# Patient Record
Sex: Female | Born: 1965
Health system: Southern US, Community
[De-identification: ages and names within clinical notes are randomized; demographics above are authoritative.]

## PROBLEM LIST (undated history)

## (undated) DIAGNOSIS — J449 Chronic obstructive pulmonary disease, unspecified: Secondary | ICD-10-CM

## (undated) DIAGNOSIS — Z720 Tobacco use: Secondary | ICD-10-CM

## (undated) DIAGNOSIS — E7800 Pure hypercholesterolemia, unspecified: Secondary | ICD-10-CM

## (undated) DIAGNOSIS — E039 Hypothyroidism, unspecified: Secondary | ICD-10-CM

## (undated) DIAGNOSIS — I10 Essential (primary) hypertension: Secondary | ICD-10-CM

## (undated) DIAGNOSIS — M1990 Unspecified osteoarthritis, unspecified site: Secondary | ICD-10-CM

## (undated) DIAGNOSIS — R079 Chest pain, unspecified: Secondary | ICD-10-CM

## (undated) DIAGNOSIS — R0789 Other chest pain: Secondary | ICD-10-CM

## (undated) HISTORY — DX: Pure hypercholesterolemia, unspecified: E78.00

## (undated) HISTORY — DX: Other chest pain: R07.89

## (undated) HISTORY — DX: Chest pain, unspecified: R07.9

## (undated) HISTORY — PX: OTHER SURGICAL HISTORY: SHX169

---

## 2002-10-23 ENCOUNTER — Encounter: Payer: Self-pay | Admitting: Family Medicine

## 2002-10-23 ENCOUNTER — Ambulatory Visit (HOSPITAL_COMMUNITY): Admission: RE | Admit: 2002-10-23 | Discharge: 2002-10-23 | Payer: Self-pay | Admitting: Family Medicine

## 2004-12-07 ENCOUNTER — Ambulatory Visit (HOSPITAL_COMMUNITY): Admission: RE | Admit: 2004-12-07 | Discharge: 2004-12-07 | Payer: Self-pay | Admitting: Family Medicine

## 2004-12-08 ENCOUNTER — Ambulatory Visit (HOSPITAL_COMMUNITY): Admission: RE | Admit: 2004-12-08 | Discharge: 2004-12-08 | Payer: Self-pay | Admitting: Family Medicine

## 2007-08-01 ENCOUNTER — Ambulatory Visit (HOSPITAL_COMMUNITY): Admission: RE | Admit: 2007-08-01 | Discharge: 2007-08-01 | Payer: Self-pay | Admitting: Family Medicine

## 2007-08-02 ENCOUNTER — Ambulatory Visit (HOSPITAL_COMMUNITY): Admission: RE | Admit: 2007-08-02 | Discharge: 2007-08-02 | Payer: Self-pay | Admitting: Family Medicine

## 2008-08-07 ENCOUNTER — Ambulatory Visit (HOSPITAL_COMMUNITY): Admission: RE | Admit: 2008-08-07 | Discharge: 2008-08-07 | Payer: Self-pay | Admitting: Family Medicine

## 2009-01-27 ENCOUNTER — Other Ambulatory Visit: Admission: RE | Admit: 2009-01-27 | Discharge: 2009-01-27 | Payer: Self-pay | Admitting: Obstetrics and Gynecology

## 2009-02-03 ENCOUNTER — Ambulatory Visit (HOSPITAL_COMMUNITY): Admission: RE | Admit: 2009-02-03 | Discharge: 2009-02-03 | Payer: Self-pay | Admitting: Family Medicine

## 2009-02-17 ENCOUNTER — Ambulatory Visit (HOSPITAL_COMMUNITY): Admission: RE | Admit: 2009-02-17 | Discharge: 2009-02-17 | Payer: Self-pay | Admitting: Family Medicine

## 2009-05-28 ENCOUNTER — Emergency Department (HOSPITAL_COMMUNITY): Admission: EM | Admit: 2009-05-28 | Discharge: 2009-05-28 | Payer: Self-pay | Admitting: Emergency Medicine

## 2009-10-18 ENCOUNTER — Ambulatory Visit (HOSPITAL_COMMUNITY): Admission: RE | Admit: 2009-10-18 | Discharge: 2009-10-18 | Payer: Self-pay | Admitting: Family Medicine

## 2010-06-22 LAB — COMPREHENSIVE METABOLIC PANEL
ALT: 15 U/L (ref 0–35)
Alkaline Phosphatase: 52 U/L (ref 39–117)
BUN: 7 mg/dL (ref 6–23)
CO2: 27 mEq/L (ref 19–32)
Chloride: 105 mEq/L (ref 96–112)
Glucose, Bld: 96 mg/dL (ref 70–99)
Potassium: 4 mEq/L (ref 3.5–5.1)
Total Bilirubin: 0.4 mg/dL (ref 0.3–1.2)

## 2010-06-22 LAB — URINALYSIS, ROUTINE W REFLEX MICROSCOPIC
Glucose, UA: NEGATIVE mg/dL
Ketones, ur: NEGATIVE mg/dL
Specific Gravity, Urine: <1.005 — ABNORMAL LOW (ref 1.005–1.030)
pH: 7 (ref 5.0–8.0)

## 2010-06-22 LAB — CBC
HCT: 44.9 % (ref 36.0–46.0)
Hemoglobin: 15.3 g/dL — ABNORMAL HIGH (ref 12.0–15.0)
RBC: 5.04 MIL/uL (ref 3.87–5.11)
RDW: 13.8 % (ref 11.5–15.5)
WBC: 8.6 10*3/uL (ref 4.0–10.5)

## 2010-06-22 LAB — WET PREP, GENITAL: WBC, Wet Prep HPF POC: NONE SEEN

## 2010-06-22 LAB — LIPASE, BLOOD: Lipase: 23 U/L (ref 11–59)

## 2010-06-22 LAB — PREGNANCY, URINE: Preg Test, Ur: NEGATIVE

## 2011-03-11 ENCOUNTER — Emergency Department (HOSPITAL_COMMUNITY): Payer: BC Managed Care – PPO

## 2011-03-11 ENCOUNTER — Emergency Department (HOSPITAL_COMMUNITY)
Admission: EM | Admit: 2011-03-11 | Discharge: 2011-03-11 | Disposition: A | Discharge status: Home / Self Care | Payer: BC Managed Care – PPO | Attending: Emergency Medicine | Admitting: Emergency Medicine

## 2011-03-11 ENCOUNTER — Encounter: Payer: Self-pay | Admitting: *Deleted

## 2011-03-11 DIAGNOSIS — J449 Chronic obstructive pulmonary disease, unspecified: Secondary | ICD-10-CM | POA: Insufficient documentation

## 2011-03-11 DIAGNOSIS — R112 Nausea with vomiting, unspecified: Secondary | ICD-10-CM | POA: Insufficient documentation

## 2011-03-11 DIAGNOSIS — I10 Essential (primary) hypertension: Secondary | ICD-10-CM | POA: Insufficient documentation

## 2011-03-11 DIAGNOSIS — R51 Headache: Secondary | ICD-10-CM | POA: Insufficient documentation

## 2011-03-11 DIAGNOSIS — E079 Disorder of thyroid, unspecified: Secondary | ICD-10-CM | POA: Insufficient documentation

## 2011-03-11 DIAGNOSIS — F17200 Nicotine dependence, unspecified, uncomplicated: Secondary | ICD-10-CM | POA: Insufficient documentation

## 2011-03-11 DIAGNOSIS — J4489 Other specified chronic obstructive pulmonary disease: Secondary | ICD-10-CM | POA: Insufficient documentation

## 2011-03-11 HISTORY — DX: Essential (primary) hypertension: I10

## 2011-03-11 HISTORY — DX: Chronic obstructive pulmonary disease, unspecified: J44.9

## 2011-03-11 LAB — DIFFERENTIAL
Basophils Absolute: 0 10*3/uL (ref 0.0–0.1)
Eosinophils Relative: 0 % (ref 0–5)
Lymphocytes Relative: 20 % (ref 12–46)
Lymphs Abs: 1.7 10*3/uL (ref 0.7–4.0)
Monocytes Absolute: 0.5 10*3/uL (ref 0.1–1.0)
Neutro Abs: 6.2 10*3/uL (ref 1.7–7.7)

## 2011-03-11 LAB — CBC
HCT: 44.9 % (ref 36.0–46.0)
MCV: 89.4 fL (ref 78.0–100.0)
Platelets: 190 10*3/uL (ref 150–400)
RBC: 5.02 MIL/uL (ref 3.87–5.11)
WBC: 8.5 10*3/uL (ref 4.0–10.5)

## 2011-03-11 LAB — URINALYSIS, ROUTINE W REFLEX MICROSCOPIC
Glucose, UA: NEGATIVE mg/dL
Hgb urine dipstick: NEGATIVE
Leukocytes, UA: NEGATIVE
Protein, ur: NEGATIVE mg/dL
pH: 8 (ref 5.0–8.0)

## 2011-03-11 LAB — BASIC METABOLIC PANEL
CO2: 31 mEq/L (ref 19–32)
Calcium: 10 mg/dL (ref 8.4–10.5)
Chloride: 98 mEq/L (ref 96–112)
Glucose, Bld: 98 mg/dL (ref 70–99)
Sodium: 137 mEq/L (ref 135–145)

## 2011-03-11 MED ORDER — SODIUM CHLORIDE 0.9 % IV BOLUS (SEPSIS)
1000.0000 mL | Freq: Once | INTRAVENOUS | Status: AC
  Administered 2011-03-11: 1000 mL via INTRAVENOUS

## 2011-03-11 MED ORDER — ONDANSETRON HCL 4 MG/2ML IJ SOLN
4.0000 mg | Freq: Once | INTRAMUSCULAR | Status: AC
Start: 2011-03-11 — End: 2011-03-11
  Administered 2011-03-11: 4 mg via INTRAVENOUS
  Filled 2011-03-11: qty 2

## 2011-03-11 MED ORDER — PANTOPRAZOLE SODIUM 40 MG IV SOLR
40.0000 mg | Freq: Once | INTRAVENOUS | Status: AC
  Administered 2011-03-11: 40 mg via INTRAVENOUS
  Filled 2011-03-11: qty 40

## 2011-03-11 MED ORDER — PROMETHAZINE HCL 25 MG PO TABS: 12.5000 mg | ORAL_TABLET | Freq: Four times a day (QID) | ORAL | Status: AC | PRN

## 2011-03-11 NOTE — ED Notes (Signed)
Pt tolerating p.o intake with no nausea.

## 2011-03-11 NOTE — ED Notes (Signed)
Pt tolerating ice chips.  Reports relief of nausea.  Sprite given.

## 2011-03-11 NOTE — ED Provider Notes (Signed)
History     CSN: [id number] Arrival date & time: 03/11/2011  7:51 PM   First MD Initiated Contact with Patient 03/11/11 2007      Chief Complaint  Patient presents with  . Headache  . Nausea  . Emesis    (Consider location/radiation/quality/duration/timing/severity/associated sxs/prior treatment) HPI Comments: Amanda York is a 45 y.o. female who presents to the Emergency Department complaining of nausea and vomiting that began this morning and has continued all day. She has been unable to keep any fluids or food down. She has a similar illness a week ago which lasted 24 hours and was associated with diarrhea. The illness today was not associated with diarrhea, or abdominal pain. She denies fever, chills, chest pain, cough.She has taken no medicines.  Patient is a 45 y.o. female presenting with headaches and vomiting.  Headache  Associated symptoms include vomiting.  Emesis  Associated symptoms include headaches.    Past Medical History  Diagnosis Date  . COPD (chronic obstructive pulmonary disease)   . Hypertension   . Thyroid disease     History reviewed. No pertinent past surgical history.  History reviewed. No pertinent family history.  History  Substance Use Topics  . Smoking status: Current Everyday Smoker -- 1.0 packs/day    Types: Cigarettes  . Smokeless tobacco: Not on file  . Alcohol Use: No    OB History    Grav Para Term Preterm Abortions TAB SAB Ect Mult Living                  Review of Systems  Gastrointestinal: Positive for vomiting.  Neurological: Positive for headaches.  10 Systems reviewed and are negative for acute change except as noted in the HPI.  Allergies  Review of patient's allergies indicates not on file.  Home Medications  No current outpatient prescriptions on file.  BP 119/69  Pulse 92  Temp(Src) 98 F (36.7 C) (Oral)  Resp 18  Ht 5\' 2"  (1.575 m)  Wt 134 lb (60.782 kg)  BMI 24.51 kg/m2  SpO2 98%  Physical Exam    Nursing note and vitals reviewed. Constitutional: She is oriented to person, place, and time. She appears well-developed and well-nourished.  HENT:  Head: Normocephalic and atraumatic.  Mouth/Throat: Oropharynx is clear and moist.  Eyes: Conjunctivae and EOM are normal.  Neck: Normal range of motion.  Cardiovascular: Normal rate, normal heart sounds and intact distal pulses.   Pulmonary/Chest: Effort normal and breath sounds normal.  Abdominal: Soft. Bowel sounds are normal. She exhibits no distension. There is no tenderness. There is no rebound and no guarding.  Musculoskeletal: Normal range of motion.  Neurological: She is alert and oriented to person, place, and time. She has normal reflexes.  Skin: Skin is warm and dry.    ED Course  Procedures (including critical care time)  Results for orders placed during the hospital encounter of 03/11/11  CBC      Component Value Range   WBC 8.5  4.0 - 10.5 (K/uL)   RBC 5.02  3.87 - 5.11 (MIL/uL)   Hemoglobin 15.2 (*) 12.0 - 15.0 (g/dL)   HCT 40.9  81.1 - 91.4 (%)   MCV 89.4  78.0 - 100.0 (fL)   MCH 30.3  26.0 - 34.0 (pg)   MCHC 33.9  30.0 - 36.0 (g/dL)   RDW 78.2  95.6 - 21.3 (%)   Platelets 190  150 - 400 (K/uL)  DIFFERENTIAL      Component Value Range  Neutrophils Relative 73  43 - 77 (%)   Neutro Abs 6.2  1.7 - 7.7 (K/uL)   Lymphocytes Relative 20  12 - 46 (%)   Lymphs Abs 1.7  0.7 - 4.0 (K/uL)   Monocytes Relative 6  3 - 12 (%)   Monocytes Absolute 0.5  0.1 - 1.0 (K/uL)   Eosinophils Relative 0  0 - 5 (%)   Eosinophils Absolute 0.0  0.0 - 0.7 (K/uL)   Basophils Relative 1  0 - 1 (%)   Basophils Absolute 0.0  0.0 - 0.1 (K/uL)  BASIC METABOLIC PANEL      Component Value Range   Sodium 137  135 - 145 (mEq/L)   Potassium 3.8  3.5 - 5.1 (mEq/L)   Chloride 98  96 - 112 (mEq/L)   CO2 31  19 - 32 (mEq/L)   Glucose, Bld 98  70 - 99 (mg/dL)   BUN 6  6 - 23 (mg/dL)   Creatinine, Ser 1.61  0.50 - 1.10 (mg/dL)   Calcium 09.6   8.4 - 10.5 (mg/dL)   GFR calc non Af Amer >90  >90 (mL/min)   GFR calc Af Amer >90  >90 (mL/min)  URINALYSIS, ROUTINE W REFLEX MICROSCOPIC      Component Value Range   Color, Urine YELLOW  YELLOW    APPearance CLEAR  CLEAR    Specific Gravity, Urine 1.015  1.005 - 1.030    pH 8.0  5.0 - 8.0    Glucose, UA NEGATIVE  NEGATIVE (mg/dL)   Hgb urine dipstick NEGATIVE  NEGATIVE    Bilirubin Urine NEGATIVE  NEGATIVE    Ketones, ur NEGATIVE  NEGATIVE (mg/dL)   Protein, ur NEGATIVE  NEGATIVE (mg/dL)   Urobilinogen, UA 0.2  0.0 - 1.0 (mg/dL)   Nitrite NEGATIVE  NEGATIVE    Leukocytes, UA NEGATIVE  NEGATIVE    Dg Abd Acute W/chest  03/11/2011  *RADIOLOGY REPORT*  Clinical Data: Vomiting.  Cough.  ACUTE ABDOMEN SERIES (ABDOMEN 2 VIEW & CHEST 1 VIEW)  Comparison: CT abdomen pelvis 05/28/2009 at [facility]. Two-view chest x-ray 08/07/2008 at [facility].  Findings: The heart size is normal.  Emphysema is again noted.  No focal airspace disease is evident.  Supine and upright views the abdomen demonstrate a nonspecific bowel gas pattern.  There is no evidence for obstruction or free air.  The axial skeleton is unremarkable.  IMPRESSION:  1.  No acute abnormality of the abdomen or chest. 2.  Emphysema.  Original Report Authenticated By: Jamesetta Orleans. MATTERN, M.D.   2145 Nausea is relieved. No further vomiting. Patient  has taken PO ice chips and is trying a PO fluid challenge.     MDM  Patient with nausea and vomiting x one day without fever, chills, abdominal pain. Labs are unremarkable, acute abdomen series without acute findings. Patient received IVF, antiemetic with improvement.Pt feels improved after observation and/or treatment in ED.Pt stable in ED with no significant deterioration in condition.The patient appears reasonably screened and/or stabilized for discharge and I doubt any other medical condition or other [facility] requiring further screening, evaluation, or treatment in the  ED at this time prior to discharge.  MDM Reviewed: nursing note and vitals Interpretation: labs and x-ray           Nicoletta Dress. Colon Branch, MD 03/11/11 2152

## 2011-03-11 NOTE — ED Notes (Addendum)
Pt reports nausea and vomiting beginning today.  States she has been unable to tolerate any p.o intake. Reports the n/v have resulted in a headache as well.

## 2011-03-11 NOTE — ED Notes (Signed)
C/o HA with N/V today, recently sick with virus

## 2011-05-04 ENCOUNTER — Other Ambulatory Visit (HOSPITAL_COMMUNITY): Payer: Self-pay | Admitting: Family Medicine

## 2011-05-04 DIAGNOSIS — Z0000 Encounter for general adult medical examination without abnormal findings: Secondary | ICD-10-CM

## 2011-05-04 DIAGNOSIS — Z139 Encounter for screening, unspecified: Secondary | ICD-10-CM

## 2011-05-08 ENCOUNTER — Ambulatory Visit (HOSPITAL_COMMUNITY)
Admission: RE | Admit: 2011-05-08 | Discharge: 2011-05-08 | Disposition: A | Discharge status: Home / Self Care | Payer: 59 | Source: Ambulatory Visit | Attending: Family Medicine | Admitting: Family Medicine

## 2011-05-08 DIAGNOSIS — Z0000 Encounter for general adult medical examination without abnormal findings: Secondary | ICD-10-CM

## 2011-05-08 DIAGNOSIS — Z1231 Encounter for screening mammogram for malignant neoplasm of breast: Secondary | ICD-10-CM | POA: Insufficient documentation

## 2011-05-08 DIAGNOSIS — Z139 Encounter for screening, unspecified: Secondary | ICD-10-CM

## 2011-05-11 ENCOUNTER — Other Ambulatory Visit (HOSPITAL_COMMUNITY)
Admission: RE | Admit: 2011-05-11 | Discharge: 2011-05-11 | Disposition: A | Discharge status: Home / Self Care | Payer: 59 | Source: Ambulatory Visit | Attending: Obstetrics and Gynecology | Admitting: Obstetrics and Gynecology

## 2011-05-11 ENCOUNTER — Other Ambulatory Visit: Payer: Self-pay | Admitting: Adult Health

## 2011-05-11 DIAGNOSIS — Z01419 Encounter for gynecological examination (general) (routine) without abnormal findings: Secondary | ICD-10-CM | POA: Insufficient documentation

## 2011-05-11 DIAGNOSIS — E041 Nontoxic single thyroid nodule: Secondary | ICD-10-CM

## 2011-05-15 ENCOUNTER — Ambulatory Visit (HOSPITAL_COMMUNITY)
Admission: RE | Admit: 2011-05-15 | Discharge: 2011-05-15 | Disposition: A | Discharge status: Home / Self Care | Payer: 59 | Source: Ambulatory Visit | Attending: Adult Health | Admitting: Adult Health

## 2011-05-15 DIAGNOSIS — E049 Nontoxic goiter, unspecified: Secondary | ICD-10-CM | POA: Insufficient documentation

## 2011-05-15 DIAGNOSIS — E041 Nontoxic single thyroid nodule: Secondary | ICD-10-CM

## 2011-05-15 DIAGNOSIS — Z09 Encounter for follow-up examination after completed treatment for conditions other than malignant neoplasm: Secondary | ICD-10-CM | POA: Insufficient documentation

## 2013-07-09 ENCOUNTER — Other Ambulatory Visit (HOSPITAL_COMMUNITY): Payer: Self-pay | Admitting: Family Medicine

## 2013-07-09 DIAGNOSIS — Z139 Encounter for screening, unspecified: Secondary | ICD-10-CM

## 2013-07-14 ENCOUNTER — Ambulatory Visit (HOSPITAL_COMMUNITY): Payer: 59

## 2013-08-13 ENCOUNTER — Ambulatory Visit (HOSPITAL_COMMUNITY): Payer: 59

## 2013-08-14 ENCOUNTER — Ambulatory Visit (HOSPITAL_COMMUNITY)
Admission: RE | Admit: 2013-08-14 | Discharge: 2013-08-14 | Disposition: A | Discharge status: Home / Self Care | Payer: BC Managed Care – PPO | Source: Ambulatory Visit | Attending: Family Medicine | Admitting: Family Medicine

## 2013-08-14 DIAGNOSIS — Z139 Encounter for screening, unspecified: Secondary | ICD-10-CM

## 2013-08-14 DIAGNOSIS — Z1231 Encounter for screening mammogram for malignant neoplasm of breast: Secondary | ICD-10-CM | POA: Insufficient documentation

## 2014-10-19 ENCOUNTER — Emergency Department (HOSPITAL_COMMUNITY): Payer: BLUE CROSS/BLUE SHIELD

## 2014-10-19 ENCOUNTER — Encounter (HOSPITAL_COMMUNITY): Payer: Self-pay | Admitting: *Deleted

## 2014-10-19 ENCOUNTER — Observation Stay (HOSPITAL_COMMUNITY)
Admission: EM | Admit: 2014-10-19 | Discharge: 2014-10-20 | Disposition: A | Discharge status: Home / Self Care | Payer: BLUE CROSS/BLUE SHIELD | Attending: Internal Medicine | Admitting: Internal Medicine

## 2014-10-19 DIAGNOSIS — Z720 Tobacco use: Secondary | ICD-10-CM | POA: Diagnosis not present

## 2014-10-19 DIAGNOSIS — J441 Chronic obstructive pulmonary disease with (acute) exacerbation: Secondary | ICD-10-CM | POA: Diagnosis not present

## 2014-10-19 DIAGNOSIS — R0789 Other chest pain: Secondary | ICD-10-CM | POA: Diagnosis present

## 2014-10-19 DIAGNOSIS — J449 Chronic obstructive pulmonary disease, unspecified: Secondary | ICD-10-CM | POA: Diagnosis present

## 2014-10-19 DIAGNOSIS — R079 Chest pain, unspecified: Secondary | ICD-10-CM

## 2014-10-19 DIAGNOSIS — Z79899 Other long term (current) drug therapy: Secondary | ICD-10-CM | POA: Diagnosis not present

## 2014-10-19 DIAGNOSIS — J432 Centrilobular emphysema: Secondary | ICD-10-CM | POA: Diagnosis present

## 2014-10-19 DIAGNOSIS — E079 Disorder of thyroid, unspecified: Secondary | ICD-10-CM | POA: Diagnosis not present

## 2014-10-19 DIAGNOSIS — I10 Essential (primary) hypertension: Secondary | ICD-10-CM | POA: Diagnosis present

## 2014-10-19 HISTORY — DX: Tobacco use: Z72.0

## 2014-10-19 HISTORY — DX: Hypothyroidism, unspecified: E03.9

## 2014-10-19 HISTORY — DX: Chest pain, unspecified: R07.9

## 2014-10-19 LAB — BASIC METABOLIC PANEL
ANION GAP: 9 (ref 5–15)
BUN: 5 mg/dL — AB (ref 6–20)
CHLORIDE: 99 mmol/L — AB (ref 101–111)
CO2: 27 mmol/L (ref 22–32)
Calcium: 9.1 mg/dL (ref 8.9–10.3)
Creatinine, Ser: 0.61 mg/dL (ref 0.44–1.00)
GFR calc non Af Amer: >60 mL/min (ref >60)
Glucose, Bld: 90 mg/dL (ref 65–99)
POTASSIUM: 4 mmol/L (ref 3.5–5.1)
Sodium: 135 mmol/L (ref 135–145)

## 2014-10-19 LAB — TROPONIN I
Troponin I: <0.03 ng/mL (ref <0.031)
Troponin I: <0.03 ng/mL (ref <0.031)
Troponin I: <0.03 ng/mL (ref <0.031)

## 2014-10-19 LAB — CBC
HCT: 44.8 % (ref 36.0–46.0)
Hemoglobin: 15.3 g/dL — ABNORMAL HIGH (ref 12.0–15.0)
MCH: 29.9 pg (ref 26.0–34.0)
MCHC: 34.2 g/dL (ref 30.0–36.0)
MCV: 87.7 fL (ref 78.0–100.0)
Platelets: 226 10*3/uL (ref 150–400)
RBC: 5.11 MIL/uL (ref 3.87–5.11)
RDW: 14 % (ref 11.5–15.5)
WBC: 6.4 10*3/uL (ref 4.0–10.5)

## 2014-10-19 MED ORDER — TIOTROPIUM BROMIDE MONOHYDRATE 18 MCG IN CAPS
18.0000 ug | ORAL_CAPSULE | Freq: Every day | RESPIRATORY_TRACT | Status: DC
  Filled 2014-10-19: qty 5

## 2014-10-19 MED ORDER — ASPIRIN 81 MG PO CHEW
324.0000 mg | CHEWABLE_TABLET | Freq: Once | ORAL | Status: AC
  Administered 2014-10-19: 324 mg via ORAL
  Filled 2014-10-19: qty 4

## 2014-10-19 MED ORDER — LEVOTHYROXINE SODIUM 25 MCG PO TABS
25.0000 ug | ORAL_TABLET | Freq: Every day | ORAL | Status: DC
  Administered 2014-10-20: 25 ug via ORAL
  Filled 2014-10-19: qty 1

## 2014-10-19 MED ORDER — MORPHINE SULFATE 2 MG/ML IJ SOLN: 2.0000 mg | INTRAMUSCULAR | Status: DC | PRN

## 2014-10-19 MED ORDER — ACETAMINOPHEN 325 MG PO TABS: 650.0000 mg | ORAL_TABLET | ORAL | Status: DC | PRN

## 2014-10-19 MED ORDER — LISINOPRIL 10 MG PO TABS
10.0000 mg | ORAL_TABLET | Freq: Every day | ORAL | Status: DC
  Administered 2014-10-20: 10 mg via ORAL
  Filled 2014-10-19: qty 1

## 2014-10-19 MED ORDER — ONDANSETRON HCL 4 MG/2ML IJ SOLN: 4.0000 mg | Freq: Four times a day (QID) | INTRAMUSCULAR | Status: DC | PRN

## 2014-10-19 MED ORDER — ENOXAPARIN SODIUM 40 MG/0.4ML ~~LOC~~ SOLN
40.0000 mg | SUBCUTANEOUS | Status: DC
  Administered 2014-10-19: 40 mg via SUBCUTANEOUS
  Filled 2014-10-19: qty 0.4

## 2014-10-19 MED ORDER — LISINOPRIL 10 MG PO TABS: 10.0000 mg | ORAL_TABLET | Freq: Every day | ORAL | Status: DC

## 2014-10-19 MED ORDER — NITROGLYCERIN 0.4 MG SL SUBL
0.4000 mg | SUBLINGUAL_TABLET | Freq: Once | SUBLINGUAL | Status: AC
  Administered 2014-10-19: 0.4 mg via SUBLINGUAL
  Filled 2014-10-19: qty 1

## 2014-10-19 MED ORDER — ENOXAPARIN SODIUM 40 MG/0.4ML ~~LOC~~ SOLN: 40.0000 mg | SUBCUTANEOUS | Status: DC

## 2014-10-19 NOTE — H&P (Signed)
[facility] History and Physical  [name] LDJ:[id number] DOB: [birth year] DOA: 10/19/2014  Referring physician: ER PCP: No PCP Per Patient   Chief Complaint: Chest pain  HPI: Amanda York is a 49 y.o. female  This is a 49 year old lady who describes chest pain for the last 4 days. She says that it is constantly present and does not seem to be intermittent in nature. She has been short of breath with the chest pain but she also has COPD and she cannot differentiate which one is causing the problem. There is no nausea or diaphoresis associated with symptoms. The pain does not radiate. She does have a significant family history of early coronary artery disease. Her father died at the age of 2 from heart attack and she has a 64 year old sister who ordered a has heart disease. She is a smoker and she is hypertensive. She is not diabetic. She is now being admitted for further investigation.   Review of Systems:  Apart from symptoms above, all systems negative.  Past Medical History  Diagnosis Date  . COPD (chronic obstructive pulmonary disease)   . Hypertension   . Thyroid disease    History reviewed. No pertinent past surgical history. Social History:  reports that she has been smoking Cigarettes.  She has been smoking about 1.00 pack per day. She does not have any smokeless tobacco history on file. She reports that she does not drink alcohol or use illicit drugs.  No Known Allergies   Family history: Father died from myocardial infarction and sister has heart disease.   Prior to Admission medications   Medication Sig Start Date End Date Taking? Authorizing Provider  levothyroxine (SYNTHROID, LEVOTHROID) 25 MCG tablet  10/14/14  Yes Historical Provider, MD  lisinopril (PRINIVIL,ZESTRIL) 10 MG tablet  09/08/14  Yes Historical Provider, MD  tiotropium (SPIRIVA) 18 MCG inhalation capsule Place 18 mcg into inhaler and inhale daily.   Yes Historical Provider, MD   Physical  Exam: Filed Vitals:   10/19/14 1459 10/19/14 1500 10/19/14 1530 10/19/14 1600  BP:  131/79 134/95 128/72  Pulse:  68 84 67  Temp:      TempSrc:      Resp:  14 14 14   Height:      Weight:      SpO2: 100% 99% 100% 98%    Wt Readings from Last 3 Encounters:  10/19/14 55.084 kg (121 lb 7 oz)  03/11/11 60.782 kg (134 lb)    General:  Appears calm and comfortable. She does not appear to be in pain. Eyes: PERRL, normal lids, irises & conjunctiva ENT: grossly normal hearing, lips & tongue Neck: no LAD, masses or thyromegaly Cardiovascular: RRR, no m/r/g. No LE edema. Telemetry: SR, no arrhythmias  Respiratory: CTA bilaterally, no w/r/r. Normal respiratory effort. Abdomen: soft, ntnd Skin: no rash or induration seen on limited exam Musculoskeletal: grossly normal tone BUE/BLE Psychiatric: grossly normal mood and affect, speech fluent and appropriate Neurologic: grossly non-focal.          Labs on Admission:  Basic Metabolic Panel:  Recent Labs Lab 10/19/14 1311  NA 135  K 4.0  CL 99*  CO2 27  GLUCOSE 90  BUN 5*  CREATININE 0.61  CALCIUM 9.1   Liver Function Tests: No results for input(s): AST, ALT, ALKPHOS, BILITOT, PROT, ALBUMIN in the last 168 hours. No results for input(s): LIPASE, AMYLASE in the last 168 hours. No results for input(s): AMMONIA in the last 168 hours. CBC:  Recent  Labs Lab 10/19/14 1311  WBC 6.4  HGB 15.3*  HCT 44.8  MCV 87.7  PLT 226   Cardiac Enzymes:  Recent Labs Lab 10/19/14 1311  TROPONINI <0.03    BNP (last 3 results) No results for input(s): BNP in the last 8760 hours.  ProBNP (last 3 results) No results for input(s): PROBNP in the last 8760 hours.  CBG: No results for input(s): GLUCAP in the last 168 hours.  Radiological Exams on Admission: Dg Chest 2 View  10/19/2014   CLINICAL DATA:  Left side chest pain for 4 days. Shortness of breath. Chronic smoker, cough. History of COPD, hypertension.  EXAM: CHEST  2 VIEW   COMPARISON:  08/07/2008  FINDINGS: There is hyperinflation of the lungs compatible with COPD. Heart and mediastinal contours are within normal limits. No focal opacities or effusions. No acute bony abnormality.  IMPRESSION: COPD.  No active disease.   Electronically Signed   By: Rolm Baptise M.D.   On: 10/19/2014 13:11    EKG: Independently reviewed. Sinus rhythm without any acute ST-T wave changes.  Assessment/Plan   1. Chest pain. The etiology is not clear. She clearly has multiple risk factors for coronary artery disease including tobacco abuse, hypertension and strong family history of early coronary artery disease. We will cycle cardiac enzymes. I will request cardiology consultation. She will likely require stress test.  Further recommendations will depend on patient's hospital progress.  Code Status: Full code.   DVT Prophylaxis: Lovenox.  Family Communication: I discussed the plan with the patient at the bedside.   Disposition Plan: Home when medically stable.  Time spent: 45 minutes.  Doree Albee [facility] Pager [phone].

## 2014-10-19 NOTE — ED Provider Notes (Signed)
CSN: [id number]     Arrival date & time 10/19/14  1246 History   First MD Initiated Contact with Patient 10/19/14 1457     Chief Complaint  Patient presents with  . Chest Pain     (Consider location/radiation/quality/duration/timing/severity/associated sxs/prior Treatment) HPI Comments: Patient presents to the emergency department for evaluation of chest pain. Patient reports that she has been experiencing pain in the left side of her chest for 4 days. She reports that it has never really let up, but does get better or worse at times. The only thing she has identified that makes it worse is leaning forward. She has been short of breath, but does have a history of COPD, has been attributing her breathing problems to her COPD. She has not had nausea or diaphoresis associated with the symptoms. Patient reports that pain was worse today, went to urgent care and was referred to the emergency department.  Patient reports a significant family history of heart disease. Nearly all the members of her family have had heart disease. Her father died at age 60 from a heart attack. Her sister, who is 45, already has heart disease. She is a smoker and has hypertension. She reports that her cholesterol has been normal. She is not obese. She is not a diabetic.  Patient is a 49 y.o. female presenting with chest pain.  Chest Pain Associated symptoms: shortness of breath     Past Medical History  Diagnosis Date  . COPD (chronic obstructive pulmonary disease)   . Hypertension   . Thyroid disease    History reviewed. No pertinent past surgical history. History reviewed. No pertinent family history. History  Substance Use Topics  . Smoking status: Current Every Day Smoker -- 1.00 packs/day    Types: Cigarettes  . Smokeless tobacco: Not on file  . Alcohol Use: No   OB History    No data available     Review of Systems  Respiratory: Positive for shortness of breath.   Cardiovascular: Positive for chest  pain.  All other systems reviewed and are negative.     Allergies  Review of patient's allergies indicates no known allergies.  Home Medications   Prior to Admission medications   Medication Sig Start Date End Date Taking? Authorizing Provider  levothyroxine (SYNTHROID, LEVOTHROID) 25 MCG tablet  10/14/14  Yes Historical Provider, MD  lisinopril (PRINIVIL,ZESTRIL) 10 MG tablet  09/08/14  Yes Historical Provider, MD  tiotropium (SPIRIVA) 18 MCG inhalation capsule Place 18 mcg into inhaler and inhale daily.   Yes Historical Provider, MD   BP 115/73 mmHg  Pulse 84  Temp(Src) 98.6 F (37 C) (Oral)  Resp 17  Ht 5\' 1"  (1.549 m)  Wt 121 lb 7 oz (55.084 kg)  BMI 22.96 kg/m2  SpO2 100% Physical Exam  Constitutional: She is oriented to person, place, and time. She appears well-developed and well-nourished. No distress.  HENT:  Head: Normocephalic and atraumatic.  Right Ear: Hearing normal.  Left Ear: Hearing normal.  Nose: Nose normal.  Mouth/Throat: Oropharynx is clear and moist and mucous membranes are normal.  Eyes: Conjunctivae and EOM are normal. Pupils are equal, round, and reactive to light.  Neck: Normal range of motion. Neck supple.  Cardiovascular: Regular rhythm, S1 normal and S2 normal.  Exam reveals no gallop and no friction rub.   No murmur heard. Pulmonary/Chest: Effort normal and breath sounds normal. No respiratory distress. She exhibits no tenderness.  Abdominal: Soft. Normal appearance and bowel sounds are normal. There  is no hepatosplenomegaly. There is no tenderness. There is no rebound, no guarding, no tenderness at McBurney's point and negative Murphy's sign. No hernia.  Musculoskeletal: Normal range of motion.  Neurological: She is alert and oriented to person, place, and time. She has normal strength. No cranial nerve deficit or sensory deficit. Coordination normal. GCS eye subscore is 4. GCS verbal subscore is 5. GCS motor subscore is 6.  Skin: Skin is warm, dry  and intact. No rash noted. No cyanosis.  Psychiatric: She has a normal mood and affect. Her speech is normal and behavior is normal. Thought content normal.  Nursing note and vitals reviewed.   ED Course  Procedures (including critical care time) Labs Review Labs Reviewed  BASIC METABOLIC PANEL - Abnormal; Notable for the following:    Chloride 99 (*)    BUN 5 (*)    All other components within normal limits  CBC - Abnormal; Notable for the following:    Hemoglobin 15.3 (*)    All other components within normal limits  TROPONIN I    Imaging Review Dg Chest 2 View  10/19/2014   CLINICAL DATA:  Left side chest pain for 4 days. Shortness of breath. Chronic smoker, cough. History of COPD, hypertension.  EXAM: CHEST  2 VIEW  COMPARISON:  08/07/2008  FINDINGS: There is hyperinflation of the lungs compatible with COPD. Heart and mediastinal contours are within normal limits. No focal opacities or effusions. No acute bony abnormality.  IMPRESSION: COPD.  No active disease.   Electronically Signed   By: Rolm Baptise M.D.   On: 10/19/2014 13:11     EKG Interpretation   Date/Time:  Monday October 19 2014 12:48:40 EDT Ventricular Rate:  108 PR Interval:  130 QRS Duration: 72 QT Interval:  330 QTC Calculation: 442 R Axis:   75 Text Interpretation:  Sinus tachycardia Possible Left atrial enlargement  Borderline ECG No previous tracing Confirmed by POLLINA  MD, CHRISTOPHER  [id number]) on 10/19/2014 3:21:43 PM      MDM   Final diagnoses:  None  Chest Pain  Patient presents to the emergency department for evaluation of chest pain. Patient reports pain has been ongoing for 4 days. It is reassuring that her EKG does not show ischemic changes and she has a normal troponin after 4 days of pain. She does, however, have multiple cardiac risk factors. Her HEART Pathway score is 4, moderate risk which recommends admission.  Patient will be observed on telemetry by hospitalist  service.    Orpah Greek, MD 10/19/14 [phone]

## 2014-10-19 NOTE — ED Notes (Signed)
Patient reports chest pain in left chest x 4 days. Sent here from [facility] urgent care.

## 2014-10-20 ENCOUNTER — Other Ambulatory Visit: Payer: Self-pay | Admitting: Physician Assistant

## 2014-10-20 ENCOUNTER — Encounter (HOSPITAL_COMMUNITY): Payer: Self-pay | Admitting: Physician Assistant

## 2014-10-20 ENCOUNTER — Observation Stay (HOSPITAL_COMMUNITY): Payer: BLUE CROSS/BLUE SHIELD

## 2014-10-20 DIAGNOSIS — J432 Centrilobular emphysema: Secondary | ICD-10-CM | POA: Diagnosis present

## 2014-10-20 DIAGNOSIS — R0789 Other chest pain: Secondary | ICD-10-CM

## 2014-10-20 DIAGNOSIS — R079 Chest pain, unspecified: Secondary | ICD-10-CM

## 2014-10-20 DIAGNOSIS — J439 Emphysema, unspecified: Secondary | ICD-10-CM | POA: Diagnosis not present

## 2014-10-20 DIAGNOSIS — I10 Essential (primary) hypertension: Secondary | ICD-10-CM

## 2014-10-20 DIAGNOSIS — Z720 Tobacco use: Secondary | ICD-10-CM | POA: Diagnosis present

## 2014-10-20 DIAGNOSIS — J449 Chronic obstructive pulmonary disease, unspecified: Secondary | ICD-10-CM | POA: Diagnosis present

## 2014-10-20 DIAGNOSIS — Z8249 Family history of ischemic heart disease and other diseases of the circulatory system: Secondary | ICD-10-CM

## 2014-10-20 HISTORY — DX: Other chest pain: R07.89

## 2014-10-20 LAB — LIPID PANEL
CHOL/HDL RATIO: 3.9 ratio
Cholesterol: 197 mg/dL (ref 0–200)
HDL: 51 mg/dL (ref >40)
LDL Cholesterol: 126 mg/dL — ABNORMAL HIGH (ref 0–99)
Triglycerides: 99 mg/dL (ref <150)
VLDL: 20 mg/dL (ref 0–40)

## 2014-10-20 MED ORDER — ASPIRIN 81 MG PO TBEC: 81.0000 mg | DELAYED_RELEASE_TABLET | Freq: Every day | ORAL | Status: DC

## 2014-10-20 MED ORDER — NICOTINE 21 MG/24HR TD PT24: 21.0000 mg | MEDICATED_PATCH | Freq: Every day | TRANSDERMAL | Status: DC

## 2014-10-20 MED ORDER — ASPIRIN EC 81 MG PO TBEC
81.0000 mg | DELAYED_RELEASE_TABLET | Freq: Every day | ORAL | Status: DC
  Administered 2014-10-20: 81 mg via ORAL
  Filled 2014-10-20 (×2): qty 1

## 2014-10-20 MED ORDER — ALBUTEROL SULFATE HFA 108 (90 BASE) MCG/ACT IN AERS: 2.0000 | INHALATION_SPRAY | Freq: Four times a day (QID) | RESPIRATORY_TRACT | Status: DC | PRN

## 2014-10-20 NOTE — Progress Notes (Signed)
F/u arranged: Stress test - Tuesday 10/27/14 - register at [facility] with Jory Sims - Friday 7/29 at 1:30pm.  Melina Copa PA-C

## 2014-10-20 NOTE — Discharge Summary (Signed)
Physician Discharge Summary  Amanda York NWG:[id number] DOB: 10-Jul-1965 DOA: 10/19/2014  PCP: Plans to follow-up at [facility] date: 10/19/2014 Discharge date: 10/20/2014  Time spent: 25 minutes  Recommendations for Outpatient Follow-up:  1. Discharge home with outpatient cardiology follow-up for stress test   Discharge Diagnoses:  Principal problem   Atypical chest pain  Active problems   Hypertension   COPD (chronic obstructive pulmonary disease)   Tobacco abuse   Discharge Condition: Fair  Diet recommendation: Low sodium  Filed Weights   10/19/14 1251 10/19/14 1901  Weight: 55.084 kg (121 lb 7 oz) 54.114 kg (119 lb 4.8 oz)    History of present illness:  Please refer to admission H&P for details, in brief, 49 year old female with history of heavy smoking, hypertension and COPD presented with acute onset of sharp left sided chest pain over her left breast, nonradiating, 8/10 in severity lasting for several minutes until she arrived to the ED. Denies similar symptoms in the past. Has significant family history of MI (father died in the 66s with MI and her sister has coronary artery disease). Reports some shortness of breath with chest pain. Denies palpitations. Denied nausea, vomiting or diaphoresis. Symptoms subsided after receiving nitrates in the ED. Patient monitor under observation overnight. Serial troponins and EKG have been unremarkable.  Hospital Course:  Atypical chest pain Appears to be musculoskeletal. Serial enzymes have been negative. Patient stable on telemetry. No further chest pain symptoms. Seen by cardiology and recommends outpatient stress test and has been scheduled. No further inpatient workup necessary at this time. I will discharge her on baby aspirin and her risk factors (hypertension, ongoing tobacco use)  COPD Reports having periodic shortness of breath and wheezing symptoms. Uses spiriva only as needed. i have instructed her to  use it on a daily basis and also will prescribe albuterol inhaler as needed. I have counseled her extensively on smoking cessation. She agrees to be on nicotine patch.  Essential hypertension Stable. Continue lisinopril.  Hypothyroidism Continue Synthroid  Patient previously followed up at [facility] but her PCP now retired. I have instructed her to make appointment with a new PCP.  Procedures:  None  Consultations:  Cardiology  Discharge Exam: Filed Vitals:   10/20/14 0603  BP: 100/62  Pulse: 65  Temp: 98.7 F (37.1 C)  Resp: 16    General: Middle aged female in no acute distress HEENT: Moist oral mucosa Chest: Clear to auscultation bilaterally CVS: Normal S1 and S2, no murmurs rub or gallop GI: Soft, nondistended, nontender, bowel sounds present Musculoskeletal musculoskeletal: Warm, no edema CNS: Alert and oriented   Discharge Instructions    Current Discharge Medication List    START taking these medications   Details  albuterol (PROVENTIL HFA;VENTOLIN HFA) 108 (90 BASE) MCG/ACT inhaler Inhale 2 puffs into the lungs every 6 (six) hours as needed for wheezing or shortness of breath. Qty: 1 Inhaler, Refills: 3    aspirin EC 81 MG EC tablet Take 1 tablet (81 mg total) by mouth daily. Qty: 30 tablet, Refills: 0    nicotine (NICODERM CQ) 21 mg/24hr patch Place 1 patch (21 mg total) onto the skin daily. Qty: 28 patch, Refills: 0      CONTINUE these medications which have NOT CHANGED   Details  levothyroxine (SYNTHROID, LEVOTHROID) 25 MCG tablet Refills: 2    lisinopril (PRINIVIL,ZESTRIL) 10 MG tablet Refills: 0    tiotropium (SPIRIVA) 18 MCG inhalation capsule Place 18 mcg into inhaler and  inhale daily.       No Known Allergies Follow-up Information    Follow up with [facility].   Why:  Stress test - 10/27/14 - register at radiology at 8am. See attached instructions at end of AVS.   Contact information:   [address] [phone]      Follow up with Jory Sims, NP.   Specialties:  Nurse Practitioner, Radiology, Cardiology   Why:  [facility] - 10/30/14 at 1:30pm Curt Bears is one of Dr. Myles Gip nurse practitioners.)   Contact information:   [place] [id number] [phone]        The results of significant diagnostics from this hospitalization (including imaging, microbiology, ancillary and laboratory) are listed below for reference.    Significant Diagnostic Studies: Dg Chest 2 View  10/19/2014   CLINICAL DATA:  Left side chest pain for 4 days. Shortness of breath. Chronic smoker, cough. History of COPD, hypertension.  EXAM: CHEST  2 VIEW  COMPARISON:  08/07/2008  FINDINGS: There is hyperinflation of the lungs compatible with COPD. Heart and mediastinal contours are within normal limits. No focal opacities or effusions. No acute bony abnormality.  IMPRESSION: COPD.  No active disease.   Electronically Signed   By: Rolm Baptise M.D.   On: 10/19/2014 13:11    Microbiology: No results found for this or any previous visit (from the past 240 hour(s)).   Labs: Basic Metabolic Panel:  Recent Labs Lab 10/19/14 1311  NA 135  K 4.0  CL 99*  CO2 27  GLUCOSE 90  BUN 5*  CREATININE 0.61  CALCIUM 9.1   Liver Function Tests: No results for input(s): AST, ALT, ALKPHOS, BILITOT, PROT, ALBUMIN in the last 168 hours. No results for input(s): LIPASE, AMYLASE in the last 168 hours. No results for input(s): AMMONIA in the last 168 hours. CBC:  Recent Labs Lab 10/19/14 1311  WBC 6.4  HGB 15.3*  HCT 44.8  MCV 87.7  PLT 226   Cardiac Enzymes:  Recent Labs Lab 10/19/14 1311 10/19/14 1639 10/19/14 1920 10/19/14 2209  TROPONINI <0.03 <0.03 <0.03 <0.03   BNP: BNP (last 3 results) No results for input(s): BNP in the last 8760 hours.  ProBNP (last 3 results) No results for input(s): PROBNP in the last  8760 hours.  CBG: No results for input(s): GLUCAP in the last 168 hours.     SignedLouellen Molder  [facility] 10/20/2014, 11:12 AM

## 2014-10-20 NOTE — Discharge Instructions (Signed)
° °  You have a Stress Test scheduled at [facility]. Your doctor has ordered this test to get a better idea of how your heart works.  Instructions:  No food/drink after midnight the night before.   No caffeine/decaf products 24 hours before, including medicines such as Excedrin or Goody Powders. Call if there are any questions.   Wear comfortable clothes and shoes.   It is OK to take your morning meds with a sip of water EXCEPT for those types of medicines listed below or otherwise instructed.  Special Medication Instructions:  Beta blockers such as metoprolol (Lopressor/Toprol XL), atenolol (Tenormin), carvedilol (Coreg), nebivolol (Bystolic), propranolol (Inderal) should not be taken for 24 hours before the test.  Calcium channel blockers such as diltiazem (Cardizem) or verapmil (Calan) should not be taken for 24 hours before the test.  Remove nitroglycerin patches and do not take nitrate preparations such as Imdur/isosorbide the day of your test.  No Persantine/Theophylline or Aggrenox medicines should be used within 24 hours of the test.   What To Expect: The whole test will take several hours. When you arrive in the lab, the technician will inject a small amount of radioactive tracer into your arm through an IV while you are resting quietly. This helps [place] to form pictures of your heart. You will likely only feel a sting from the IV. After a waiting period, resting pictures will be obtained under a big camera. These are the "before" pictures.  Next, you will be prepped for the stress portion of the test. This may include either walking on a treadmill or receiving a medicine that helps to dilate blood vessels in your heart to simulate the effect of exercise on your heart. If you are walking on a treadmill, you will walk at different paces to try to get your heart rate to a goal number that is based on your age. If your doctor has chosen the pharmacologic test, then you will  receive a medicine through your IV that may cause temporary nausea, flushing, shortness of breath and sometimes chest discomfort or vomiting. This is typically short-lived and usually resolves quickly. Your blood pressure and heart rate will be monitored, and we will be watching your EKG on a computer screen for any changes. During this portion of the test, the radiologist will inject another small amount of radioactive tracer into your IV. After a waiting period, you will undergo a second set of pictures. These are the "after" pictures.  The doctor reading the test will compare the before-and-after images to look for evidence of heart blockages or heart weakness. In certain instances, this test is done over 2 days but usually only takes 1 day to complete.

## 2014-10-20 NOTE — Progress Notes (Signed)
Amanda York discharged home with husband per MD order.  Discharge instructions reviewed and discussed with the patient, all questions and concerns answered. Copy of instructions and scripts given to patient.    Medication List    TAKE these medications        albuterol 108 (90 BASE) MCG/ACT inhaler  Commonly known as:  PROVENTIL HFA;VENTOLIN HFA  Inhale 2 puffs into the lungs every 6 (six) hours as needed for wheezing or shortness of breath.     aspirin 81 MG EC tablet  Take 1 tablet (81 mg total) by mouth daily.     levothyroxine 25 MCG tablet  Commonly known as:  SYNTHROID, LEVOTHROID     lisinopril 10 MG tablet  Commonly known as:  PRINIVIL,ZESTRIL     nicotine 21 mg/24hr patch  Commonly known as:  NICODERM CQ  Place 1 patch (21 mg total) onto the skin daily.     tiotropium 18 MCG inhalation capsule  Commonly known as:  SPIRIVA  Place 18 mcg into inhaler and inhale daily.        Patients skin is clean, dry and intact, no evidence of skin break down. IV site discontinued and catheter remains intact. Site without signs and symptoms of complications. Dressing and pressure applied.  Patient escorted to car by Lovena Le, RN in a wheelchair,  no distress noted upon discharge.  Regino Bellow 10/20/2014 1:05 PM

## 2014-10-20 NOTE — Consult Note (Signed)
Cardiology Consultation Note  Patient ID: Amanda York, MRN: [id number], DOB/AGE: 1965/05/29 49 y.o. Admit date: 10/19/2014   Date of Consult: 10/20/2014 Primary Physician: [facility] Cardiologist: Dr. Domenic Polite  Chief Complaint: chest pain Reason for Consultation: chest pain  HPI: Amanda York is a 49 y/o female with history of HTN, hypothyroidism, tobacco abuse since age 58, and COPD who presented to [facility] with chest pain. She has no prior cardiac history. About 4 days ago she noted onset of left sided breast pain that remained constantly present. She described it like someone was constantly pressing into her chest. It was worse when she would lean over into the playpen and lift her grandson out. It did not radiate and was not associated with any nausea, vomiting, diaphoresis, LEE, palpitations or syncope. She has intermittent dyspnea that she attributes to her COPD but denies any recent change in this. She says she stays very active caring for her 2 young granddchildren and caring for goats on her property. She has not had recent exertional CP or dyspnea or functional limitations. Pain was not worse with exertion or inspiration. She presented to Urgent Care to be checked out where she was referred to [facility] for rule out. At worst level CP was 7/10. She received NTG some time yesterday and CP gradually eased off. VSS. Telemetry unrevealing. Troponins neg x 4 and labs unrevealing except Hgb 15.3. CXR with COPD but no active disease. She had coffee this AM.  Past Medical History  Diagnosis Date  . COPD (chronic obstructive pulmonary disease)   . Hypertension   . Hypothyroidism   . Tobacco abuse       Most Recent Cardiac Studies: None   Surgical History:  Past Surgical History  Procedure Laterality Date  . None       Home Meds: Prior to Admission medications   Medication Sig Start Date End Date Taking? Authorizing Provider  levothyroxine (SYNTHROID, LEVOTHROID) 25 MCG tablet   10/14/14  Yes Historical Provider, MD  lisinopril (PRINIVIL,ZESTRIL) 10 MG tablet  09/08/14  Yes Historical Provider, MD  tiotropium (SPIRIVA) 18 MCG inhalation capsule Place 18 mcg into inhaler and inhale daily.   Yes Historical Provider, MD    Inpatient Medications:  . enoxaparin (LOVENOX) injection  40 mg Subcutaneous Q24H  . levothyroxine  25 mcg Oral QAC breakfast  . lisinopril  10 mg Oral Daily  . tiotropium  18 mcg Inhalation Daily      Allergies: No Known Allergies  History   Social History  . Marital Status: Married    Spouse Name: N/A  . Number of Children: N/A  . Years of Education: N/A   Occupational History  . Not on file.   Social History Main Topics  . Smoking status: Current Every Day Smoker -- 1.00 packs/day    Types: Cigarettes  . Smokeless tobacco: Not on file     Comment: Since age 35  . Alcohol Use: No  . Drug Use: No  . Sexual Activity: Not on file   Other Topics Concern  . Not on file   Social History Narrative     Family History  Problem Relation Age of Onset  . CAD Father     Died at 62 of MI  . CAD Paternal Uncle     Died at 34 of MI  . CAD Paternal Grandfather     Details unclaer     Review of Systems: All other systems reviewed and are otherwise negative except as  noted above.  Labs:  Recent Labs  10/19/14 1311 10/19/14 1639 10/19/14 1920 10/19/14 2209  TROPONINI <0.03 <0.03 <0.03 <0.03   Lab Results  Component Value Date   WBC 6.4 10/19/2014   HGB 15.3* 10/19/2014   HCT 44.8 10/19/2014   MCV 87.7 10/19/2014   PLT 226 10/19/2014    Recent Labs Lab 10/19/14 1311  NA 135  K 4.0  CL 99*  CO2 27  BUN 5*  CREATININE 0.61  CALCIUM 9.1  GLUCOSE 90   No results found for: CHOL, HDL, LDLCALC, TRIG No results found for: DDIMER  Radiology/Studies:  Dg Chest 2 View  10/19/2014   CLINICAL DATA:  Left side chest pain for 4 days. Shortness of breath. Chronic smoker, cough. History of COPD, hypertension.  EXAM: CHEST  2  VIEW  COMPARISON:  08/07/2008  FINDINGS: There is hyperinflation of the lungs compatible with COPD. Heart and mediastinal contours are within normal limits. No focal opacities or effusions. No acute bony abnormality.  IMPRESSION: COPD.  No active disease.   Electronically Signed   By: Rolm Baptise M.D.   On: 10/19/2014 13:11    Wt Readings from Last 3 Encounters:  10/19/14 119 lb 4.8 oz (54.114 kg)  03/11/11 134 lb (60.782 kg)    EKG: Sinus tach 108bpm, nonspecific ST-T change in V2 (possibly due to lead placement) but otherwise nonacute  All subsequent HRs have been 60s-70s  Physical Exam: Blood pressure 100/62, pulse 65, temperature 98.7 F (37.1 C), temperature source Oral, resp. rate 16, height 5\' 1"  (1.549 m), weight 119 lb 4.8 oz (54.114 kg), SpO2 93 %. General: Well developed, well nourished WF, in no acute distress. Head: Normocephalic, atraumatic, sclera non-icteric, no xanthomas, nares are without discharge.  Neck: Negative for carotid bruits. JVD not elevated. Lungs: Diminished throughout without wheezes, rales, or rhonchi. Breathing is unlabored. Heart: RRR with S1 S2. No murmurs, rubs, or gallops appreciated. Abdomen: Soft, non-tender, non-distended with normoactive bowel sounds. No hepatomegaly. No rebound/guarding. No obvious abdominal masses. Msk:  Strength and tone appear normal for age. Extremities: No clubbing or cyanosis. No edema.  Distal pedal pulses are 2+ and equal bilaterally. Neuro: Alert and oriented X 3. No facial asymmetry. No focal deficit. Moves all extremities spontaneously. Psych:  Responds to questions appropriately with a normal affect.    Assessment and Plan:  1. Persistent left chest/breast discomfort x 4 days - symptoms somewhat atypical. Despite 4 days worth of constant CP, EKG is nonacute and troponins negative x4. There was some relief with NTG after pain had begun to subside already, but she had not had any recent exertional symptoms. Risk factors  include HTN (controlled), family history, and ongoing tobacco abuse. Lipids pending. Start daily aspirin. Will discuss plan for workup with MD.  2. COPD with ongoing tobacco abuse - counseled regarding cessation. Not actively wheezing.  3. HTN - controlled.   4. Unknown lipid status - pending.  SignedMelina Copa PA-C 10/20/2014, 8:13 AM Pager: [phone]   Attending note:  Patient seen and examined. Reviewed records and discussed the case with Amanda York. I agree with her above assessment. Amanda York presents with fairly atypical, prolonged left chest/breast discomfort without obvious precipitant. Somewhat worse when she leans over to lift up her grandson out of a playpen, otherwise no exacerbation with exertion. Described as a pressure-like sensation, not entirely clear that nitroglycerin was effective. Today she feels better after hospital observation, troponin I levels have been completely normal. ECG is  nonspecific. Chest x-ray shows changes consistent with COPD, otherwise no infiltrates or edema. On examination she is comfortable this morning, lungs exhibit diminished breath sounds without wheezing, cardiac exam with RRR and no gallop or rub. Cardiac risk factors include long-standing tobacco abuse, hypertension, and premature CAD in first-degree relatives. Patient is eager to go home today. We will arrange an outpatient exercise Cardiolite for further risk stratification and then schedule an office visit with Ms. Lawrence NP to review the results. If low risk, would recommend cardiac risk factor modification strategies including smoking cessation, and keep follow-up with primary care provider.  Satira Sark, M.D., F.A.C.C.

## 2014-10-21 LAB — HEMOGLOBIN A1C
Hgb A1c MFr Bld: 5.7 % — ABNORMAL HIGH (ref 4.8–5.6)
Mean Plasma Glucose: 117 mg/dL

## 2014-10-26 ENCOUNTER — Other Ambulatory Visit: Payer: Self-pay | Admitting: Physician Assistant

## 2014-10-26 DIAGNOSIS — R079 Chest pain, unspecified: Secondary | ICD-10-CM

## 2014-10-27 ENCOUNTER — Encounter (HOSPITAL_COMMUNITY)
Admission: RE | Admit: 2014-10-27 | Discharge: 2014-10-27 | Disposition: A | Discharge status: Home / Self Care | Payer: BLUE CROSS/BLUE SHIELD | Source: Ambulatory Visit | Attending: Physician Assistant | Admitting: Physician Assistant

## 2014-10-27 ENCOUNTER — Encounter (HOSPITAL_COMMUNITY): Payer: Self-pay

## 2014-10-27 ENCOUNTER — Inpatient Hospital Stay (HOSPITAL_COMMUNITY): Admit: 2014-10-27 | Payer: 59

## 2014-10-27 DIAGNOSIS — R079 Chest pain, unspecified: Secondary | ICD-10-CM

## 2014-10-27 LAB — NM MYOCAR MULTI W/SPECT W/WALL MOTION / EF
CHL CUP MPHR: 170 {beats}/min
CHL CUP NUCLEAR SDS: 23
CHL CUP NUCLEAR SSS: 25
CHL CUP RESTING HR STRESS: 65 {beats}/min
CHL RATE OF PERCEIVED EXERTION: 13
CSEPED: 8 min
CSEPEDS: 1 s
CSEPEW: 10.1 METS
LV dias vol: 71 mL
LVSYSVOL: 24 mL
NUC STRESS TID: 0.84
Peak HR: 151 {beats}/min
Percent HR: 88 %
RATE: 0
SRS: 2

## 2014-10-27 MED ORDER — TECHNETIUM TC 99M SESTAMIBI - CARDIOLITE
30.0000 | Freq: Once | INTRAVENOUS | Status: AC | PRN
  Administered 2014-10-27: 10:00:00 30 via INTRAVENOUS

## 2014-10-27 MED ORDER — REGADENOSON 0.4 MG/5ML IV SOLN
INTRAVENOUS | Status: AC
  Filled 2014-10-27: qty 5

## 2014-10-27 MED ORDER — TECHNETIUM TC 99M SESTAMIBI GENERIC - CARDIOLITE
10.0000 | Freq: Once | INTRAVENOUS | Status: AC | PRN
  Administered 2014-10-27: 10 via INTRAVENOUS

## 2014-10-27 MED ORDER — SODIUM CHLORIDE 0.9 % IJ SOLN
INTRAMUSCULAR | Status: AC
  Administered 2014-10-27: 10 mL via INTRAVENOUS
  Filled 2014-10-27: qty 3

## 2014-10-30 ENCOUNTER — Encounter: Payer: Self-pay | Admitting: Adult Health

## 2014-10-30 ENCOUNTER — Ambulatory Visit (INDEPENDENT_AMBULATORY_CARE_PROVIDER_SITE_OTHER): Payer: BLUE CROSS/BLUE SHIELD | Admitting: Adult Health

## 2014-10-30 VITALS — BP 120/78 | HR 98 | Ht 61.0 in | Wt 122.8 lb

## 2014-10-30 DIAGNOSIS — R079 Chest pain, unspecified: Secondary | ICD-10-CM

## 2014-10-30 NOTE — Patient Instructions (Signed)
Your physician recommends that you schedule a follow-up appointment As needed  Your physician recommends that you continue on your current medications as directed. Please refer to the Current Medication list given to you today.  Thank you for choosing [facility]!    

## 2014-10-30 NOTE — Progress Notes (Signed)
Cardiology Office Note   Date:  10/30/2014   ID:  Amanda OCONNOR, DOB [birth year], MRN [id number]  PCP:  No PCP Per Patient  Cardiologist:  Delman Cheadle, NP   Chief Complaint  Patient presents with  . Hypertension      History of Present Illness: Amanda York is a 49 y.o. female who presents for ongoing assessment and management of hypertension, ongoing tobacco abuse, with history of COPD, and hypothyroidism.  The patient was last seen during consultation for chest pain while hospitalized on 10/19/2014.  She was seen by Dr. Domenic Polite.  At that time.  The symptoms, for her chest pain, were found to be atypical , constant pressure. She was recommended for outpatient stress Myoview, started on aspirin, and counseled on smoking cessation.   Stress test was completed demonstrating:   There was no ST segment deviation noted during stress.  The left ventricular ejection fraction is hyperdynamic (>65%).  This is a low risk study.  Small area of inferior and basal inferoseptal ischemia vs. artifact, overall low risk perfusion findings  Duke treadmill score of 8, consistent with low risk for major cardiac events  Very good exercise functional capacity (120% of predicted based on gender and age).  She offers no further complaints.  She continues to be active and has had no recurrence of chest discomfort.  Past Medical History  Diagnosis Date  . COPD (chronic obstructive pulmonary disease)   . Hypertension   . Hypothyroidism   . Tobacco abuse     Past Surgical History  Procedure Laterality Date  . None       Current Outpatient Prescriptions  Medication Sig Dispense Refill  . albuterol (PROVENTIL HFA;VENTOLIN HFA) 108 (90 BASE) MCG/ACT inhaler Inhale 2 puffs into the lungs every 6 (six) hours as needed for wheezing or shortness of breath. 1 Inhaler 3  . aspirin EC 81 MG EC tablet Take 1 tablet (81 mg total) by mouth daily. 30 tablet 0  . levothyroxine (SYNTHROID,  LEVOTHROID) 25 MCG tablet   2  . lisinopril (PRINIVIL,ZESTRIL) 10 MG tablet   0  . tiotropium (SPIRIVA) 18 MCG inhalation capsule Place 18 mcg into inhaler and inhale daily.     No current facility-administered medications for this visit.    Allergies:   Review of patient's allergies indicates no known allergies.    Social History:  The patient  reports that she has been smoking Cigarettes.  She has been smoking about 1.00 pack per day. She does not have any smokeless tobacco history on file. She reports that she does not drink alcohol or use illicit drugs.   Family History:  The patient's family history includes CAD in her father, paternal grandfather, and paternal uncle.    ROS: .   All other systems are reviewed and negative.Unless otherwise mentioned in H&P above.   PHYSICAL EXAM: VS:  BP 120/78 mmHg  Pulse 98  Ht 5\' 1"  (1.549 m)  Wt 122 lb 12.8 oz (55.702 kg)  BMI 23.21 kg/m2  SpO2 95% , BMI Body mass index is 23.21 kg/(m^2). GEN: Well nourished, well developed, in no acute distress Cardiac: RRR; no murmurs, rubs, or gallops,no edema  Respiratory:  clear to auscultation bilaterally, normal work of breathing Psych: euthymic mood, full affect  Recent Labs: 10/19/2014: BUN 5*; Creatinine, Ser 0.61; Hemoglobin 15.3*; Platelets 226; Potassium 4.0; Sodium 135    Lipid Panel    Component Value Date/Time   CHOL 197 10/20/2014 0847   TRIG  99 10/20/2014 0847   HDL 51 10/20/2014 0847   CHOLHDL 3.9 10/20/2014 0847   VLDL 20 10/20/2014 0847   LDLCALC 126* 10/20/2014 0847      Wt Readings from Last 3 Encounters:  10/30/14 122 lb 12.8 oz (55.702 kg)  10/19/14 119 lb 4.8 oz (54.114 kg)  03/11/11 134 lb (60.782 kg)      Other studies Reviewed: Additional studies/ records that were reviewed today include: None Review of the above records demonstrates: N/A   ASSESSMENT AND PLAN:  1. Chest Pain: I reviewed her stress test.  Discussed her results.  She is reassured from our  standpoint, she appears to be stable.  I think some of her dyspnea and chest pressure related to COPD.  Will not make any changes in medication.  We will see her when necessary.   Current medicines are reviewed at length with the patient today.    Labs/ tests ordered today include:None No orders of the defined types were placed in this encounter.     Disposition:   FU with PRN Signed, Jory Sims, NP  10/30/2014 3:15 PM    [facility] [address], [place], [place] [address] Phone: [phone]; Fax: [phone]

## 2014-10-30 NOTE — Progress Notes (Deleted)
Name: Amanda York    DOB: 05-28-1965  Age: 49 y.o.  MR#: [id number]       PCP:  No PCP Per Patient      Insurance: Payor: [place] / Plan: BCBS OTHER / Product Type: *No Product type* /   CC:    Chief Complaint  Patient presents with  . Hypertension    VS Filed Vitals:   10/30/14 1325  BP: 120/78  Pulse: 98  Height: 5' 1"  (1.549 m)  Weight: 122 lb 12.8 oz (55.702 kg)  SpO2: 95%    Weights Current Weight  10/30/14 122 lb 12.8 oz (55.702 kg)  10/19/14 119 lb 4.8 oz (54.114 kg)  03/11/11 134 lb (60.782 kg)    Blood Pressure  BP Readings from Last 3 Encounters:  10/30/14 120/78  10/20/14 111/76  03/11/11 119/69     Admit date:  (Not on file) Last encounter with RMR:  Visit date not found   Allergy Review of patient's allergies indicates no known allergies.  Current Outpatient Prescriptions  Medication Sig Dispense Refill  . albuterol (PROVENTIL HFA;VENTOLIN HFA) 108 (90 BASE) MCG/ACT inhaler Inhale 2 puffs into the lungs every 6 (six) hours as needed for wheezing or shortness of breath. 1 Inhaler 3  . aspirin EC 81 MG EC tablet Take 1 tablet (81 mg total) by mouth daily. 30 tablet 0  . levothyroxine (SYNTHROID, LEVOTHROID) 25 MCG tablet   2  . lisinopril (PRINIVIL,ZESTRIL) 10 MG tablet   0  . tiotropium (SPIRIVA) 18 MCG inhalation capsule Place 18 mcg into inhaler and inhale daily.     No current facility-administered medications for this visit.    Discontinued Meds:    Medications Discontinued During This Encounter  Medication Reason  . nicotine (NICODERM CQ) 21 mg/24hr patch Error    Patient Active Problem List   Diagnosis Date Noted  . COPD (chronic obstructive pulmonary disease) 10/20/2014  . Atypical chest pain 10/20/2014  . Tobacco abuse 10/20/2014  . Chest pain 10/19/2014  . Hypertension 10/19/2014    LABS    Component Value Date/Time   NA 135 10/19/2014 1311   NA 137 03/11/2011 2026   NA 139 05/28/2009 0413   K 4.0 10/19/2014  1311   K 3.8 03/11/2011 2026   K 4.0 05/28/2009 0413   CL 99* 10/19/2014 1311   CL 98 03/11/2011 2026   CL 105 05/28/2009 0413   CO2 27 10/19/2014 1311   CO2 31 03/11/2011 2026   CO2 27 05/28/2009 0413   GLUCOSE 90 10/19/2014 1311   GLUCOSE 98 03/11/2011 2026   GLUCOSE 96 05/28/2009 0413   BUN 5* 10/19/2014 1311   BUN 6 03/11/2011 2026   BUN 7 05/28/2009 0413   CREATININE 0.61 10/19/2014 1311   CREATININE 0.65 03/11/2011 2026   CREATININE 0.74 05/28/2009 0413   CALCIUM 9.1 10/19/2014 1311   CALCIUM 10.0 03/11/2011 2026   CALCIUM 9.2 05/28/2009 0413   GFRNONAA >60 10/19/2014 1311   GFRNONAA >90 03/11/2011 2026   GFRNONAA >60 05/28/2009 0413   GFRAA >60 10/19/2014 1311   GFRAA >90 03/11/2011 2026   GFRAA  05/28/2009 0413    >60        The eGFR has been calculated using the MDRD equation. This calculation has not been validated in all clinical situations. eGFR's persistently <60 mL/min signify possible Chronic Kidney Disease.   CMP     Component Value Date/Time   NA 135 10/19/2014 1311   K  4.0 10/19/2014 1311   CL 99* 10/19/2014 1311   CO2 27 10/19/2014 1311   GLUCOSE 90 10/19/2014 1311   BUN 5* 10/19/2014 1311   CREATININE 0.61 10/19/2014 1311   CALCIUM 9.1 10/19/2014 1311   PROT 6.8 05/28/2009 0413   ALBUMIN 4.0 05/28/2009 0413   AST 16 05/28/2009 0413   ALT 15 05/28/2009 0413   ALKPHOS 52 05/28/2009 0413   BILITOT 0.4 05/28/2009 0413   GFRNONAA >60 10/19/2014 1311   GFRAA >60 10/19/2014 1311       Component Value Date/Time   WBC 6.4 10/19/2014 1311   WBC 8.5 03/11/2011 2026   WBC 8.6 05/28/2009 0413   HGB 15.3* 10/19/2014 1311   HGB 15.2* 03/11/2011 2026   HGB 15.3* 05/28/2009 0413   HCT 44.8 10/19/2014 1311   HCT 44.9 03/11/2011 2026   HCT 44.9 05/28/2009 0413   MCV 87.7 10/19/2014 1311   MCV 89.4 03/11/2011 2026   MCV 89.1 05/28/2009 0413    Lipid Panel     Component Value Date/Time   CHOL 197 10/20/2014 0847   TRIG 99 10/20/2014 0847    HDL 51 10/20/2014 0847   CHOLHDL 3.9 10/20/2014 0847   VLDL 20 10/20/2014 0847   LDLCALC 126* 10/20/2014 0847    ABG No results found for: PHART, PCO2ART, PO2ART, HCO3, TCO2, ACIDBASEDEF, O2SAT   No results found for: TSH BNP (last 3 results) No results for input(s): BNP in the last 8760 hours.  ProBNP (last 3 results) No results for input(s): PROBNP in the last 8760 hours.  Cardiac Panel (last 3 results) No results for input(s): CKTOTAL, CKMB, TROPONINI, RELINDX in the last 72 hours.  Iron/TIBC/Ferritin/ %Sat No results found for: IRON, TIBC, FERRITIN, IRONPCTSAT   EKG Orders placed or performed during the hospital encounter of 10/19/14  . ED EKG  . ED EKG  . ED EKG within 10 minutes  . ED EKG within 10 minutes  . EKG     Prior Assessment and Plan Problem List as of 10/30/2014      Cardiovascular and Mediastinum   Hypertension     Respiratory   COPD (chronic obstructive pulmonary disease)     Other   Chest pain   Atypical chest pain   Tobacco abuse       Imaging: Dg Chest 2 View  10/19/2014   CLINICAL DATA:  Left side chest pain for 4 days. Shortness of breath. Chronic smoker, cough. History of COPD, hypertension.  EXAM: CHEST  2 VIEW  COMPARISON:  08/07/2008  FINDINGS: There is hyperinflation of the lungs compatible with COPD. Heart and mediastinal contours are within normal limits. No focal opacities or effusions. No acute bony abnormality.  IMPRESSION: COPD.  No active disease.   Electronically Signed   By: Rolm Baptise M.D.   On: 10/19/2014 13:11   Nm Myocar Multi W/spect W/wall Motion / Ef  10/27/2014    There was no ST segment deviation noted during stress.  The left ventricular ejection fraction is hyperdynamic (>65%).  This is a low risk study.  Small area of inferior and basal inferoseptal ischemia vs. artifact,  overall low risk perfusion findings  Duke treadmill score of 8, consistent with low risk for major cardiac  events  Very good exercise  functional capacity (120% of predicted based on  gender and age)

## 2015-01-26 ENCOUNTER — Encounter (INDEPENDENT_AMBULATORY_CARE_PROVIDER_SITE_OTHER): Payer: Self-pay

## 2015-01-26 ENCOUNTER — Ambulatory Visit (INDEPENDENT_AMBULATORY_CARE_PROVIDER_SITE_OTHER): Payer: BLUE CROSS/BLUE SHIELD | Admitting: Pediatrics

## 2015-01-26 ENCOUNTER — Encounter: Payer: Self-pay | Admitting: Pediatrics

## 2015-01-26 VITALS — BP 121/82 | HR 72 | Temp 97.6°F | Ht 61.0 in | Wt 119.2 lb

## 2015-01-26 DIAGNOSIS — R634 Abnormal weight loss: Secondary | ICD-10-CM

## 2015-01-26 DIAGNOSIS — Z0000 Encounter for general adult medical examination without abnormal findings: Secondary | ICD-10-CM

## 2015-01-26 DIAGNOSIS — J449 Chronic obstructive pulmonary disease, unspecified: Secondary | ICD-10-CM

## 2015-01-26 DIAGNOSIS — Z720 Tobacco use: Secondary | ICD-10-CM

## 2015-01-26 DIAGNOSIS — E039 Hypothyroidism, unspecified: Secondary | ICD-10-CM

## 2015-01-26 DIAGNOSIS — I10 Essential (primary) hypertension: Secondary | ICD-10-CM

## 2015-01-26 DIAGNOSIS — B070 Plantar wart: Secondary | ICD-10-CM

## 2015-01-26 MED ORDER — VARENICLINE TARTRATE 0.5 MG X 11 & 1 MG X 42 PO MISC
ORAL | Status: DC
Start: 2015-01-26 — End: 2015-03-03

## 2015-01-26 NOTE — Progress Notes (Signed)
Subjective:    Patient ID: Amanda York, female    DOB: [birth year], 49 y.o.   MRN: [id number]  CC: CPE  HPI: Amanda York is a 49 y.o. female presenting on 01/26/2015 for Annual Exam and multiple med problem f/u.   Overall has been feeling well.  Breast ca in [facility] and two aunts on mothers side HTN: takes lisinopril, is well controlled usually on that. No headaches or vision changes. Appetite has been fine, weight down a few lbs, does fluctuate up and down some. No chest pain with exertion, no SOB. Wants to quit smoking, now doing 1/2 ppd. Interested in assistance with cessation.  H/o COPD, not needing albuterol recently  ROS: All systems negative other than what is in the HPI  Past Medical History Patient Active Problem List   Diagnosis Date Noted  . COPD (chronic obstructive pulmonary disease) ([place]) 10/20/2014  . Atypical chest pain 10/20/2014  . Tobacco abuse 10/20/2014  . Chest pain 10/19/2014  . Hypertension 10/19/2014   Family History  Problem Relation Age of Onset  . CAD Father     Died at 23 of MI  . CAD Paternal Uncle     Died at 53 of MI  . CAD Paternal Grandfather     Details unclaer   Social History   Social History  . Marital Status: Married    Spouse Name: N/A  . Number of Children: N/A  . Years of Education: N/A   Occupational History  . Not on file.   Social History Main Topics  . Smoking status: Current Every Day Smoker -- 1.00 packs/day    Types: Cigarettes  . Smokeless tobacco: Not on file     Comment: Since age 45  . Alcohol Use: No  . Drug Use: No  . Sexual Activity: Not on file   Other Topics Concern  . Not on file   Social History Narrative     Current Outpatient Prescriptions  Medication Sig Dispense Refill  . albuterol (PROVENTIL HFA;VENTOLIN HFA) 108 (90 BASE) MCG/ACT inhaler Inhale 2 puffs into the lungs every 6 (six) hours as needed for wheezing or shortness of breath. 1 Inhaler 3  . aspirin EC 81 MG EC tablet  Take 1 tablet (81 mg total) by mouth daily. 30 tablet 0  . levothyroxine (SYNTHROID, LEVOTHROID) 25 MCG tablet   2  . lisinopril (PRINIVIL,ZESTRIL) 10 MG tablet   0  . tiotropium (SPIRIVA) 18 MCG inhalation capsule Place 18 mcg into inhaler and inhale daily.    . varenicline (CHANTIX STARTING MONTH PAK) 0.5 MG X 11 & 1 MG X 42 tablet Take one 0.5 mg tablet by mouth once daily for 3 days, then increase to one 0.5 mg tablet twice daily for 4 days, then increase to one 1 mg tablet twice daily. 53 tablet 0   No current facility-administered medications for this visit.       Objective:    BP 121/82 mmHg  Pulse 72  Temp(Src) 97.6 F (36.4 C) (Oral)  Ht 5' 1"  (1.549 m)  Wt 119 lb 3.2 oz (54.069 kg)  BMI 22.53 kg/m2  Wt Readings from Last 3 Encounters:  01/26/15 119 lb 3.2 oz (54.069 kg)  10/30/14 122 lb 12.8 oz (55.702 kg)  10/19/14 119 lb 4.8 oz (54.114 kg)    Gen: NAD, alert, cooperative with exam, NCAT EYES: EOMI, no scleral injection or icterus ENT:  TMs pearly gray b/l, OP without erythema LYMPH: no cervical LAD  CV: NRRR, normal S1/S2, no murmur, WWP Resp: CTABL, no wheezes, normal WOB Abd: +BS, soft, NTND. no guarding or organomegaly Ext: No edema Neuro: Alert and oriented, strength equal b/l UE and LE, coordination grossly normal MSK: normal muscle bulk     Assessment & Plan:   Avya was seen today for annual exam medical problem follow up.  Diagnoses and all orders for this visit:  Encounter for preventive health examination -     Lipid panel  Loss of weight Small amount, weighs the same now as she did 3 months ago. -     CMP14+EGFR -     CBC  Hypothyroidism, unspecified hypothyroidism type Continue levothyroxine, will recheck TSH. -     Thyroid Panel With TSH  Essential hypertension -     CMP14+EGFR  Tobacco abuse 10 minutes were spent directly counseling about tobacco abuse and cessation strategies. Will start pt on chantix, discussed side effects to watch  out for. Pt will pick a stop date then start the starter pack 2 weeks before. Will call for refills.  -     varenicline (CHANTIX STARTING MONTH PAK) 0.5 MG X 11 & 1 MG X 42 tablet; Take one 0.5 mg tablet by mouth once daily for 3 days, then increase to one 0.5 mg tablet twice daily for 4 days, then increase to one 1 mg tablet twice daily.  Plantar wart of both feet Use salicyclic acid daily, file down once a week.  COPD continue spiriva, albuterol as needed. Has not had any recent breathing trouble or required albuterol. Smoking cessatoin as above will help.   Follow up plan: Return in about 12 weeks (around 04/20/2015).  [name], MD [facility] 01/26/2015, 11:20 AM

## 2015-01-27 ENCOUNTER — Telehealth: Payer: Self-pay | Admitting: Pediatrics

## 2015-01-27 LAB — CBC
Hematocrit: 44.9 % (ref 34.0–46.6)
Hemoglobin: 15.4 g/dL (ref 11.1–15.9)
MCH: 30.5 pg (ref 26.6–33.0)
MCHC: 34.3 g/dL (ref 31.5–35.7)
MCV: 89 fL (ref 79–97)
PLATELETS: 240 10*3/uL (ref 150–379)
RBC: 5.05 x10E6/uL (ref 3.77–5.28)
RDW: 14.4 % (ref 12.3–15.4)
WBC: 7 10*3/uL (ref 3.4–10.8)

## 2015-01-27 LAB — CMP14+EGFR
ALT: 10 IU/L (ref 0–32)
AST: 16 IU/L (ref 0–40)
Albumin/Globulin Ratio: 2.2 (ref 1.1–2.5)
Albumin: 4.6 g/dL (ref 3.5–5.5)
Alkaline Phosphatase: 73 IU/L (ref 39–117)
BUN/Creatinine Ratio: 10 (ref 9–23)
BUN: 5 mg/dL — ABNORMAL LOW (ref 6–24)
Bilirubin Total: 0.4 mg/dL (ref 0.0–1.2)
CALCIUM: 9.7 mg/dL (ref 8.7–10.2)
CO2: 23 mmol/L (ref 18–29)
Chloride: 94 mmol/L — ABNORMAL LOW (ref 97–106)
Creatinine, Ser: 0.52 mg/dL — ABNORMAL LOW (ref 0.57–1.00)
GFR, EST AFRICAN AMERICAN: 129 mL/min/{1.73_m2} (ref >59)
GFR, EST NON AFRICAN AMERICAN: 112 mL/min/{1.73_m2} (ref >59)
GLOBULIN, TOTAL: 2.1 g/dL (ref 1.5–4.5)
Glucose: 73 mg/dL (ref 65–99)
POTASSIUM: 4.1 mmol/L (ref 3.5–5.2)
SODIUM: 137 mmol/L (ref 136–144)
TOTAL PROTEIN: 6.7 g/dL (ref 6.0–8.5)

## 2015-01-27 LAB — LIPID PANEL
CHOLESTEROL TOTAL: 218 mg/dL — AB (ref 100–199)
Chol/HDL Ratio: 3.6 ratio units (ref 0.0–4.4)
HDL: 61 mg/dL (ref >39)
LDL Calculated: 138 mg/dL — ABNORMAL HIGH (ref 0–99)
Triglycerides: 93 mg/dL (ref 0–149)
VLDL Cholesterol Cal: 19 mg/dL (ref 5–40)

## 2015-01-27 LAB — THYROID PANEL WITH TSH
Free Thyroxine Index: 2.9 (ref 1.2–4.9)
T3 UPTAKE RATIO: 24 % (ref 24–39)
T4 TOTAL: 12 ug/dL (ref 4.5–12.0)
TSH: 1.79 u[IU]/mL (ref 0.450–4.500)

## 2015-01-27 NOTE — Telephone Encounter (Signed)
Pt aware results have not been reviewed and we will call her once provider reviews.

## 2015-01-29 ENCOUNTER — Other Ambulatory Visit: Payer: BLUE CROSS/BLUE SHIELD

## 2015-01-29 DIAGNOSIS — Z1212 Encounter for screening for malignant neoplasm of rectum: Secondary | ICD-10-CM

## 2015-01-29 NOTE — Progress Notes (Signed)
Lab only 

## 2015-02-01 LAB — FECAL OCCULT BLOOD, IMMUNOCHEMICAL: Fecal Occult Bld: NEGATIVE

## 2015-02-02 NOTE — Progress Notes (Signed)
Patient informed. 

## 2015-03-03 ENCOUNTER — Ambulatory Visit (INDEPENDENT_AMBULATORY_CARE_PROVIDER_SITE_OTHER): Payer: BLUE CROSS/BLUE SHIELD | Admitting: Pediatrics

## 2015-03-03 ENCOUNTER — Encounter: Payer: Self-pay | Admitting: Pediatrics

## 2015-03-03 VITALS — BP 130/95 | HR 87 | Temp 97.8°F | Ht 61.0 in | Wt 118.6 lb

## 2015-03-03 DIAGNOSIS — F329 Major depressive disorder, single episode, unspecified: Secondary | ICD-10-CM | POA: Diagnosis not present

## 2015-03-03 DIAGNOSIS — Z720 Tobacco use: Secondary | ICD-10-CM

## 2015-03-03 DIAGNOSIS — J309 Allergic rhinitis, unspecified: Secondary | ICD-10-CM | POA: Diagnosis not present

## 2015-03-03 DIAGNOSIS — I10 Essential (primary) hypertension: Secondary | ICD-10-CM

## 2015-03-03 DIAGNOSIS — F32A Depression, unspecified: Secondary | ICD-10-CM

## 2015-03-03 MED ORDER — CETIRIZINE HCL 10 MG PO TABS: 10.0000 mg | ORAL_TABLET | Freq: Every day | ORAL | Status: DC

## 2015-03-03 MED ORDER — ESCITALOPRAM OXALATE 10 MG PO TABS: 10.0000 mg | ORAL_TABLET | Freq: Every day | ORAL | Status: DC

## 2015-03-03 NOTE — Patient Instructions (Signed)
Half a tab for the next 8 days (5mg ) After 4 weeks Jan 1 can increase to 2 tablets if still havign symptoms Call me so I can send in new prescription for 20mg 

## 2015-03-03 NOTE — Progress Notes (Signed)
Subjective:    Patient ID: Amanda York, female    DOB: 25-Feb-1966, 49 y.o.   MRN: [id number]  CC: depression  HPI: Amanda York is a 49 y.o. female presenting for depression  5-6 months has been having hard time with her mood. Daughers really wanted her to come in to discuss mood Low energy  She has custody of 1yo and 2yo grandchildren Doesn't want to be put on wellbutrin, altered her daughter's taste Poor appetite, makes herself eat daily Doesn't watch tv Has bedtime routine, still has hard time sleeping, not watching tv COPD flared a couple weeks ago, better now Still smoking daily, has not started the chantix because she was worried about it worsening her mood    Depression screen [facility] 2/9 03/03/2015 01/26/2015  Decreased Interest 3 0  Down, Depressed, Hopeless 3 0  PHQ - 2 Score 6 0  Altered sleeping 3 -  Tired, decreased energy 3 -  Change in appetite 3 -  Feeling bad or failure about yourself  3 -  Trouble concentrating 3 -  Moving slowly or fidgety/restless 0 -  Suicidal thoughts 0 -  PHQ-9 Score 21 -  Difficult doing work/chores Very difficult -     Relevant past medical, surgical, family and social history reviewed and updated as indicated. Interim medical history since our last visit reviewed. Allergies and medications reviewed and updated.    ROS: Per HPI unless specifically indicated above  Past Medical History Patient Active Problem List   Diagnosis Date Noted  . COPD (chronic obstructive pulmonary disease) ([place]) 10/20/2014  . Atypical chest pain 10/20/2014  . Tobacco abuse 10/20/2014  . Chest pain 10/19/2014  . Hypertension 10/19/2014    Current Outpatient Prescriptions  Medication Sig Dispense Refill  . aspirin EC 81 MG EC tablet Take 1 tablet (81 mg total) by mouth daily. 30 tablet 0  . levothyroxine (SYNTHROID, LEVOTHROID) 25 MCG tablet   2  . lisinopril (PRINIVIL,ZESTRIL) 10 MG tablet   0  . tiotropium (SPIRIVA) 18 MCG inhalation  capsule Place 18 mcg into inhaler and inhale daily.    Marland Kitchen albuterol (PROVENTIL HFA;VENTOLIN HFA) 108 (90 BASE) MCG/ACT inhaler Inhale 2 puffs into the lungs every 6 (six) hours as needed for wheezing or shortness of breath. (Patient not taking: Reported on 03/03/2015) 1 Inhaler 3  . cetirizine (ZYRTEC) 10 MG tablet Take 1 tablet (10 mg total) by mouth daily. 30 tablet 11  . escitalopram (LEXAPRO) 10 MG tablet Take 1 tablet (10 mg total) by mouth daily. 30 tablet 3  . varenicline (CHANTIX STARTING MONTH PAK) 0.5 MG X 11 & 1 MG X 42 tablet Take one 0.5 mg tablet by mouth once daily for 3 days, then increase to one 0.5 mg tablet twice daily for 4 days, then increase to one 1 mg tablet twice daily. (Patient not taking: Reported on 03/03/2015) 53 tablet 0   No current facility-administered medications for this visit.       Objective:    BP 130/95 mmHg  Pulse 87  Temp(Src) 97.8 F (36.6 C) (Oral)  Ht 5\' 1"  (1.549 m)  Wt 118 lb 9.6 oz (53.797 kg)  BMI 22.42 kg/m2  Wt Readings from Last 3 Encounters:  03/03/15 118 lb 9.6 oz (53.797 kg)  01/26/15 119 lb 3.2 oz (54.069 kg)  10/30/14 122 lb 12.8 oz (55.702 kg)    Gen: NAD, alert, cooperative with exam, NCAT EYES: EOMI, no scleral injection or icterus ENT:  TMs  pearly gray b/l, OP without erythema LYMPH: no cervical LAD CV: NRRR, normal S1/S2, no murmur, distal pulses 2+ b/l Resp: CTABL, no wheezes, normal WOB Abd: +BS, soft, NTND. no guarding or organomegaly Ext: No edema, warm Neuro: Alert and oriented, strength equal b/l UE and LE, coordination grossly normal MSK: normal muscle bulk Psych: slightly flattened affect, normal thought stream, mood is down, denies SI/HI     Assessment & Plan:    Diagnoses and all orders for this visit:  Depression Feels safe at home, is able to care for her two grandchildren but does feel liek she needs more treatment than she has now. Will start lexapro. RTC 8 weeks. No SI/HI. -     escitalopram  (LEXAPRO) 10 MG tablet; Take 1 tablet (10 mg total) by mouth daily.  Allergic rhinitis, unspecified allergic rhinitis type Congestion ongoing, worse after mowing grass. -     cetirizine (ZYRTEC) 10 MG tablet; Take 1 tablet (10 mg total) by mouth daily.  Tobacco abuse Don't start chantix with depression uncontrolled. Continue to work on decreasing number of cigarettes in a day.  Essential hypertension Slightly elevated today. Recheck next visit Continue lisinopril     Follow up plan: Return in about 8 weeks (around 04/28/2015).  [name], MD [facility] 03/03/2015, 8:41 AM

## 2015-04-28 ENCOUNTER — Encounter: Payer: Self-pay | Admitting: Pediatrics

## 2015-04-28 ENCOUNTER — Ambulatory Visit (INDEPENDENT_AMBULATORY_CARE_PROVIDER_SITE_OTHER): Payer: BLUE CROSS/BLUE SHIELD | Admitting: Pediatrics

## 2015-04-28 VITALS — BP 129/82 | HR 71 | Temp 97.3°F | Ht 61.0 in | Wt 121.8 lb

## 2015-04-28 DIAGNOSIS — J449 Chronic obstructive pulmonary disease, unspecified: Secondary | ICD-10-CM

## 2015-04-28 DIAGNOSIS — D485 Neoplasm of uncertain behavior of skin: Secondary | ICD-10-CM | POA: Diagnosis not present

## 2015-04-28 DIAGNOSIS — Z720 Tobacco use: Secondary | ICD-10-CM | POA: Diagnosis not present

## 2015-04-28 DIAGNOSIS — F32A Depression, unspecified: Secondary | ICD-10-CM | POA: Insufficient documentation

## 2015-04-28 DIAGNOSIS — I10 Essential (primary) hypertension: Secondary | ICD-10-CM | POA: Diagnosis not present

## 2015-04-28 DIAGNOSIS — F329 Major depressive disorder, single episode, unspecified: Secondary | ICD-10-CM

## 2015-04-28 MED ORDER — ESCITALOPRAM OXALATE 20 MG PO TABS: 20.0000 mg | ORAL_TABLET | Freq: Every day | ORAL | Status: DC

## 2015-04-28 NOTE — Progress Notes (Signed)
Subjective:    Patient ID: Amanda York, female    DOB: 09/10/1965, 50 y.o.   MRN: [id number]  CC: Follow-up depression  HPI: Amanda York is a 50 y.o. female presenting for Follow-up  Depression:  symptoms much better since starting the lexapro. Wishes she had started it sooner. Still has impatience at times Energy is still down but much better Appetite much better Tastes food fine Smoking less than 1/2 ppd Sleeping better  Breathing has been at baseline, no recent exacerbations Continuing to work on smoking cessation  Has had a fleshy growth corner of R eye for years, says it isnt changing. Interested in having it removed. None anywhere else on body.  FOBT negative in 01/2015  Depression screen [facility] 2/9 04/28/2015 03/03/2015 01/26/2015  Decreased Interest 1 3 0  Down, Depressed, Hopeless 1 3 0  PHQ - 2 Score 2 6 0  Altered sleeping 0 3 -  Tired, decreased energy 1 3 -  Change in appetite 0 3 -  Feeling bad or failure about yourself  0 3 -  Trouble concentrating 0 3 -  Moving slowly or fidgety/restless 0 0 -  Suicidal thoughts 0 0 -  PHQ-9 Score 3 21 -  Difficult doing work/chores Somewhat difficult Very difficult -     Relevant past medical, surgical, family and social history reviewed and updated as indicated. Interim medical history since our last visit reviewed. Allergies and medications reviewed and updated.    ROS: Per HPI unless specifically indicated above  History  Smoking status  . Current Every Day Smoker -- 1.00 packs/day  . Types: Cigarettes  Smokeless tobacco  . Not on file    Comment: Since age 41    Past Medical History Patient Active Problem List   Diagnosis Date Noted  . Depression 04/28/2015  . COPD (chronic obstructive pulmonary disease) ([place]) 10/20/2014  . Atypical chest pain 10/20/2014  . Tobacco abuse 10/20/2014  . Chest pain 10/19/2014  . Hypertension 10/19/2014    Current Outpatient Prescriptions  Medication Sig  Dispense Refill  . albuterol (PROVENTIL HFA;VENTOLIN HFA) 108 (90 BASE) MCG/ACT inhaler Inhale 2 puffs into the lungs every 6 (six) hours as needed for wheezing or shortness of breath. 1 Inhaler 3  . aspirin EC 81 MG EC tablet Take 1 tablet (81 mg total) by mouth daily. 30 tablet 0  . cetirizine (ZYRTEC) 10 MG tablet Take 1 tablet (10 mg total) by mouth daily. 30 tablet 11  . escitalopram (LEXAPRO) 20 MG tablet Take 1 tablet (20 mg total) by mouth daily. 90 tablet 2  . levothyroxine (SYNTHROID, LEVOTHROID) 25 MCG tablet   2  . lisinopril (PRINIVIL,ZESTRIL) 10 MG tablet   0  . tiotropium (SPIRIVA) 18 MCG inhalation capsule Place 18 mcg into inhaler and inhale daily.     No current facility-administered medications for this visit.       Objective:    BP 129/82 mmHg  Pulse 71  Temp(Src) 97.3 F (36.3 C) (Oral)  Ht 5\' 1"  (1.549 m)  Wt 121 lb 12.8 oz (55.248 kg)  BMI 23.03 kg/m2  Wt Readings from Last 3 Encounters:  04/28/15 121 lb 12.8 oz (55.248 kg)  03/03/15 118 lb 9.6 oz (53.797 kg)  01/26/15 119 lb 3.2 oz (54.069 kg)     Gen: NAD, alert, cooperative with exam, NCAT EYES: EOMI, no scleral injection or icterus ENT: OP without erythema LYMPH: no cervical LAD CV: NRRR, normal S1/S2, no murmur, distal pulses  2+ b/l Resp: CTABL, no wheezes, normal WOB Abd: +BS, soft, NTND. no guarding or organomegaly Ext: No edema, warm Neuro: Alert and oriented, strength equal b/l UE and LE, coordination grossly normal MSK: normal muscle bulk Psych: normal affect, no SI/HI     Assessment & Plan:    Artisha was seen today for follow-up of multiple med problems.  Diagnoses and all orders for this visit:  Depression Symptoms much improved on lexapro, but still present. Increase lexapro as below.  -     escitalopram (LEXAPRO) 20 MG tablet; Take 1 tablet (20 mg total) by mouth daily.  Tobacco abuse Continue to work on cessation, decreasing number of cigarettes. Discussed  strategies.  Essential hypertension Well controlled today, continue current medications.  Chronic obstructive pulmonary disease, unspecified COPD type ([place]) Breathing symptoms well controlled, continue current medications.  Neoplasm of uncertain behavior of skin Present for years, soft, flesh colored. Not changing or itching, on nose bridge, close to medial epicanthus of eye but not touching it.  -     Ambulatory referral to Dermatology   Follow up plan: Return in about 6 months (around 10/26/2015).  [name], MD [facility] 04/28/2015, 9:49 AM

## 2015-08-02 DIAGNOSIS — D485 Neoplasm of uncertain behavior of skin: Secondary | ICD-10-CM | POA: Diagnosis not present

## 2015-08-25 ENCOUNTER — Telehealth: Payer: Self-pay | Admitting: Pediatrics

## 2015-08-25 MED ORDER — LEVOTHYROXINE SODIUM 25 MCG PO TABS: 25.0000 ug | ORAL_TABLET | Freq: Every day | ORAL | Status: DC

## 2015-08-25 NOTE — Telephone Encounter (Signed)
done

## 2016-01-26 ENCOUNTER — Other Ambulatory Visit: Payer: Self-pay | Admitting: Pediatrics

## 2016-01-27 MED ORDER — LISINOPRIL 10 MG PO TABS: 10.0000 mg | ORAL_TABLET | Freq: Every day | ORAL | 0 refills | Status: DC

## 2016-01-27 MED ORDER — LEVOTHYROXINE SODIUM 25 MCG PO TABS: 25.0000 ug | ORAL_TABLET | Freq: Every day | ORAL | 4 refills | Status: DC

## 2016-01-27 MED ORDER — ESCITALOPRAM OXALATE 20 MG PO TABS: 20.0000 mg | ORAL_TABLET | Freq: Every day | ORAL | 0 refills | Status: DC

## 2016-01-27 NOTE — Telephone Encounter (Signed)
Done x 1 only

## 2016-01-31 ENCOUNTER — Ambulatory Visit (INDEPENDENT_AMBULATORY_CARE_PROVIDER_SITE_OTHER): Payer: BLUE CROSS/BLUE SHIELD | Admitting: Pediatrics

## 2016-01-31 ENCOUNTER — Encounter: Payer: Self-pay | Admitting: Pediatrics

## 2016-01-31 VITALS — BP 114/81 | HR 87 | Temp 97.5°F | Ht 61.0 in | Wt 127.2 lb

## 2016-01-31 DIAGNOSIS — E039 Hypothyroidism, unspecified: Secondary | ICD-10-CM

## 2016-01-31 DIAGNOSIS — I10 Essential (primary) hypertension: Secondary | ICD-10-CM | POA: Diagnosis not present

## 2016-01-31 DIAGNOSIS — F32A Depression, unspecified: Secondary | ICD-10-CM

## 2016-01-31 DIAGNOSIS — Z0000 Encounter for general adult medical examination without abnormal findings: Secondary | ICD-10-CM | POA: Diagnosis not present

## 2016-01-31 DIAGNOSIS — F329 Major depressive disorder, single episode, unspecified: Secondary | ICD-10-CM

## 2016-01-31 DIAGNOSIS — Z720 Tobacco use: Secondary | ICD-10-CM

## 2016-01-31 DIAGNOSIS — Z1211 Encounter for screening for malignant neoplasm of colon: Secondary | ICD-10-CM

## 2016-01-31 MED ORDER — ESCITALOPRAM OXALATE 20 MG PO TABS: 20.0000 mg | ORAL_TABLET | Freq: Every day | ORAL | 5 refills | Status: DC

## 2016-01-31 MED ORDER — LISINOPRIL 10 MG PO TABS: 10.0000 mg | ORAL_TABLET | Freq: Every day | ORAL | 5 refills | Status: DC

## 2016-01-31 MED ORDER — LEVOTHYROXINE SODIUM 25 MCG PO TABS: 25.0000 ug | ORAL_TABLET | Freq: Every day | ORAL | 5 refills | Status: DC

## 2016-01-31 NOTE — Progress Notes (Signed)
  Subjective:   Patient ID: Amanda York, female    DOB: Mar 15, 1966, 50 y.o.   MRN: [id number] CC: annual  HPI: Amanda York is a 50 y.o. female presenting for annual  Depression: Mood has been a lot better No thoughts of self harm Thinks lexapro helping Not in counseling  Cyst removed by eye surgery  HTN: Occasionally feels light headed No HA, no CP, no SOB  L side of face occasional twitching No facial droop  No nightmares but having more vivid dreams Sometimes tosses and turns some before sleeping  Breathing has been fine Spring time tends to be the worse season  Tobacco use: Smoking a lot less hasnt completely quit  Grandsons 2 and 72 yo she takes care of now Going well Potty trained one of them  HCM: Due for pap smear Mammogram scheduled with mobile bus  Relevant past medical, surgical, family and social history reviewed. Allergies and medications reviewed and updated. History  Smoking Status  . Current Every Day Smoker  . Packs/day: 1.00  . Types: Cigarettes  Smokeless Tobacco  . Never Used    Comment: Since age 63   ROS: Per HPI   Objective:    BP 114/81   Pulse 87   Temp 97.5 F (36.4 C) (Oral)   Ht 5' 1" (1.549 m)   Wt 127 lb 3.2 oz (57.7 kg)   BMI 24.03 kg/m   Wt Readings from Last 3 Encounters:  01/31/16 127 lb 3.2 oz (57.7 kg)  04/28/15 121 lb 12.8 oz (55.2 kg)  03/03/15 118 lb 9.6 oz (53.8 kg)    Gen: NAD, alert, cooperative with exam, NCAT EYES: EOMI, no conjunctival injection, or no icterus ENT:  L TM with clear effusion, normal LR, R TM pearly gray, OP without erythema LYMPH: no cervical LAD CV: NRRR, normal S1/S2, no murmur, distal pulses 2+ b/l Resp: CTABL, no wheezes, normal WOB Abd: +BS, soft, NTND. no guarding or organomegaly Ext: No edema, warm Neuro: Alert and oriented, strength equal b/l UE and LE, coordination grossly normal MSK: normal muscle bulk  Assessment & Plan:  [name] was seen today for medication  check, preventive exam  Diagnoses and all orders for this visit:  Encounter for preventive health examination Due for pap smear--pt wants to f/u with gynecology Due for mammo-scheduled with mobile unit Due for colonoscopy,no fam hx of colon ca. Annual stool test neg last year, wants to do again this year.  Essential hypertension Well controlled, occasional lightheadedness Check BPs at home, let me knwo if regularly low or high -     BMP8+EGFR -     lisinopril (PRINIVIL,ZESTRIL) 10 MG tablet; Take 1 tablet (10 mg total) by mouth daily.  Tobacco abuse Decreasing amount, now 1/2 ppd Cont to encourage cessation  Depression, unspecified depression type Stable, symptoms much improvedcont lexapro -     escitalopram (LEXAPRO) 20 MG tablet; Take 1 tablet (20 mg total) by mouth daily.  Colon cancer screening -     Fecal occult blood, imunochemical; Future  Hypothyroidism, unspecified type -     TSH -     levothyroxine (SYNTHROID, LEVOTHROID) 25 MCG tablet; Take 1 tablet (25 mcg total) by mouth daily before breakfast.  Declined flu shot  Follow up plan: 6 mo [name], MD [place]

## 2016-01-31 NOTE — Patient Instructions (Addendum)
Schedule appointment with gynecology for pap smear

## 2016-02-01 LAB — BMP8+EGFR
BUN / CREAT RATIO: 8 — AB (ref 9–23)
BUN: 5 mg/dL — ABNORMAL LOW (ref 6–24)
CHLORIDE: 95 mmol/L — AB (ref 96–106)
CO2: 25 mmol/L (ref 18–29)
Calcium: 9.8 mg/dL (ref 8.7–10.2)
Creatinine, Ser: 0.66 mg/dL (ref 0.57–1.00)
GFR calc Af Amer: 118 mL/min/{1.73_m2} (ref >59)
GFR calc non Af Amer: 103 mL/min/{1.73_m2} (ref >59)
GLUCOSE: 87 mg/dL (ref 65–99)
POTASSIUM: 4.6 mmol/L (ref 3.5–5.2)
SODIUM: 137 mmol/L (ref 134–144)

## 2016-02-01 LAB — TSH: TSH: 1.9 u[IU]/mL (ref 0.450–4.500)

## 2016-02-21 ENCOUNTER — Encounter: Payer: BLUE CROSS/BLUE SHIELD | Admitting: *Deleted

## 2016-03-31 ENCOUNTER — Other Ambulatory Visit: Payer: Self-pay | Admitting: Pediatrics

## 2016-03-31 DIAGNOSIS — J309 Allergic rhinitis, unspecified: Secondary | ICD-10-CM

## 2016-05-24 ENCOUNTER — Ambulatory Visit (INDEPENDENT_AMBULATORY_CARE_PROVIDER_SITE_OTHER): Payer: BLUE CROSS/BLUE SHIELD | Admitting: Pediatrics

## 2016-05-24 ENCOUNTER — Encounter: Payer: Self-pay | Admitting: Pediatrics

## 2016-05-24 VITALS — BP 109/73 | HR 96 | Temp 97.3°F | Ht 61.0 in | Wt 134.0 lb

## 2016-05-24 DIAGNOSIS — J441 Chronic obstructive pulmonary disease with (acute) exacerbation: Secondary | ICD-10-CM

## 2016-05-24 DIAGNOSIS — I10 Essential (primary) hypertension: Secondary | ICD-10-CM | POA: Diagnosis not present

## 2016-05-24 DIAGNOSIS — Z720 Tobacco use: Secondary | ICD-10-CM | POA: Diagnosis not present

## 2016-05-24 MED ORDER — ALBUTEROL SULFATE HFA 108 (90 BASE) MCG/ACT IN AERS: 2.0000 | INHALATION_SPRAY | Freq: Four times a day (QID) | RESPIRATORY_TRACT | 3 refills | Status: DC | PRN

## 2016-05-24 MED ORDER — PREDNISONE 20 MG PO TABS: ORAL_TABLET | ORAL | 0 refills | Status: DC

## 2016-05-24 MED ORDER — GUAIFENESIN-CODEINE 100-10 MG/5ML PO SOLN: 5.0000 mL | Freq: Three times a day (TID) | ORAL | 0 refills | Status: DC | PRN

## 2016-05-24 MED ORDER — AZITHROMYCIN 250 MG PO TABS
ORAL_TABLET | ORAL | 0 refills | Status: DC
Start: 2016-05-24 — End: 2016-08-15

## 2016-05-24 NOTE — Patient Instructions (Signed)
Albuterol three times a day

## 2016-05-24 NOTE — Progress Notes (Signed)
  Subjective:   Patient ID: Amanda York, female    DOB: [birth year], 51 y.o.   MRN: [id number] CC: COPD  HPI: Amanda York is a 51 y.o. female presenting for COPD  Using albuterol more often, at least three times a day Taking spiriva every Coughing a lot Keeping awake at night Lots of phlegm Now yellow, thick sputum Feels SOB when up and moving Feels hot from coughing some  Tobacco use: decreasing use Less than half a pack a day  Mood has been fine Grandsons doing well  HTN: no lightheadedness or dizziness, checks at home  Relevant past medical, surgical, family and social history reviewed. Allergies and medications reviewed and updated. History  Smoking Status  . Current Every Day Smoker  . Packs/day: 1.00  . Types: Cigarettes  Smokeless Tobacco  . Never Used    Comment: Since age 63   ROS: Per HPI   Objective:    BP 109/73   Pulse 96   Temp 97.3 F (36.3 C) (Oral)   Ht 5\' 1"  (1.549 m)   Wt 134 lb (60.8 kg)   SpO2 95%   BMI 25.32 kg/m   Wt Readings from Last 3 Encounters:  05/24/16 134 lb (60.8 kg)  01/31/16 127 lb 3.2 oz (57.7 kg)  04/28/15 121 lb 12.8 oz (55.2 kg)    Gen: NAD, alert, cooperative with exam, NCAT EYES: EOMI, no conjunctival injection, or no icterus ENT:  TMs pearly gray b/l, OP without erythema LYMPH: no cervical LAD CV: NRRR, normal S1/S2, no murmur, distal pulses 2+ b/l Resp: moving air fair, prolonged expiratory phase, end of expiration wheeze, wheeze also present with forced expiration, comfortable WOB Ext: No edema, warm Neuro: Alert and oriented Assessment & Plan:  Destony was seen today for copd.  Diagnoses and all orders for this visit:  COPD exacerbation ([place]) Wheezing on exam, O2 to 95% Increased SOB, cough, sputum, change in sputum color Albuterol three times daily while sick, start pred, azithro for COPD exacerbation -     albuterol (PROVENTIL HFA;VENTOLIN HFA) 108 (90 Base) MCG/ACT inhaler; Inhale 2 puffs into the  lungs every 6 (six) hours as needed for wheezing or shortness of breath. -     predniSONE (DELTASONE) 20 MG tablet; 2 po at same time daily for 5 days -     azithromycin (ZITHROMAX) 250 MG tablet; Take 2 the first day and then one each day after. -     guaiFENesin-codeine 100-10 MG/5ML syrup; Take 5-10 mLs by mouth 3 (three) times daily as needed for cough.  Essential hypertension Well controlled if not on low side today Check BPs before taking BP med next few days Has had decreased PO while sick  Tobacco abuse Decreasing amount, cont to work on cessation Discussed strategies  Follow up plan: Return in about 3 months (around 08/21/2016). [name], MD [place]

## 2016-06-28 ENCOUNTER — Encounter: Payer: BLUE CROSS/BLUE SHIELD | Admitting: *Deleted

## 2016-08-15 ENCOUNTER — Emergency Department (HOSPITAL_COMMUNITY): Payer: BLUE CROSS/BLUE SHIELD

## 2016-08-15 ENCOUNTER — Encounter (HOSPITAL_COMMUNITY): Payer: Self-pay | Admitting: Emergency Medicine

## 2016-08-15 ENCOUNTER — Emergency Department (HOSPITAL_COMMUNITY)
Admission: EM | Admit: 2016-08-15 | Discharge: 2016-08-15 | Disposition: A | Discharge status: Home / Self Care | Payer: BLUE CROSS/BLUE SHIELD | Attending: Emergency Medicine | Admitting: Emergency Medicine

## 2016-08-15 DIAGNOSIS — E039 Hypothyroidism, unspecified: Secondary | ICD-10-CM | POA: Insufficient documentation

## 2016-08-15 DIAGNOSIS — S0083XA Contusion of other part of head, initial encounter: Secondary | ICD-10-CM | POA: Insufficient documentation

## 2016-08-15 DIAGNOSIS — S2002XA Contusion of left breast, initial encounter: Secondary | ICD-10-CM | POA: Diagnosis not present

## 2016-08-15 DIAGNOSIS — M25512 Pain in left shoulder: Secondary | ICD-10-CM | POA: Diagnosis not present

## 2016-08-15 DIAGNOSIS — S3992XA Unspecified injury of lower back, initial encounter: Secondary | ICD-10-CM | POA: Diagnosis not present

## 2016-08-15 DIAGNOSIS — Z79899 Other long term (current) drug therapy: Secondary | ICD-10-CM | POA: Diagnosis not present

## 2016-08-15 DIAGNOSIS — S40012A Contusion of left shoulder, initial encounter: Secondary | ICD-10-CM | POA: Insufficient documentation

## 2016-08-15 DIAGNOSIS — Y9389 Activity, other specified: Secondary | ICD-10-CM | POA: Diagnosis not present

## 2016-08-15 DIAGNOSIS — W28XXXA Contact with powered lawn mower, initial encounter: Secondary | ICD-10-CM | POA: Insufficient documentation

## 2016-08-15 DIAGNOSIS — S199XXA Unspecified injury of neck, initial encounter: Secondary | ICD-10-CM | POA: Diagnosis not present

## 2016-08-15 DIAGNOSIS — T07XXXA Unspecified multiple injuries, initial encounter: Secondary | ICD-10-CM

## 2016-08-15 DIAGNOSIS — S59912A Unspecified injury of left forearm, initial encounter: Secondary | ICD-10-CM | POA: Diagnosis not present

## 2016-08-15 DIAGNOSIS — S50812A Abrasion of left forearm, initial encounter: Secondary | ICD-10-CM | POA: Insufficient documentation

## 2016-08-15 DIAGNOSIS — I10 Essential (primary) hypertension: Secondary | ICD-10-CM | POA: Diagnosis not present

## 2016-08-15 DIAGNOSIS — S4992XA Unspecified injury of left shoulder and upper arm, initial encounter: Secondary | ICD-10-CM

## 2016-08-15 DIAGNOSIS — F17210 Nicotine dependence, cigarettes, uncomplicated: Secondary | ICD-10-CM | POA: Diagnosis not present

## 2016-08-15 DIAGNOSIS — R51 Headache: Secondary | ICD-10-CM | POA: Diagnosis not present

## 2016-08-15 DIAGNOSIS — Y929 Unspecified place or not applicable: Secondary | ICD-10-CM | POA: Insufficient documentation

## 2016-08-15 DIAGNOSIS — J449 Chronic obstructive pulmonary disease, unspecified: Secondary | ICD-10-CM | POA: Insufficient documentation

## 2016-08-15 DIAGNOSIS — Y999 Unspecified external cause status: Secondary | ICD-10-CM | POA: Insufficient documentation

## 2016-08-15 DIAGNOSIS — S0990XA Unspecified injury of head, initial encounter: Secondary | ICD-10-CM | POA: Diagnosis not present

## 2016-08-15 DIAGNOSIS — Z7982 Long term (current) use of aspirin: Secondary | ICD-10-CM | POA: Insufficient documentation

## 2016-08-15 DIAGNOSIS — S4991XA Unspecified injury of right shoulder and upper arm, initial encounter: Secondary | ICD-10-CM | POA: Diagnosis not present

## 2016-08-15 DIAGNOSIS — M79632 Pain in left forearm: Secondary | ICD-10-CM | POA: Diagnosis not present

## 2016-08-15 MED ORDER — NAPROXEN 500 MG PO TABS: 500.0000 mg | ORAL_TABLET | Freq: Two times a day (BID) | ORAL | 0 refills | Status: DC

## 2016-08-15 MED ORDER — BACITRACIN-NEOMYCIN-POLYMYXIN 400-5-5000 EX OINT
TOPICAL_OINTMENT | Freq: Once | CUTANEOUS | Status: DC
  Filled 2016-08-15: qty 1

## 2016-08-15 MED ORDER — METHOCARBAMOL 500 MG PO TABS: 500.0000 mg | ORAL_TABLET | Freq: Three times a day (TID) | ORAL | 0 refills | Status: DC

## 2016-08-15 NOTE — Discharge Instructions (Signed)
Apply ice packs on/off to your shoulder and neck.  Follow-up with your primary doctor or with Dr. Ruthe Mannan office for recheck.  Return to [facility] for any worsening symptoms
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
## 2016-08-15 NOTE — ED Notes (Signed)
Patient given discharge instruction, verbalized understand. Patient ambulatory out of the department.  
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
## 2016-08-27 ENCOUNTER — Other Ambulatory Visit: Payer: Self-pay | Admitting: Pediatrics

## 2016-08-27 DIAGNOSIS — F32A Depression, unspecified: Secondary | ICD-10-CM

## 2016-08-27 DIAGNOSIS — E039 Hypothyroidism, unspecified: Secondary | ICD-10-CM

## 2016-08-27 DIAGNOSIS — F329 Major depressive disorder, single episode, unspecified: Secondary | ICD-10-CM

## 2016-09-05 ENCOUNTER — Telehealth: Payer: Self-pay | Admitting: Pediatrics

## 2016-09-05 DIAGNOSIS — E039 Hypothyroidism, unspecified: Secondary | ICD-10-CM

## 2016-09-05 MED ORDER — LEVOTHYROXINE SODIUM 25 MCG PO TABS: ORAL_TABLET | ORAL | 0 refills | Status: DC

## 2016-09-05 NOTE — Telephone Encounter (Signed)
What is the name of the medication? levothyroxine (SYNTHROID, LEVOTHROID) 25 MCG tablet  Have you contacted your pharmacy to request a refill? Yes, was told to call the office  Which pharmacy would you like this sent to? [facility] in [place].   Patient notified that their request is being sent to the clinical staff for review and that they should receive a call once it is complete. If they do not receive a call within 24 hours they can check with their pharmacy or our office.

## 2016-09-05 NOTE — Telephone Encounter (Signed)
Refill for levothyroxine sent to pharmacy. Enough sent to cover until appt with Dr. Evette Doffing

## 2016-09-15 ENCOUNTER — Encounter: Payer: Self-pay | Admitting: Pediatrics

## 2016-09-15 ENCOUNTER — Other Ambulatory Visit: Payer: Self-pay | Admitting: Pediatrics

## 2016-09-15 ENCOUNTER — Ambulatory Visit (INDEPENDENT_AMBULATORY_CARE_PROVIDER_SITE_OTHER): Payer: BLUE CROSS/BLUE SHIELD | Admitting: Pediatrics

## 2016-09-15 VITALS — BP 128/88 | HR 91 | Temp 97.0°F | Ht 61.0 in | Wt 135.6 lb

## 2016-09-15 DIAGNOSIS — M5441 Lumbago with sciatica, right side: Secondary | ICD-10-CM

## 2016-09-15 DIAGNOSIS — E039 Hypothyroidism, unspecified: Secondary | ICD-10-CM

## 2016-09-15 DIAGNOSIS — F419 Anxiety disorder, unspecified: Secondary | ICD-10-CM | POA: Diagnosis not present

## 2016-09-15 DIAGNOSIS — G8929 Other chronic pain: Secondary | ICD-10-CM

## 2016-09-15 DIAGNOSIS — G4700 Insomnia, unspecified: Secondary | ICD-10-CM

## 2016-09-15 DIAGNOSIS — Z1231 Encounter for screening mammogram for malignant neoplasm of breast: Secondary | ICD-10-CM

## 2016-09-15 DIAGNOSIS — M5442 Lumbago with sciatica, left side: Secondary | ICD-10-CM

## 2016-09-15 DIAGNOSIS — Z0000 Encounter for general adult medical examination without abnormal findings: Secondary | ICD-10-CM | POA: Diagnosis not present

## 2016-09-15 DIAGNOSIS — Z1211 Encounter for screening for malignant neoplasm of colon: Secondary | ICD-10-CM | POA: Diagnosis not present

## 2016-09-15 DIAGNOSIS — Z720 Tobacco use: Secondary | ICD-10-CM

## 2016-09-15 DIAGNOSIS — I10 Essential (primary) hypertension: Secondary | ICD-10-CM

## 2016-09-15 DIAGNOSIS — J449 Chronic obstructive pulmonary disease, unspecified: Secondary | ICD-10-CM

## 2016-09-15 LAB — FECAL OCCULT BLOOD, IMMUNOCHEMICAL: Fecal Occult Bld: NEGATIVE

## 2016-09-15 MED ORDER — SUVOREXANT 10 MG PO TABS: 10.0000 mg | ORAL_TABLET | Freq: Every evening | ORAL | 0 refills | Status: DC | PRN

## 2016-09-15 MED ORDER — LEVOTHYROXINE SODIUM 25 MCG PO TABS: ORAL_TABLET | ORAL | 1 refills | Status: DC

## 2016-09-15 MED ORDER — SERTRALINE HCL 50 MG PO TABS: 50.0000 mg | ORAL_TABLET | Freq: Every day | ORAL | 3 refills | Status: DC

## 2016-09-15 MED ORDER — LISINOPRIL 10 MG PO TABS: 10.0000 mg | ORAL_TABLET | Freq: Every day | ORAL | 1 refills | Status: DC

## 2016-09-15 MED ORDER — TIOTROPIUM BROMIDE MONOHYDRATE 18 MCG IN CAPS: 18.0000 ug | ORAL_CAPSULE | Freq: Every day | RESPIRATORY_TRACT | 1 refills | Status: DC | PRN

## 2016-09-15 MED ORDER — NAPROXEN 500 MG PO TABS: 500.0000 mg | ORAL_TABLET | Freq: Two times a day (BID) | ORAL | 1 refills | Status: DC | PRN

## 2016-09-15 NOTE — Patient Instructions (Signed)
[facility] Appointment: [phone]  
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
## 2016-09-19 LAB — PAP IG AND HPV HIGH-RISK
HPV, HIGH-RISK: NEGATIVE
PAP Smear Comment: 0

## 2016-09-20 ENCOUNTER — Encounter (HOSPITAL_COMMUNITY): Payer: BLUE CROSS/BLUE SHIELD

## 2016-09-21 ENCOUNTER — Telehealth: Payer: Self-pay

## 2016-09-21 ENCOUNTER — Other Ambulatory Visit: Payer: Self-pay | Admitting: *Deleted

## 2016-09-21 ENCOUNTER — Other Ambulatory Visit: Payer: Self-pay | Admitting: Pediatrics

## 2016-09-21 ENCOUNTER — Other Ambulatory Visit: Payer: Self-pay

## 2016-09-21 DIAGNOSIS — R928 Other abnormal and inconclusive findings on diagnostic imaging of breast: Secondary | ICD-10-CM

## 2016-09-21 DIAGNOSIS — N63 Unspecified lump in unspecified breast: Secondary | ICD-10-CM

## 2016-09-21 NOTE — Telephone Encounter (Signed)
Patient notified and agreed to continue with treatment of anxiety and see if that helps with sleep.

## 2016-09-21 NOTE — Progress Notes (Unsigned)
mam

## 2016-09-21 NOTE — Telephone Encounter (Signed)
Insurance denied the sleep medication that we tried and the alternatives all can have significant side effects. Lets treat your anxiety as we discussed and that should help with sleep.

## 2016-09-27 ENCOUNTER — Ambulatory Visit (HOSPITAL_COMMUNITY)
Admission: RE | Admit: 2016-09-27 | Discharge: 2016-09-27 | Disposition: A | Discharge status: Home / Self Care | Payer: BLUE CROSS/BLUE SHIELD | Source: Ambulatory Visit | Attending: Pediatrics | Admitting: Pediatrics

## 2016-09-27 DIAGNOSIS — G8929 Other chronic pain: Secondary | ICD-10-CM | POA: Insufficient documentation

## 2016-09-27 DIAGNOSIS — M5126 Other intervertebral disc displacement, lumbar region: Secondary | ICD-10-CM | POA: Diagnosis not present

## 2016-09-27 DIAGNOSIS — M5442 Lumbago with sciatica, left side: Secondary | ICD-10-CM | POA: Insufficient documentation

## 2016-09-27 DIAGNOSIS — M5124 Other intervertebral disc displacement, thoracic region: Secondary | ICD-10-CM | POA: Insufficient documentation

## 2016-09-27 DIAGNOSIS — M48061 Spinal stenosis, lumbar region without neurogenic claudication: Secondary | ICD-10-CM | POA: Insufficient documentation

## 2016-09-27 DIAGNOSIS — M5441 Lumbago with sciatica, right side: Secondary | ICD-10-CM | POA: Insufficient documentation

## 2016-09-27 DIAGNOSIS — M545 Low back pain: Secondary | ICD-10-CM | POA: Diagnosis not present

## 2016-09-29 ENCOUNTER — Telehealth: Payer: Self-pay | Admitting: Pediatrics

## 2016-09-29 DIAGNOSIS — M5442 Lumbago with sciatica, left side: Principal | ICD-10-CM

## 2016-09-29 DIAGNOSIS — M5441 Lumbago with sciatica, right side: Principal | ICD-10-CM

## 2016-09-29 DIAGNOSIS — G8929 Other chronic pain: Secondary | ICD-10-CM

## 2016-09-29 NOTE — Telephone Encounter (Signed)
MRI with bulging discs, back pain, leg pain. Will refer for further eval

## 2016-10-03 ENCOUNTER — Encounter (HOSPITAL_COMMUNITY): Payer: BLUE CROSS/BLUE SHIELD

## 2016-10-03 ENCOUNTER — Ambulatory Visit (HOSPITAL_COMMUNITY)
Admission: RE | Admit: 2016-10-03 | Discharge: 2016-10-03 | Disposition: A | Discharge status: Home / Self Care | Payer: BLUE CROSS/BLUE SHIELD | Source: Ambulatory Visit | Attending: Pediatrics | Admitting: Pediatrics

## 2016-10-03 DIAGNOSIS — R928 Other abnormal and inconclusive findings on diagnostic imaging of breast: Secondary | ICD-10-CM

## 2016-11-22 ENCOUNTER — Telehealth: Payer: Self-pay | Admitting: Pediatrics

## 2016-11-22 ENCOUNTER — Other Ambulatory Visit: Payer: Self-pay

## 2016-11-22 MED ORDER — SERTRALINE HCL 100 MG PO TABS: 100.0000 mg | ORAL_TABLET | Freq: Every day | ORAL | 2 refills | Status: DC

## 2016-11-22 NOTE — Telephone Encounter (Signed)
Patient aware, new prescription sent to her pharmacy

## 2016-11-22 NOTE — Telephone Encounter (Signed)
Patient states that the sertaline 50mg  is not working and would like something else sent in to pharmacy. Please advise.

## 2016-11-22 NOTE — Telephone Encounter (Signed)
Increase to 100mg  sertraline, 50mg  is the starting dose. Can you send in #30 tabs with 2 refills? Schedule f/u with me in 2 mo. If not noticing different in 4 weeks let me know.

## 2016-11-28 ENCOUNTER — Other Ambulatory Visit: Payer: Self-pay | Admitting: Pediatrics

## 2016-11-28 NOTE — Telephone Encounter (Signed)
appt made

## 2016-12-06 ENCOUNTER — Ambulatory Visit (INDEPENDENT_AMBULATORY_CARE_PROVIDER_SITE_OTHER): Payer: BLUE CROSS/BLUE SHIELD | Admitting: Pediatrics

## 2016-12-06 ENCOUNTER — Encounter: Payer: Self-pay | Admitting: Pediatrics

## 2016-12-06 VITALS — BP 131/85 | HR 68 | Temp 98.3°F | Ht 61.0 in | Wt 127.0 lb

## 2016-12-06 DIAGNOSIS — R232 Flushing: Secondary | ICD-10-CM | POA: Diagnosis not present

## 2016-12-06 DIAGNOSIS — F32A Depression, unspecified: Secondary | ICD-10-CM

## 2016-12-06 DIAGNOSIS — F410 Panic disorder [episodic paroxysmal anxiety] without agoraphobia: Secondary | ICD-10-CM

## 2016-12-06 DIAGNOSIS — Z720 Tobacco use: Secondary | ICD-10-CM

## 2016-12-06 DIAGNOSIS — I10 Essential (primary) hypertension: Secondary | ICD-10-CM

## 2016-12-06 DIAGNOSIS — F329 Major depressive disorder, single episode, unspecified: Secondary | ICD-10-CM

## 2016-12-06 DIAGNOSIS — R454 Irritability and anger: Secondary | ICD-10-CM | POA: Diagnosis not present

## 2016-12-06 MED ORDER — ESCITALOPRAM OXALATE 10 MG PO TABS: 10.0000 mg | ORAL_TABLET | Freq: Every day | ORAL | 3 refills | Status: DC

## 2016-12-06 NOTE — Progress Notes (Signed)
  Subjective:   Patient ID: Amanda York, female    DOB: 11-Jan-1966, 51 y.o.   MRN: [id number] CC: Wants to discuss med changes  HPI: [name] is a 51 y.o. female presenting for Wants to discuss med changes  Up to 100mg  sertraline, started feeling shaky Didn't help with mood Went down to 25mg  bc she wanted to come off of it Says daughter says she has been more irritable, "hostile" No thoughts of self harm  Continues to have daily hotflashes, present daytime and night  Continues to smoke regulalry, says a littl emore than last visit  No fevers Normal appetite Weight some, trying to lose weight  Relevant past medical, surgical, family and social history reviewed. Allergies and medications reviewed and updated. History  Smoking Status  . Current Every Day Smoker  . Packs/day: 1.00  . Types: Cigarettes  Smokeless Tobacco  . Never Used    Comment: Since age 33   ROS: Per HPI   Objective:    BP 131/85   Pulse 68   Temp 98.3 F (36.8 C) (Oral)   Ht 5\' 1"  (1.549 m)   Wt 127 lb (57.6 kg)   BMI 24.00 kg/m   Wt Readings from Last 3 Encounters:  12/06/16 127 lb (57.6 kg)  09/15/16 135 lb 9.6 oz (61.5 kg)  08/15/16 130 lb (59 kg)    Gen: NAD, alert, cooperative with exam, NCAT EYES: EOMI, no conjunctival injection, or no icterus ENT: OP without erythema LYMPH: no cervical LAD CV: NRRR, normal S1/S2, no murmur, distal pulses 2+ b/l Resp: CTABL, no wheezes, normal WOB Abd: +BS, soft, NTND.  Ext: No edema, warm Neuro: Alert and oriented, strength equal b/l UE and LE, coordination grossly normal MSK: normal muscle bulk Psych: full affect, no thoughts of self harm  Assessment & Plan:  [name] was seen today for wants to discuss med changes.  Diagnoses and all orders for this visit:  Hot flashes Start lexapro -     escitalopram (LEXAPRO) 10 MG tablet; Take 1 tablet (10 mg total) by mouth daily.  Irritable  Panic attack Ongoing, treat as  above  Depression, unspecified depression type  Tobacco abuse Ongoing, cont to encourage cessation  Essential hypertension Adequate control, cont current meds  Follow up plan: Return in about 6 weeks (around 01/17/2017). [name], MD [place]

## 2017-01-22 ENCOUNTER — Encounter: Payer: Self-pay | Admitting: Pediatrics

## 2017-01-22 ENCOUNTER — Ambulatory Visit: Payer: BLUE CROSS/BLUE SHIELD | Admitting: Pediatrics

## 2017-01-22 ENCOUNTER — Ambulatory Visit (INDEPENDENT_AMBULATORY_CARE_PROVIDER_SITE_OTHER): Payer: BLUE CROSS/BLUE SHIELD | Admitting: Pediatrics

## 2017-01-22 VITALS — BP 136/84 | HR 82 | Temp 97.2°F | Ht 61.0 in | Wt 125.4 lb

## 2017-01-22 DIAGNOSIS — F329 Major depressive disorder, single episode, unspecified: Secondary | ICD-10-CM | POA: Diagnosis not present

## 2017-01-22 DIAGNOSIS — I10 Essential (primary) hypertension: Secondary | ICD-10-CM | POA: Diagnosis not present

## 2017-01-22 DIAGNOSIS — R232 Flushing: Secondary | ICD-10-CM | POA: Diagnosis not present

## 2017-01-22 DIAGNOSIS — F419 Anxiety disorder, unspecified: Secondary | ICD-10-CM | POA: Diagnosis not present

## 2017-01-22 DIAGNOSIS — F32A Depression, unspecified: Secondary | ICD-10-CM

## 2017-01-22 MED ORDER — BUSPIRONE HCL 5 MG PO TABS: 5.0000 mg | ORAL_TABLET | Freq: Three times a day (TID) | ORAL | 3 refills | Status: DC

## 2017-01-22 MED ORDER — ESCITALOPRAM OXALATE 20 MG PO TABS: 20.0000 mg | ORAL_TABLET | Freq: Every day | ORAL | 3 refills | Status: DC

## 2017-01-22 MED ORDER — LISINOPRIL 10 MG PO TABS: 10.0000 mg | ORAL_TABLET | Freq: Every day | ORAL | 1 refills | Status: DC

## 2017-01-22 NOTE — Progress Notes (Signed)
  Subjective:   Patient ID: Amanda York, female    DOB: [birth year], 51 y.o.   MRN: [id number] CC: Follow-up (2 month, Depression)  HPI: Amanda York is a 51 y.o. female presenting for Follow-up (2 month, Depression)  Depression and Anxiety: starting on escitalopram helped with depression Still with anxiety symptoms Taking care of 52yo and 31yo grandsons, guardian Worries about them getting hurt excessively No thoughts of self harm or not wanting to be here  HTN: no CP, no SOB Taking meds regularly  Hot flashes: improved on lexapro  Back pain has improved Had to cancel appt with neurosurgery, has not yet rescheduled but thinks she is doing OK for now  Relevant past medical, surgical, family and social history reviewed. Allergies and medications reviewed and updated. History  Smoking Status  . Current Every Day Smoker  . Packs/day: 1.00  . Types: Cigarettes  Smokeless Tobacco  . Never Used    Comment: Since age 32   ROS: Per HPI   Objective:    BP 136/84   Pulse 82   Temp (!) 97.2 F (36.2 C) (Oral)   Ht 5\' 1"  (1.549 m)   Wt 125 lb 6.4 oz (56.9 kg)   BMI 23.69 kg/m   Wt Readings from Last 3 Encounters:  01/22/17 125 lb 6.4 oz (56.9 kg)  12/06/16 127 lb (57.6 kg)  09/15/16 135 lb 9.6 oz (61.5 kg)   Gen: NAD, alert, cooperative with exam, NCAT EYES: EOMI, no conjunctival injection, or no icterus ENT:  OP without erythema CV: NRRR, normal S1/S2, no murmur, distal pulses 2+ b/l Resp: CTABL, no wheezes, normal WOB Abd: +BS, soft, NTND. no guarding or organomegaly Ext: No edema, warm Neuro: Alert and oriented Psych: nl affect, no thoughts of SI  Assessment & Plan:  Amanda York was seen today for follow-up anxiety.  Diagnoses and all orders for this visit:  Anxiety Ongoing symptoms Increase lexapro Start below Strongly encourgaed counseling Gave list of counselors, parents as teachers as a resource  -     busPIRone (BUSPAR) 5 MG tablet; Take 1 tablet (5 mg  total) by mouth 3 (three) times daily.  Hot flashes Improved with below, cont  -     escitalopram (LEXAPRO) 20 MG tablet; Take 1 tablet (20 mg total) by mouth daily.  Essential hypertension Slightly elevated today, cont lisinopril -     lisinopril (PRINIVIL,ZESTRIL) 10 MG tablet; Take 1 tablet (10 mg total) by mouth daily.  Depression, unspecified depression type Feels safe at home, cont lexapro  Follow up plan: Return in about 3 months (around 04/24/2017). [name], MD [place]

## 2017-01-22 NOTE — Patient Instructions (Addendum)
Parents as Teachers Program in [place]: [phone] If you have any trouble let me know, I can also sign you up if you want.  [email or web address]  Your provider wants you to schedule an appointment with a Psychologist/Psychiatrist. The following list of offices requires the patient to call and make their own appointment, as there is information they need that only you can provide. Please feel free to choose form the following providers:  [facility] in [place]  [facility]  [phone] [place], [place]  (Scheduled through [place]) Must call and do an interview for appointment. Sees Children / Accepts Medicaid  [facility] in [place]  [address], [place]    [place], [place]  [phone] [place], [place] for Autism but does not treat it Sees Children / Accepts Medicaid  [facility]    [phone] [address], [place], [place] Medication management, substance abuse, bipolar, grief, family, marriage, OCD, anxiety, PTSD Sees children / Accepts Medicaid  [facility]    [phone] [address], [place], [place] children / Accepts [facility]  [facility]  [phone] [address] [place], [place]   Dr Lorenza Evangelist     [phone] [address], [place], [place]  Sees ADD & ADHD for treatment Accepts Medicaid  [facility]  [phone] [address] Arlean Hopping, [place] for Autism Accepts [facility]  [facility]  [phone] [place], [place]  Does Adult ADD evaluations Does not accept Medicaid  [facility]   [phone] [place], [place] children as young as 52 years old Accepts  [facility]     [phone]    [place], [place] [address] Sees children Accepts Medicaid

## 2017-03-08 ENCOUNTER — Other Ambulatory Visit: Payer: Self-pay | Admitting: Pediatrics

## 2017-03-08 DIAGNOSIS — E039 Hypothyroidism, unspecified: Secondary | ICD-10-CM

## 2017-03-08 NOTE — Telephone Encounter (Signed)
OV 04/26/17 

## 2017-04-02 ENCOUNTER — Other Ambulatory Visit: Payer: Self-pay | Admitting: Pediatrics

## 2017-04-02 DIAGNOSIS — J309 Allergic rhinitis, unspecified: Secondary | ICD-10-CM

## 2017-04-26 ENCOUNTER — Encounter: Payer: Self-pay | Admitting: Pediatrics

## 2017-04-26 ENCOUNTER — Ambulatory Visit: Payer: BLUE CROSS/BLUE SHIELD | Admitting: Pediatrics

## 2017-04-26 VITALS — BP 110/74 | HR 79 | Temp 97.5°F | Ht 61.0 in | Wt 127.2 lb

## 2017-04-26 DIAGNOSIS — R232 Flushing: Secondary | ICD-10-CM | POA: Diagnosis not present

## 2017-04-26 DIAGNOSIS — Z720 Tobacco use: Secondary | ICD-10-CM

## 2017-04-26 DIAGNOSIS — F419 Anxiety disorder, unspecified: Secondary | ICD-10-CM | POA: Diagnosis not present

## 2017-04-26 DIAGNOSIS — J449 Chronic obstructive pulmonary disease, unspecified: Secondary | ICD-10-CM | POA: Diagnosis not present

## 2017-04-26 DIAGNOSIS — I10 Essential (primary) hypertension: Secondary | ICD-10-CM

## 2017-04-26 MED ORDER — ALBUTEROL SULFATE HFA 108 (90 BASE) MCG/ACT IN AERS: 2.0000 | INHALATION_SPRAY | Freq: Four times a day (QID) | RESPIRATORY_TRACT | 3 refills | Status: DC | PRN

## 2017-04-26 MED ORDER — BUSPIRONE HCL 5 MG PO TABS: 5.0000 mg | ORAL_TABLET | Freq: Two times a day (BID) | ORAL | 3 refills | Status: DC

## 2017-04-26 MED ORDER — LISINOPRIL 10 MG PO TABS: 10.0000 mg | ORAL_TABLET | Freq: Every day | ORAL | 1 refills | Status: DC

## 2017-04-26 MED ORDER — ESCITALOPRAM OXALATE 20 MG PO TABS: 20.0000 mg | ORAL_TABLET | Freq: Every day | ORAL | 3 refills | Status: DC

## 2017-04-26 MED ORDER — BUSPIRONE HCL 5 MG PO TABS: 5.0000 mg | ORAL_TABLET | Freq: Three times a day (TID) | ORAL | 3 refills | Status: DC

## 2017-04-26 MED ORDER — VARENICLINE TARTRATE 1 MG PO TABS: 1.0000 mg | ORAL_TABLET | Freq: Two times a day (BID) | ORAL | 1 refills | Status: DC

## 2017-04-26 MED ORDER — VARENICLINE TARTRATE 0.5 MG X 11 & 1 MG X 42 PO MISC: ORAL | 0 refills | Status: DC

## 2017-04-26 NOTE — Progress Notes (Signed)
Subjective:   Patient ID: Amanda York, female    DOB: January 23, 1966, 52 y.o.   MRN: [id number] CC: Follow-up (3 month) Multiple medical problems HPI: Amanda York is a 52 y.o. female presenting for Follow-up (3 month)  Depression: Says she has been feeling great, best she has felt in years.  Taking Lexapro regularly. Did not start counseling  Anxiety: Well controlled.  Taking BuSpar twice a day, 5 mg.  Tobacco use: Wants to quit smoking, has picked a quit date of February 1 which is her grandson's birthday  Hot flashes: Improved, symptoms controlled.  COPD: Sometimes feels winded with exertion, improves with albuterol.  Is looking forward to quitting as above.  No one else at home smokes right now.  HTN: Taking medicine regularly, no chest pain, no headaches, no lightheadedness  Relevant past medical, surgical, family and social history reviewed. Allergies and medications reviewed and updated. Social History   Tobacco Use  Smoking Status Current Every Day Smoker  . Packs/day: 1.00  . Types: Cigarettes  Smokeless Tobacco Never Used  Tobacco Comment   Since age 32   ROS: Per HPI   Objective:    BP 110/74   Pulse 79   Temp (!) 97.5 F (36.4 C) (Oral)   Ht 5\' 1"  (1.549 m)   Wt 127 lb 3.2 oz (57.7 kg)   BMI 24.03 kg/m   Wt Readings from Last 3 Encounters:  04/26/17 127 lb 3.2 oz (57.7 kg)  01/22/17 125 lb 6.4 oz (56.9 kg)  12/06/16 127 lb (57.6 kg)    Gen: NAD, alert, cooperative with exam, NCAT EYES: EOMI, no conjunctival injection, or no icterus ENT:  TMs pearly gray with white serous effusion b/l, OP without erythema LYMPH: no cervical LAD CV: NRRR, normal S1/S2, no murmur, distal pulses 2+ b/l Resp: CTABL, no wheezes, normal WOB Ext: No edema, warm Neuro: Alert and oriented, strength equal b/l UE and LE, coordination grossly normal Psych: Normal affect, mood is "great"  Assessment & Plan:  Amanda York was seen today for follow-up.  Diagnoses and all orders  for this visit:  Tobacco use Counseling given, discussed strategies, prescription for starting month and continuing with Chantix given, follow-up in 3 months, sooner if needed. -     varenicline (CHANTIX) 1 MG tablet; Take 1 tablet (1 mg total) by mouth 2 (two) times daily. -     varenicline (CHANTIX PAK) 0.5 MG X 11 & 1 MG X 42 tablet; Take one 0.5 mg tab once daily for 3 days, then increase to one 0.5 mg tab twice daily for 4 days, then increase to one 1 mg tab twice daily  Hot flashes Improved, continue below -     escitalopram (LEXAPRO) 20 MG tablet; Take 1 tablet (20 mg total) by mouth daily.  Anxiety Continue below twice a day -     busPIRone (BUSPAR) 5 MG tablet; Take 1 tablet (5 mg total) by mouth twice daily.  Essential hypertension Well-controlled, continue below Due for labs -     lisinopril (PRINIVIL,ZESTRIL) 10 MG tablet; Take 1 tablet (10 mg total) by mouth daily. -     Basic Metabolic Panel  Chronic obstructive pulmonary disease, unspecified COPD type ([place]) Normal exam today, symptoms under good control.  Continue below as needed, regularly using needs to be seen. -     albuterol (PROVENTIL HFA;VENTOLIN HFA) 108 (90 Base) MCG/ACT inhaler; Inhale 2 puffs into the lungs every 6 (six) hours as needed for wheezing or shortness  of breath.   Follow up plan: Return in about 3 months (around 07/25/2017). [name], MD [place]

## 2017-04-27 LAB — BASIC METABOLIC PANEL
BUN/Creatinine Ratio: 6 — ABNORMAL LOW (ref 9–23)
BUN: 4 mg/dL — ABNORMAL LOW (ref 6–24)
CO2: 21 mmol/L (ref 20–29)
Calcium: 9.8 mg/dL (ref 8.7–10.2)
Chloride: 103 mmol/L (ref 96–106)
Creatinine, Ser: 0.71 mg/dL (ref 0.57–1.00)
GFR calc Af Amer: 113 mL/min/{1.73_m2} (ref >59)
GFR, EST NON AFRICAN AMERICAN: 98 mL/min/{1.73_m2} (ref >59)
GLUCOSE: 91 mg/dL (ref 65–99)
POTASSIUM: 4.8 mmol/L (ref 3.5–5.2)
Sodium: 142 mmol/L (ref 134–144)

## 2017-05-03 ENCOUNTER — Other Ambulatory Visit: Payer: Self-pay | Admitting: Pediatrics

## 2017-05-03 DIAGNOSIS — J309 Allergic rhinitis, unspecified: Secondary | ICD-10-CM

## 2017-05-17 ENCOUNTER — Other Ambulatory Visit: Payer: Self-pay | Admitting: Pediatrics

## 2017-05-17 DIAGNOSIS — R232 Flushing: Secondary | ICD-10-CM

## 2017-06-04 ENCOUNTER — Other Ambulatory Visit: Payer: Self-pay | Admitting: Pediatrics

## 2017-06-04 DIAGNOSIS — E039 Hypothyroidism, unspecified: Secondary | ICD-10-CM

## 2017-08-03 ENCOUNTER — Encounter: Payer: Self-pay | Admitting: Family

## 2017-08-03 ENCOUNTER — Ambulatory Visit: Payer: BLUE CROSS/BLUE SHIELD | Admitting: Family

## 2017-08-03 VITALS — BP 118/77 | HR 75 | Temp 97.3°F | Ht 61.0 in | Wt 126.0 lb

## 2017-08-03 DIAGNOSIS — H8112 Benign paroxysmal vertigo, left ear: Secondary | ICD-10-CM | POA: Diagnosis not present

## 2017-08-03 DIAGNOSIS — Z720 Tobacco use: Secondary | ICD-10-CM | POA: Diagnosis not present

## 2017-08-03 DIAGNOSIS — H9312 Tinnitus, left ear: Secondary | ICD-10-CM

## 2017-08-03 MED ORDER — FLUTICASONE PROPIONATE 50 MCG/ACT NA SUSP: 2.0000 | Freq: Every day | NASAL | 6 refills | Status: DC

## 2017-08-03 NOTE — Progress Notes (Signed)
   Subjective:    Patient ID: Amanda York, female    DOB: [birth year], 52 y.o.   MRN: [id number]  Chief Complaint  Patient presents with  . left ear has pressure and drainage   Otalgia   There is pain in both ears. This is a new problem. The current episode started in the past 7 days. The problem occurs constantly. The problem has been gradually worsening. There has been no fever. The pain is at a severity of 1/10 (pressure). Associated symptoms include coughing, ear discharge, hearing loss and rhinorrhea. Pertinent negatives include no headaches or sore throat. Associated symptoms comments: dizziness. Treatments tried: zyrtec. The treatment provided no relief.      Review of Systems  HENT: Positive for ear discharge, ear pain, hearing loss and rhinorrhea. Negative for sore throat.   Respiratory: Positive for cough.   Neurological: Negative for headaches.  All other systems reviewed and are negative.      Objective:   Physical Exam  Constitutional: She is oriented to person, place, and time. She appears well-developed and well-nourished. No distress.  HENT:  Head: Normocephalic and atraumatic.  Right Ear: External ear normal.  Left Ear: External ear normal.  Mouth/Throat: Oropharynx is clear and moist.  Eyes: Pupils are equal, round, and reactive to light.  Neck: Normal range of motion. Neck supple. No thyromegaly present.  Cardiovascular: Normal rate, regular rhythm, normal heart sounds and intact distal pulses.  No murmur heard. Pulmonary/Chest: Effort normal and breath sounds normal. No respiratory distress. She has no wheezes.  Abdominal: Soft. Bowel sounds are normal. She exhibits no distension. There is no tenderness.  Musculoskeletal: Normal range of motion. She exhibits no edema or tenderness.  Neurological: She is alert and oriented to person, place, and time. She has normal reflexes.  Skin: Skin is warm and dry.  Psychiatric: She has a normal mood and affect. Her  behavior is normal. Judgment and thought content normal.  Vitals reviewed.     BP 118/77   Pulse 75   Temp (!) 97.3 F (36.3 C) (Oral)   Ht 5\' 1"  (1.549 m)   Wt 126 lb (57.2 kg)   BMI 23.81 kg/m      Assessment & Plan:  Lyanna was seen today for left ear has pressure and drainage.  Diagnoses and all orders for this visit:  Tinnitus of left ear  Benign paroxysmal positional vertigo of left ear  Tobacco abuse  Other orders -     fluticasone (FLONASE) 50 MCG/ACT nasal spray; Place 2 sprays into both nostrils daily.    Continue zyrtec and start Flonase  Avoid fast position changes  Epley exercises discussed RTO prn or if symptoms worsen or do not improve   Evelina Dun, FNP

## 2017-08-03 NOTE — Patient Instructions (Signed)
Tinnitus Tinnitus refers to hearing a sound when there is no actual source for that sound. This is often described as ringing in the ears. However, people with this condition may hear a variety of noises. A person may hear the sound in one ear or in both ears. The sounds of tinnitus can be soft, loud, or somewhere in between. Tinnitus can last for a few seconds or can be constant for days. It may go away without treatment and come back at various times. When tinnitus is constant or happens often, it can lead to other problems, such as trouble sleeping and trouble concentrating. Almost everyone experiences tinnitus at some point. Tinnitus that is long-lasting (chronic) or comes back often is a problem that may require medical attention. What are the causes? The cause of tinnitus is often not known. In some cases, it can result from other problems or conditions, including:  Exposure to loud noises from machinery, music, or other sources.  Hearing loss.  Ear or sinus infections.  Earwax buildup.  A foreign object in the ear.  Use of certain medicines.  Use of alcohol and caffeine.  High blood pressure.  Heart diseases.  Anemia.  Allergies.  Meniere disease.  Thyroid problems.  Tumors.  An enlarged part of a weakened blood vessel (aneurysm).  What are the signs or symptoms? The main symptom of tinnitus is hearing a sound when there is no source for that sound. It may sound like:  Buzzing.  Roaring.  Ringing.  Blowing air, similar to the sound heard when you listen to a seashell.  Hissing.  Whistling.  Sizzling.  Humming.  Running water.  A sustained musical note.  How is this diagnosed? Tinnitus is diagnosed based on your symptoms. Your health care provider will do a physical exam. A comprehensive hearing exam (audiologic exam) will be done if your tinnitus:  Affects only one ear (unilateral).  Causes hearing difficulties.  Lasts 6 months or  longer.  You may also need to see a health care provider who specializes in hearing disorders (audiologist). You may be asked to complete a questionnaire to determine the severity of your tinnitus. Tests may be done to help determine the cause and to rule out other conditions. These can include:  Imaging studies of your head and brain, such as: ? A CT scan. ? An MRI.  An imaging study of your blood vessels (angiogram).  How is this treated? Treating an underlying medical condition can sometimes make tinnitus go away. If your tinnitus continues, other treatments may include:  Medicines, such as certain antidepressants or sleeping aids.  Sound generators to mask the tinnitus. These include: ? Tabletop sound machines that play relaxing sounds to help you fall asleep. ? Wearable devices that fit in your ear and play sounds or music. ? A small device that uses headphones to deliver a signal embedded in music (acoustic neural stimulation). In time, this may change the pathways of your brain and make you less sensitive to tinnitus. This device is used for very severe cases when no other treatment is working.  Therapy and counseling to help you manage the stress of living with tinnitus.  Using hearing aids or cochlear implants, if your tinnitus is related to hearing loss.  Follow these instructions at home:  When possible, avoid being in loud places and being exposed to loud sounds.  Wear hearing protection, such as earplugs, when you are exposed to loud noises.  Do not take stimulants, such as nicotine,   alcohol, or caffeine.  Practice techniques for reducing stress, such as meditation, yoga, or deep breathing.  Use a white noise machine, a humidifier, or other devices to mask the sound of tinnitus.  Sleep with your head slightly raised. This may reduce the impact of tinnitus.  Try to get plenty of rest each night. Contact a health care provider if:  You have tinnitus in just one  ear.  Your tinnitus continues for 3 weeks or longer without stopping.  Home care measures are not helping.  You have tinnitus after a head injury.  You have tinnitus along with any of the following: ? Dizziness. ? Loss of balance. ? Nausea and vomiting. This information is not intended to replace advice given to you by your health care provider. Make sure you discuss any questions you have with your health care provider. Document Released: 03/20/2005 Document Revised: 11/21/2015 Document Reviewed: 08/20/2013 Elsevier Interactive Patient Education  2018 Elsevier Inc.  

## 2017-08-29 ENCOUNTER — Other Ambulatory Visit: Payer: Self-pay | Admitting: Pediatrics

## 2017-08-29 DIAGNOSIS — E039 Hypothyroidism, unspecified: Secondary | ICD-10-CM

## 2017-09-04 ENCOUNTER — Other Ambulatory Visit: Payer: Self-pay | Admitting: *Deleted

## 2017-09-04 DIAGNOSIS — E039 Hypothyroidism, unspecified: Secondary | ICD-10-CM

## 2017-09-04 NOTE — Addendum Note (Signed)
Addended by: Eustaquio Maize on: 09/04/2017 04:15 PM   Modules accepted: Orders

## 2017-09-04 NOTE — Telephone Encounter (Signed)
I sent in refill of thyroid medicine. Can she come in for blood work to recheck thyroid level? It is due now. Lab appt. Order TSH future for me?

## 2017-10-19 ENCOUNTER — Other Ambulatory Visit: Payer: Self-pay | Admitting: Pediatrics

## 2017-10-19 DIAGNOSIS — I10 Essential (primary) hypertension: Secondary | ICD-10-CM

## 2017-11-19 ENCOUNTER — Other Ambulatory Visit: Payer: Self-pay | Admitting: Pediatrics

## 2017-11-19 DIAGNOSIS — I10 Essential (primary) hypertension: Secondary | ICD-10-CM

## 2017-11-20 NOTE — Telephone Encounter (Signed)
Aware she will need to be seen

## 2017-12-01 ENCOUNTER — Other Ambulatory Visit: Payer: Self-pay | Admitting: Pediatrics

## 2017-12-01 DIAGNOSIS — E039 Hypothyroidism, unspecified: Secondary | ICD-10-CM

## 2017-12-04 NOTE — Telephone Encounter (Signed)
OV 12/07/17

## 2017-12-06 DIAGNOSIS — H5203 Hypermetropia, bilateral: Secondary | ICD-10-CM | POA: Diagnosis not present

## 2017-12-06 DIAGNOSIS — H0261 Xanthelasma of right upper eyelid: Secondary | ICD-10-CM | POA: Diagnosis not present

## 2017-12-06 DIAGNOSIS — H52223 Regular astigmatism, bilateral: Secondary | ICD-10-CM | POA: Diagnosis not present

## 2017-12-06 DIAGNOSIS — H524 Presbyopia: Secondary | ICD-10-CM | POA: Diagnosis not present

## 2017-12-07 ENCOUNTER — Ambulatory Visit (INDEPENDENT_AMBULATORY_CARE_PROVIDER_SITE_OTHER): Payer: BLUE CROSS/BLUE SHIELD | Admitting: Pediatrics

## 2017-12-07 ENCOUNTER — Encounter: Payer: Self-pay | Admitting: Pediatrics

## 2017-12-07 VITALS — BP 137/85 | HR 87 | Temp 97.6°F | Ht 61.0 in | Wt 126.0 lb

## 2017-12-07 DIAGNOSIS — Z1211 Encounter for screening for malignant neoplasm of colon: Secondary | ICD-10-CM

## 2017-12-07 DIAGNOSIS — M5442 Lumbago with sciatica, left side: Secondary | ICD-10-CM

## 2017-12-07 DIAGNOSIS — E039 Hypothyroidism, unspecified: Secondary | ICD-10-CM

## 2017-12-07 DIAGNOSIS — M5441 Lumbago with sciatica, right side: Secondary | ICD-10-CM

## 2017-12-07 DIAGNOSIS — Z0000 Encounter for general adult medical examination without abnormal findings: Secondary | ICD-10-CM | POA: Diagnosis not present

## 2017-12-07 DIAGNOSIS — I10 Essential (primary) hypertension: Secondary | ICD-10-CM | POA: Diagnosis not present

## 2017-12-07 DIAGNOSIS — F419 Anxiety disorder, unspecified: Secondary | ICD-10-CM

## 2017-12-07 DIAGNOSIS — G8929 Other chronic pain: Secondary | ICD-10-CM

## 2017-12-07 DIAGNOSIS — K5900 Constipation, unspecified: Secondary | ICD-10-CM

## 2017-12-07 DIAGNOSIS — J449 Chronic obstructive pulmonary disease, unspecified: Secondary | ICD-10-CM

## 2017-12-07 MED ORDER — LISINOPRIL 10 MG PO TABS: ORAL_TABLET | ORAL | 2 refills | Status: DC

## 2017-12-07 MED ORDER — LEVOTHYROXINE SODIUM 25 MCG PO TABS: 25.0000 ug | ORAL_TABLET | Freq: Every day | ORAL | 3 refills | Status: DC

## 2017-12-07 MED ORDER — BUSPIRONE HCL 5 MG PO TABS: ORAL_TABLET | ORAL | 5 refills | Status: DC

## 2017-12-07 MED ORDER — TIOTROPIUM BROMIDE MONOHYDRATE 18 MCG IN CAPS: 18.0000 ug | ORAL_CAPSULE | Freq: Every day | RESPIRATORY_TRACT | 1 refills | Status: DC | PRN

## 2017-12-07 MED ORDER — NAPROXEN 500 MG PO TABS: 500.0000 mg | ORAL_TABLET | Freq: Two times a day (BID) | ORAL | 1 refills | Status: DC | PRN

## 2017-12-07 NOTE — Progress Notes (Signed)
  Subjective:   Patient ID: Amanda York, female    DOB: Oct 06, 1965, 52 y.o.   MRN: [id number] CC: Annual Exam  HPI: Amanda York is a 52 y.o. female   Anxiety bothering her more past few weeks. Taking lexapro and buspar regularly. Has had a couple of panic attacks. Nothing in particular bothering her. Mood has been fine. Panic comes out of the blue.  Tobacco use: ongoing, not interested in cessation  Constipation: harder time passing stools, doesn't feel l ike she is completely emptying, often has to strain  Relevant past medical, surgical, family and social history reviewed. Allergies and medications reviewed and updated. Social History   Tobacco Use  Smoking Status Current Every Day Smoker  . Packs/day: 1.00  . Types: Cigarettes  Smokeless Tobacco Never Used  Tobacco Comment   Since age 8   ROS: All systems negative other than what is in HPI  Objective:    BP 137/85   Pulse 87   Temp 97.6 F (36.4 C) (Oral)   Ht '5\' 1"'$  (1.549 m)   Wt 126 lb (57.2 kg)   BMI 23.81 kg/m   Wt Readings from Last 3 Encounters:  12/07/17 126 lb (57.2 kg)  08/03/17 126 lb (57.2 kg)  04/26/17 127 lb 3.2 oz (57.7 kg)    Gen: NAD, alert, cooperative with exam, NCAT EYES: EOMI, no conjunctival injection, or no icterus ENT:  TMs pearly gray b/l, OP without erythema LYMPH: no cervical LAD CV: NRRR, normal S1/S2, no murmur, distal pulses 2+ b/l Resp: CTABL, no wheezes, normal WOB Abd: +BS, soft, mildly tender with palpation throughout, ND. no guarding or organomegaly Ext: No edema, warm Neuro: Alert and oriented, strength equal b/l UE and LE, coordination grossly normal MSK: normal muscle bulk  Assessment & Plan:  Amanda York was seen today for annual exam.  Diagnoses and all orders for this visit:  Encounter for preventive health examination  Constipation, unspecified constipation type Discussed symptomatic care, sample of linzess given  Anxiety Declined counseling. Mood has been  fine. Will increase below. Cont lexapro. Gave app resources for anxiety. -     busPIRone (BUSPAR) 5 MG tablet; Take two tablets BID for 2 weeks, then can increase to 3 tablets BID.  Essential hypertension Stable, cont below -     lisinopril (PRINIVIL,ZESTRIL) 10 MG tablet; TAKE 1 TABLET(10 MG) BY MOUTH DAILY -     BMP8+EGFR  Chronic obstructive pulmonary disease, unspecified COPD type ([facility]) -     tiotropium (SPIRIVA) 18 MCG inhalation capsule; Place 1 capsule (18 mcg total) into inhaler and inhale daily as needed.  Chronic bilateral low back pain with bilateral sciatica -     naproxen (NAPROSYN) 500 MG tablet; Take 1 tablet (500 mg total) by mouth 2 (two) times daily as needed.  Hypothyroidism, unspecified type -     levothyroxine (SYNTHROID, LEVOTHROID) 25 MCG tablet; Take 1 tablet (25 mcg total) by mouth daily before breakfast. -     TSH  Colon cancer screening -     Fecal occult blood, imunochemical; Future     Follow up plan: No follow-ups on file. [name], MD [place]

## 2017-12-07 NOTE — Patient Instructions (Signed)

## 2017-12-08 LAB — BMP8+EGFR
BUN/Creatinine Ratio: 6 — ABNORMAL LOW (ref 9–23)
BUN: 4 mg/dL — AB (ref 6–24)
CHLORIDE: 99 mmol/L (ref 96–106)
CO2: 26 mmol/L (ref 20–29)
CREATININE: 0.66 mg/dL (ref 0.57–1.00)
Calcium: 9.5 mg/dL (ref 8.7–10.2)
GFR calc Af Amer: 117 mL/min/{1.73_m2} (ref >59)
GFR calc non Af Amer: 101 mL/min/{1.73_m2} (ref >59)
GLUCOSE: 88 mg/dL (ref 65–99)
Potassium: 4.4 mmol/L (ref 3.5–5.2)
SODIUM: 140 mmol/L (ref 134–144)

## 2017-12-08 LAB — TSH: TSH: 1.28 u[IU]/mL (ref 0.450–4.500)

## 2018-03-30 ENCOUNTER — Other Ambulatory Visit: Payer: Self-pay | Admitting: Pediatrics

## 2018-03-30 DIAGNOSIS — J309 Allergic rhinitis, unspecified: Secondary | ICD-10-CM

## 2018-05-04 DIAGNOSIS — B029 Zoster without complications: Secondary | ICD-10-CM | POA: Diagnosis not present

## 2018-05-04 DIAGNOSIS — Z6824 Body mass index (BMI) 24.0-24.9, adult: Secondary | ICD-10-CM | POA: Diagnosis not present

## 2018-05-09 ENCOUNTER — Other Ambulatory Visit: Payer: Self-pay | Admitting: Pediatrics

## 2018-05-09 DIAGNOSIS — R232 Flushing: Secondary | ICD-10-CM

## 2018-05-21 ENCOUNTER — Encounter: Payer: Self-pay | Admitting: Family Medicine

## 2018-05-21 ENCOUNTER — Ambulatory Visit: Payer: BLUE CROSS/BLUE SHIELD | Admitting: Family Medicine

## 2018-05-21 VITALS — BP 157/104 | HR 88 | Temp 97.5°F | Ht 61.0 in | Wt 122.0 lb

## 2018-05-21 DIAGNOSIS — J449 Chronic obstructive pulmonary disease, unspecified: Secondary | ICD-10-CM | POA: Diagnosis not present

## 2018-05-21 DIAGNOSIS — E039 Hypothyroidism, unspecified: Secondary | ICD-10-CM

## 2018-05-21 DIAGNOSIS — G8929 Other chronic pain: Secondary | ICD-10-CM

## 2018-05-21 DIAGNOSIS — B0229 Other postherpetic nervous system involvement: Secondary | ICD-10-CM | POA: Diagnosis not present

## 2018-05-21 DIAGNOSIS — Z23 Encounter for immunization: Secondary | ICD-10-CM

## 2018-05-21 DIAGNOSIS — F419 Anxiety disorder, unspecified: Secondary | ICD-10-CM

## 2018-05-21 DIAGNOSIS — I10 Essential (primary) hypertension: Secondary | ICD-10-CM | POA: Diagnosis not present

## 2018-05-21 DIAGNOSIS — M5442 Lumbago with sciatica, left side: Secondary | ICD-10-CM

## 2018-05-21 DIAGNOSIS — M5441 Lumbago with sciatica, right side: Secondary | ICD-10-CM

## 2018-05-21 MED ORDER — ALBUTEROL SULFATE HFA 108 (90 BASE) MCG/ACT IN AERS: 2.0000 | INHALATION_SPRAY | Freq: Four times a day (QID) | RESPIRATORY_TRACT | 3 refills | Status: DC | PRN

## 2018-05-21 MED ORDER — NAPROXEN 500 MG PO TABS: 500.0000 mg | ORAL_TABLET | Freq: Two times a day (BID) | ORAL | 1 refills | Status: DC | PRN

## 2018-05-21 MED ORDER — BUSPIRONE HCL 10 MG PO TABS: 10.0000 mg | ORAL_TABLET | Freq: Three times a day (TID) | ORAL | 1 refills | Status: DC

## 2018-05-21 MED ORDER — GABAPENTIN 100 MG PO CAPS: ORAL_CAPSULE | ORAL | 0 refills | Status: DC

## 2018-05-21 NOTE — Patient Instructions (Signed)
I have sent in your naproxen.  Be aware that this combined with Lexapro can increase your risk for GI bleed.  Take the naproxen separately and sparingly and only if needed.  I have sent in gabapentin.  This will help with the pain that you are having from the shingles rash.  Take 1 capsule every night at bedtime for 1 week.  You may increase to 2 capsules every night at bedtime if needed after 1 week.  After additional week, you can increase to 3 capsules at bedtime.  See me back in 2 weeks for recheck.   Postherpetic Neuralgia Postherpetic neuralgia (PHN) is nerve pain that occurs after a shingles infection. Shingles is a painful rash that appears on one area of the body, usually on the trunk or face. Shingles is caused by the varicella-zoster virus. This is the same virus that causes chickenpox. In people who have had chickenpox, the virus can resurface years later and cause shingles. You may have PHN if you continue to have pain for 4 months after your shingles rash has gone away. PHN appears in the same area where you had the shingles rash. The pain usually goes away after the rash disappears. Getting a vaccination for shingles can prevent PHN. This vaccine is recommended for people older than 60. It may prevent shingles, and may also lower your risk of PHN if you do get shingles. What are the causes? This condition is caused by damage to your nerves from the varicella-zoster virus. The damage makes your nerves overly sensitive. What increases the risk? The following factors may make you more likely to develop this condition:  Being older than 54 years of age.  Having severe pain before your shingles rash starts.  Having a severe rash.  Having shingles in and around the eye area.  Having a disease that makes your body unable to fight infections (weak immune system). What are the signs or symptoms? The main symptom of this condition is pain. The pain may:  Often be very bad and may be  described as stabbing, burning, or feeling like an electric shock.  Come and go or may be there all the time.  Be triggered by light touches on the skin or changes in temperature. You may have itching along with the pain. How is this diagnosed? This condition may be diagnosed based on your symptoms and your history of shingles. Lab studies and other diagnostic tests are usually not needed. How is this treated? There is no cure for this condition. Treatment for PHN will focus on pain relief. Over-the-counter pain relievers do not usually relieve PHN pain. You may need to work with a pain specialist. Treatment may include:  Antidepressant medicines to help with pain and improve sleep.  Anti-seizure medicines to relieve nerve pain.  Strong pain relievers (opioids).  A numbing patch worn on the skin (lidocaine patch).  Botox (botulinum toxin) injections to block pain signals between nerves and muscles.  Injections of numbing medicine or anti-inflammatory medicines around irritated nerves. Follow these instructions at home:   It may take a long time to recover from PHN. Work closely with your health care provider and develop a good support system at home.  Take over-the-counter and prescription medicines only as told by your health care provider.  Do not drive or use heavy machinery while taking prescription pain medicine.  Wear loose, comfortable clothing.  Cover sensitive areas with a dressing to reduce friction from clothing rubbing on the area.  If directed, put ice on the painful area: ? Put ice in a plastic bag. ? Place a towel between your skin and the bag. ? Leave the ice on for 20 minutes, 2-3 times a day.  Talk to your health care provider if you feel depressed or desperate. Living with long-term pain can be depressing.  Keep all follow-up visits as told by your health care provider. This is important. Contact a health care provider if:  Your medicine is not  helping.  You are struggling to manage your pain at home. Summary  Postherpetic neuralgia is a very painful disorder that can occur after an episode of shingles.  The pain is often severe, burning, electric, or stabbing.  Prescription medicines can be helpful in managing persistent pain.  Getting a vaccination for shingles can prevent PHN. This vaccine is recommended for people older than 60. This information is not intended to replace advice given to you by your health care provider. Make sure you discuss any questions you have with your health care provider. Document Released: 06/10/2002 Document Revised: 06/06/2016 Document Reviewed: 06/06/2016 Elsevier Interactive Patient Education  2019 [employer].

## 2018-05-21 NOTE — Progress Notes (Signed)
Subjective: CC: Shingles PCP: Janora Norlander, DO RCB:ULAGTX Amanda York is a 53 y.o. female presenting to clinic today for:  1.  Postherpetic neuralgia Patient reports diagnosis with shingles rash on her right lower extremity on 06/02/2026 in urgent care.  She was treated with an antiviral and has completed that now.  She notes that she continues to have a burning sensation particularly along the right inner thigh and medial right knee.  The rash seems to have dried up but is still tender in that area.  She has used naproxen with little improvement in symptoms.  She does feel a numb and tingly sensation over the area.  2.  Hypothyroidism Patient reports longstanding history of hypothyroidism.  She was found to have a thyroid nodule on the left lobe per her report many years ago and had a subsequent biopsy.  Biopsy was benign.  She has since been on thyroid replacement and reports compliance with this medicine.  Denies any constipation, diarrhea, tremor, difficulty swallowing or change in voice.  No changes in vision.  3.  Hypertension Patient reports compliance with lisinopril 10 mg daily.  No chest pain, shortness of breath.  She feels that the blood pressure is elevated because she is in pain today.  Typically it is within normal limits at home.  4.  Tobacco use disorder Patient reports ongoing tobacco use with less than 1 pack/day smoked for about 30 years.  Denies any hemoptysis, shortness of breath.  She does have history of COPD and reports compliance with Spiriva and intermittent use of albuterol.  5.  Depressive disorder/anxiety disorder Patient reports compliance with Lexapro and buspirone.  She notes that she takes 10 mg of buspirone 3 times daily and this seems to control her symptoms very well.  No issues with GI bleed or irritation when she takes the naproxen but she does report that she uses the Naproxen very sparingly.  ROS: Per HPI  No Known Allergies Past Medical History:    Diagnosis Date  . COPD (chronic obstructive pulmonary disease) ([place])   . Hypertension   . Hypothyroidism   . Tobacco abuse     Current Outpatient Medications:  .  albuterol (PROVENTIL HFA;VENTOLIN HFA) 108 (90 Base) MCG/ACT inhaler, Inhale 2 puffs into the lungs every 6 (six) hours as needed for wheezing or shortness of breath., Disp: 1 Inhaler, Rfl: 3 .  aspirin EC 81 MG EC tablet, Take 1 tablet (81 mg total) by mouth daily., Disp: 30 tablet, Rfl: 0 .  busPIRone (BUSPAR) 5 MG tablet, Take two tablets BID for 2 weeks, then can increase to 3 tablets BID., Disp: 180 tablet, Rfl: 5 .  cetirizine (ZYRTEC) 10 MG tablet, TAKE 1 TABLET BY MOUTH ONCE DAILY, Disp: 30 tablet, Rfl: 2 .  escitalopram (LEXAPRO) 20 MG tablet, TAKE 1 TABLET(20 MG) BY MOUTH DAILY, Disp: 90 tablet, Rfl: 0 .  fluticasone (FLONASE) 50 MCG/ACT nasal spray, Place 2 sprays into both nostrils daily., Disp: 16 g, Rfl: 6 .  levothyroxine (SYNTHROID, LEVOTHROID) 25 MCG tablet, Take 1 tablet (25 mcg total) by mouth daily before breakfast., Disp: 90 tablet, Rfl: 3 .  lisinopril (PRINIVIL,ZESTRIL) 10 MG tablet, TAKE 1 TABLET(10 MG) BY MOUTH DAILY, Disp: 90 tablet, Rfl: 2 .  naproxen (NAPROSYN) 500 MG tablet, Take 1 tablet (500 mg total) by mouth 2 (two) times daily as needed., Disp: 20 tablet, Rfl: 1 .  tiotropium (SPIRIVA) 18 MCG inhalation capsule, Place 1 capsule (18 mcg total) into inhaler and  inhale daily as needed., Disp: 90 capsule, Rfl: 1 Social History   Socioeconomic History  . Marital status: Married    Spouse name: Not on file  . Number of children: Not on file  . Years of education: Not on file  . Highest education level: Not on file  Occupational History  . Not on file  Social Needs  . Financial resource strain: Not on file  . Food insecurity:    Worry: Not on file    Inability: Not on file  . Transportation needs:    Medical: Not on file    Non-medical: Not on file  Tobacco Use  . Smoking status: Current  Every Day Smoker    Packs/day: 1.00    Types: Cigarettes  . Smokeless tobacco: Never Used  . Tobacco comment: Since age 65  Substance and Sexual Activity  . Alcohol use: No    Alcohol/week: 0.0 standard drinks  . Drug use: No  . Sexual activity: Yes  Lifestyle  . Physical activity:    Days per week: Not on file    Minutes per session: Not on file  . Stress: Not on file  Relationships  . Social connections:    Talks on phone: Not on file    Gets together: Not on file    Attends religious service: Not on file    Active member of club or organization: Not on file    Attends meetings of clubs or organizations: Not on file    Relationship status: Not on file  . Intimate partner violence:    Fear of current or ex partner: Not on file    Emotionally abused: Not on file    Physically abused: Not on file    Forced sexual activity: Not on file  Other Topics Concern  . Not on file  Social History Narrative  . Not on file   Family History  Problem Relation Age of Onset  . CAD Father        Died at 23 of MI  . CAD Paternal Uncle        Died at 20 of MI  . CAD Paternal Grandfather        Details unclaer    Objective: Office vital signs reviewed. BP (!) 158/101   Pulse 88   Temp (!) 97.5 F (36.4 C) (Oral)   Ht 5\' 1"  (1.549 m)   Wt 122 lb (55.3 kg)   BMI 23.05 kg/m   Physical Examination:  General: Awake, alert, nontoxic appearing, No acute distress HEENT: Normal    Neck: No masses palpated. No lymphadenopathy; no goiter    Eyes: PERRLA, extraocular membranes intact, sclera white.  No exophthalmos Cardio: regular rate and rhythm, S1S2 heard, no murmurs appreciated Pulm: clear to auscultation bilaterally, no wheezes, rhonchi or rales; normal work of breathing on room air Extremities: warm, well perfused, No edema, cyanosis or clubbing; +2 pulses bilaterally MSK: normal gait and station Skin: She has several crusted over lesions noted along the right medial thigh and  would almost appears to be a maculopapular rash along the medial knee on the right.  There is no active oozing or evidence of secondary bacterial infection. Neuro: No tremor  Psych: Mood stable, speech normal, affect appropriate, pleasant interactive. Depression screen [facility] 2/9 05/21/2018 12/07/2017 08/03/2017  Decreased Interest 0 0 0  Down, Depressed, Hopeless 0 0 0  PHQ - 2 Score 0 0 0  Altered sleeping 0 - -  Tired, decreased energy 0 - -  Change in appetite 0 - -  Feeling bad or failure about yourself  0 - -  Trouble concentrating 0 - -  Moving slowly or fidgety/restless 0 - -  Suicidal thoughts 0 - -  PHQ-9 Score 0 - -  Difficult doing work/chores - - -   GAD 7 : Generalized Anxiety Score 05/21/2018 12/07/2017 01/22/2017 09/15/2016  Nervous, Anxious, on Edge 1 3 3 3   Control/stop worrying 1 3 3  0  Worry too much - different things 1 3 3 3   Trouble relaxing 1 3 3 3   Restless 1 3 3 3   Easily annoyed or irritable 1 3 3 3   Afraid - awful might happen 1 3 3 3   Total GAD 7 Score 7 21 21 18   Anxiety Difficulty Somewhat difficult Somewhat difficult Somewhat difficult -    Assessment/ Plan: 53 y.o. female   1. Hypothyroidism, unspecified type Check thyroid panel. - Thyroid Panel With TSH  2. Essential hypertension Not controlled but may be related to ongoing postherpetic neuralgia.  She will return in 2 weeks for recheck.  Counseled on smoking cessation as well today  3. Post herpetic neuralgia Start gabapentin.  Start at 100 mg nightly for 1 week.  May increase to 200 mg nightly if needed.  Titration instructions were reviewed.  She will follow-up in 2 weeks for recheck.  Caution sedation.  4. Chronic obstructive pulmonary disease, unspecified COPD type ([place]) Seemingly controlled with rare use of albuterol.  Albuterol inhalers have been refilled - albuterol (PROVENTIL HFA;VENTOLIN HFA) 108 (90 Base) MCG/ACT inhaler; Inhale 2 puffs into the lungs every 6 (six) hours as needed for  wheezing or shortness of breath.  Dispense: 1 Inhaler; Refill: 3  5. Anxiety Has improved quite a bit on addition of BuSpar.  I have sent in 6 months worth of refills.  We will continue to monitor this - busPIRone (BUSPAR) 10 MG tablet; Take 1 tablet (10 mg total) by mouth 3 (three) times daily.  Dispense: 270 tablet; Refill: 1  6. Chronic bilateral low back pain with bilateral sciatica Advised to use sparingly and with caution especially with Lexapro.  She voiced good understanding of signs and symptoms of GI bleed and will follow-up PRN. - naproxen (NAPROSYN) 500 MG tablet; Take 1 tablet (500 mg total) by mouth 2 (two) times daily as needed.  Dispense: 20 tablet; Refill: 1   No orders of the defined types were placed in this encounter.  Meds ordered this encounter  Medications  . gabapentin (NEURONTIN) 100 MG capsule    Sig: Take 1 capsule (100 mg total) by mouth at bedtime for 7 days, THEN 2 capsules (200 mg total) at bedtime for 7 days, THEN 3 capsules (300 mg total) at bedtime for 14 days.    Dispense:  63 capsule    Refill:  0  . albuterol (PROVENTIL HFA;VENTOLIN HFA) 108 (90 Base) MCG/ACT inhaler    Sig: Inhale 2 puffs into the lungs every 6 (six) hours as needed for wheezing or shortness of breath.    Dispense:  1 Inhaler    Refill:  3  . busPIRone (BUSPAR) 10 MG tablet    Sig: Take 1 tablet (10 mg total) by mouth 3 (three) times daily.    Dispense:  270 tablet    Refill:  1  . naproxen (NAPROSYN) 500 MG tablet    Sig: Take 1 tablet (500 mg total) by mouth 2 (two) times daily as needed.    Dispense:  20  tablet    Refill:  [place], DO [place] [phone]

## 2018-06-04 ENCOUNTER — Encounter: Payer: Self-pay | Admitting: Family Medicine

## 2018-06-04 ENCOUNTER — Ambulatory Visit: Payer: BLUE CROSS/BLUE SHIELD | Admitting: Family Medicine

## 2018-06-04 VITALS — BP 152/101 | HR 96 | Temp 98.1°F | Ht 61.0 in | Wt 121.0 lb

## 2018-06-04 DIAGNOSIS — R0602 Shortness of breath: Secondary | ICD-10-CM

## 2018-06-04 DIAGNOSIS — I10 Essential (primary) hypertension: Secondary | ICD-10-CM

## 2018-06-04 DIAGNOSIS — B0229 Other postherpetic nervous system involvement: Secondary | ICD-10-CM

## 2018-06-04 DIAGNOSIS — J9801 Acute bronchospasm: Secondary | ICD-10-CM

## 2018-06-04 DIAGNOSIS — F411 Generalized anxiety disorder: Secondary | ICD-10-CM | POA: Diagnosis not present

## 2018-06-04 DIAGNOSIS — Z720 Tobacco use: Secondary | ICD-10-CM

## 2018-06-04 MED ORDER — BUDESONIDE-FORMOTEROL FUMARATE 160-4.5 MCG/ACT IN AERO: 2.0000 | INHALATION_SPRAY | Freq: Two times a day (BID) | RESPIRATORY_TRACT | 1 refills | Status: DC

## 2018-06-04 NOTE — Patient Instructions (Signed)
I am glad to hear that the rash is gone and you are no longer having pain.  For your anxiety:  Increase Buspar to 1.5 tablets (15mg ) in the morning.  Continue 1 tablet (10mg ) in the afternoon and evening.  If you are still having significant symptoms after 1 week: Take 1.5 tablets each morning, 1.5 tablets every afternoon and 1 tablet every evening  If you still have significant anxiety after 1 week: Take 1.5 tablets 3 times daily.   For your blood pressure:  Check your blood pressure at the same time every day at least 1 hour after taking the Lisinopril.    Make sure to follow the instructions below on how to take a blood pressure correctly (you should be seated with both feet on the grown, relaxed for 10 minutes (no talking/ moving), and have arm at heart level while taking.  Record and bring to your next appointment along with your Blood pressure monitor  See me in 4-6 weeks for recheck   How to Take Your Blood Pressure You can take your blood pressure at home with a machine. You may need to check your blood pressure at home:  To check if you have high blood pressure (hypertension).  To check your blood pressure over time.  To make sure your blood pressure medicine is working. Supplies needed: You will need a blood pressure machine, or monitor. You can buy one at a drugstore or online. When choosing one:  Choose one with an arm cuff.  Choose one that wraps around your upper arm. Only one finger should fit between your arm and the cuff.  Do not choose one that measures your blood pressure from your wrist or finger. Your doctor can suggest a monitor. How to prepare Avoid these things for 30 minutes before checking your blood pressure:  Drinking caffeine.  Drinking alcohol.  Eating.  Smoking.  Exercising. Five minutes before checking your blood pressure:  Pee.  Sit in a dining chair. Avoid sitting in a soft couch or armchair.  Be quiet. Do not talk. How to  take your blood pressure Follow the instructions that came with your machine. If you have a digital blood pressure monitor, these may be the instructions: 1. Sit up straight. 2. Place your feet on the floor. Do not cross your ankles or legs. 3. Rest your left arm at the level of your heart. You may rest it on a table, desk, or chair. 4. Pull up your shirt sleeve. 5. Wrap the blood pressure cuff around the upper part of your left arm. The cuff should be 1 inch (2.5 cm) above your elbow. It is best to wrap the cuff around bare skin. 6. Fit the cuff snugly around your arm. You should be able to place only one finger between the cuff and your arm. 7. Put the cord inside the groove of your elbow. 8. Press the power button. 9. Sit quietly while the cuff fills with air and loses air. 10. Write down the numbers on the screen. 11. Wait 2-3 minutes and then repeat steps 1-10. What do the numbers mean? Two numbers make up your blood pressure. The first number is called systolic pressure. The second is called diastolic pressure. An example of a blood pressure reading is "120 over 80" (or 120/80). If you are an adult and do not have a medical condition, use this guide to find out if your blood pressure is normal: Normal  First number: below 120.  Second  number: below 80. Elevated  First number: 120-129.  Second number: below 80. Hypertension stage 1  First number: 130-139.  Second number: 80-89. Hypertension stage 2  First number: 140 or above.  Second number: 90 or above. Your blood pressure is above normal even if only the top or bottom number is above normal. Follow these instructions at home:  Check your blood pressure as often as your doctor tells you to.  Take your monitor to your next doctor's appointment. Your doctor will: ? Make sure you are using it correctly. ? Make sure it is working right.  Make sure you understand what your blood pressure numbers should be.  Tell your  doctor if your medicines are causing side effects. Contact a doctor if:  Your blood pressure keeps being high. Get help right away if:  Your first blood pressure number is higher than 180.  Your second blood pressure number is higher than 120. This information is not intended to replace advice given to you by your health care provider. Make sure you discuss any questions you have with your health care provider. Document Released: 03/02/2008 Document Revised: 02/16/2016 Document Reviewed: 08/27/2015 Elsevier Interactive Patient Education  2019 [employer].

## 2018-06-04 NOTE — Progress Notes (Signed)
Subjective: CC: post herpetic neuralgia PCP: Janora Norlander, DO UXN:ATFTDD Amanda York is a 53 y.o. female presenting to clinic today for:  1.  Postherpetic neuralgia Patient was prescribed gabapentin at last visit and notes total resolution of rash and pain associated with a rash.  2.  Hypertension/?COPD Patient reports ongoing hypertension that fluctuates at home.  She reports blood pressures anywhere from 96-156/62-102.  Sometimes she has chest tightness but she associates this with anxiety.  She has been having intermittent shortness of breath that is relieved by albuterol, which she uses every day.  She also uses Spiriva daily.  She continues to be an every day smoker.  No hemoptysis.  No night sweats or unplanned weight loss.  No nausea, vomiting, dizziness or lower extremity edema.  She has never seen a pulmonologist but would like to see one.  3. Anxiety Patient reports increased anxiety.  No inciting event.  No SI, HI.  She reports difficulty falling asleep some nights because of racing thoughts.  She reports compliance with Lexapro and buspirone.  ROS: Per HPI  No Known Allergies Past Medical History:  Diagnosis Date  . COPD (chronic obstructive pulmonary disease) ([place])   . Hypertension   . Hypothyroidism   . Tobacco abuse     Current Outpatient Medications:  .  albuterol (PROVENTIL HFA;VENTOLIN HFA) 108 (90 Base) MCG/ACT inhaler, Inhale 2 puffs into the lungs every 6 (six) hours as needed for wheezing or shortness of breath., Disp: 1 Inhaler, Rfl: 3 .  aspirin EC 81 MG EC tablet, Take 1 tablet (81 mg total) by mouth daily., Disp: 30 tablet, Rfl: 0 .  busPIRone (BUSPAR) 10 MG tablet, Take 1 tablet (10 mg total) by mouth 3 (three) times daily., Disp: 270 tablet, Rfl: 1 .  cetirizine (ZYRTEC) 10 MG tablet, TAKE 1 TABLET BY MOUTH ONCE DAILY, Disp: 30 tablet, Rfl: 2 .  escitalopram (LEXAPRO) 20 MG tablet, TAKE 1 TABLET(20 MG) BY MOUTH DAILY, Disp: 90 tablet, Rfl: 0 .   fluticasone (FLONASE) 50 MCG/ACT nasal spray, Place 2 sprays into both nostrils daily., Disp: 16 g, Rfl: 6 .  levothyroxine (SYNTHROID, LEVOTHROID) 25 MCG tablet, Take 1 tablet (25 mcg total) by mouth daily before breakfast., Disp: 90 tablet, Rfl: 3 .  lisinopril (PRINIVIL,ZESTRIL) 10 MG tablet, TAKE 1 TABLET(10 MG) BY MOUTH DAILY, Disp: 90 tablet, Rfl: 2 .  naproxen (NAPROSYN) 500 MG tablet, Take 1 tablet (500 mg total) by mouth 2 (two) times daily as needed., Disp: 20 tablet, Rfl: 1 .  tiotropium (SPIRIVA) 18 MCG inhalation capsule, Place 1 capsule (18 mcg total) into inhaler and inhale daily as needed., Disp: 90 capsule, Rfl: 1 .  gabapentin (NEURONTIN) 100 MG capsule, Take 1 capsule (100 mg total) by mouth at bedtime for 7 days, THEN 2 capsules (200 mg total) at bedtime for 7 days, THEN 3 capsules (300 mg total) at bedtime for 14 days. (Patient not taking: Reported on 06/04/2018), Disp: 63 capsule, Rfl: 0 Social History   Socioeconomic History  . Marital status: Married    Spouse name: Not on file  . Number of children: Not on file  . Years of education: Not on file  . Highest education level: Not on file  Occupational History  . Not on file  Social Needs  . Financial resource strain: Not on file  . Food insecurity:    Worry: Not on file    Inability: Not on file  . Transportation needs:    Medical:  Not on file    Non-medical: Not on file  Tobacco Use  . Smoking status: Current Every Day Smoker    Packs/day: 1.00    Types: Cigarettes  . Smokeless tobacco: Never Used  . Tobacco comment: Since age 79  Substance and Sexual Activity  . Alcohol use: No    Alcohol/week: 0.0 standard drinks  . Drug use: No  . Sexual activity: Yes  Lifestyle  . Physical activity:    Days per week: Not on file    Minutes per session: Not on file  . Stress: Not on file  Relationships  . Social connections:    Talks on phone: Not on file    Gets together: Not on file    Attends religious service:  Not on file    Active member of club or organization: Not on file    Attends meetings of clubs or organizations: Not on file    Relationship status: Not on file  . Intimate partner violence:    Fear of current or ex partner: Not on file    Emotionally abused: Not on file    Physically abused: Not on file    Forced sexual activity: Not on file  Other Topics Concern  . Not on file  Social History Narrative  . Not on file   Family History  Problem Relation Age of Onset  . CAD Father        Died at 44 of MI  . CAD Paternal Uncle        Died at 58 of [place]  . CAD Paternal Grandfather        Details unclaer    Objective: Office vital signs reviewed. BP (!) 152/101   Pulse 96   Temp 98.1 F (36.7 C) (Oral)   Ht 5\' 1"  (1.549 m)   Wt 121 lb (54.9 kg)   BMI 22.86 kg/m   Physical Examination:  General: Awake, alert, nontoxic appearing, No acute distress HEENT: Normal    Neck: No masses palpated. No lymphadenopathy; no goiter    Eyes: PERRLA, extraocular membranes intact, sclera white.  No exophthalmos Cardio: regular rate and rhythm, S1S2 heard, no murmurs appreciated Pulm: clear to auscultation bilaterally, no wheezes, rhonchi or rales; normal work of breathing on room air Extremities: warm, well perfused, No edema, cyanosis or clubbing; +2 pulses bilaterally Psych: Mood stable, speech normal, affect appropriate, pleasant interactive. Depression screen [facility] 2/9 06/04/2018 05/21/2018 12/07/2017  Decreased Interest 2 0 0  Down, Depressed, Hopeless 2 0 0  PHQ - 2 Score 4 0 0  Altered sleeping 1 0 -  Tired, decreased energy 1 0 -  Change in appetite 1 0 -  Feeling bad or failure about yourself  0 0 -  Trouble concentrating 0 0 -  Moving slowly or fidgety/restless 1 0 -  Suicidal thoughts 0 0 -  PHQ-9 Score 8 0 -  Difficult doing work/chores - - -   GAD 7 : Generalized Anxiety Score 06/04/2018 05/21/2018 12/07/2017 01/22/2017  Nervous, Anxious, on Edge 2 1 3 3   Control/stop worrying 2  1 3 3   Worry too much - different things 2 1 3 3   Trouble relaxing 2 1 3 3   Restless 2 1 3 3   Easily annoyed or irritable 2 1 3 3   Afraid - awful might happen 2 1 3 3   Total GAD 7 Score 14 7 21 21   Anxiety Difficulty - Somewhat difficult Somewhat difficult Somewhat difficult    Assessment/ Plan:  53 y.o. female   1. Essential hypertension Not controlled.  Seems to be fluctuating quite a bit at home.  Given risk for hypertension with home blood pressures of 96/62, I have refrained from changing her lisinopril dose since she was previously controlled.  We will work on reducing anxiety with BuSpar increased below.  She will monitor blood pressures very closely at home and record.  She will call me in the next 2 weeks and if blood pressures remain above 140s over 90s, we will increase lisinopril dose prior to her next visit.  2. Generalized anxiety disorder Increase by 5mg  every week for target dose of 15 TID.  We discussed titration of this based on symptoms.  She will follow-up in 4 to 6 weeks.  Continue Lexapro.  3. Post herpetic neuralgia Resolved  4. Bronchospasm History of diagnosis of COPD but has never had formal PFTs or evaluation.  She is having symptoms through Spiriva and albuterol.  For this reason I have added Symbicort.  Instructions for use discussed with the patient.  Coupon provided.  Referral to pulmonology placed.  I advised her to hold inhalers prior to pulmonology appointment should they decide to proceed with PFTs. - budesonide-formoterol (SYMBICORT) 160-4.5 MCG/ACT inhaler; Inhale 2 puffs into the lungs 2 (two) times daily.  Dispense: 1 Inhaler; Refill: 1 - Ambulatory referral to Pulmonology  5. Shortness of breath As above.  Lung exam without wheezes.  Plan for chest x-ray if symptoms persist.  Currently, she is afebrile and well-appearing. - budesonide-formoterol (SYMBICORT) 160-4.5 MCG/ACT inhaler; Inhale 2 puffs into the lungs 2 (two) times daily.  Dispense: 1  Inhaler; Refill: 1 - Ambulatory referral to Pulmonology  6. Tobacco use Cessation recommended. - Ambulatory referral to Pulmonology   Orders Placed This Encounter  Procedures  . Ambulatory referral to Pulmonology    Referral Priority:   Routine    Referral Type:   Consultation    Referral Reason:   Specialty Services Required    Requested Specialty:   Pulmonary Disease    Number of Visits Requested:   1   Meds ordered this encounter  Medications  . budesonide-formoterol (SYMBICORT) 160-4.5 MCG/ACT inhaler    Sig: Inhale 2 puffs into the lungs 2 (two) times daily.    Dispense:  1 Inhaler    Refill:  [place], [place] [phone]

## 2018-06-18 ENCOUNTER — Ambulatory Visit (INDEPENDENT_AMBULATORY_CARE_PROVIDER_SITE_OTHER): Payer: BLUE CROSS/BLUE SHIELD

## 2018-06-18 ENCOUNTER — Ambulatory Visit: Payer: BLUE CROSS/BLUE SHIELD | Admitting: Family Medicine

## 2018-06-18 ENCOUNTER — Other Ambulatory Visit: Payer: Self-pay

## 2018-06-18 ENCOUNTER — Encounter: Payer: Self-pay | Admitting: Family Medicine

## 2018-06-18 VITALS — BP 130/92 | HR 96 | Temp 97.0°F | Ht 61.0 in | Wt 117.4 lb

## 2018-06-18 DIAGNOSIS — J441 Chronic obstructive pulmonary disease with (acute) exacerbation: Secondary | ICD-10-CM

## 2018-06-18 DIAGNOSIS — R0602 Shortness of breath: Secondary | ICD-10-CM | POA: Diagnosis not present

## 2018-06-18 DIAGNOSIS — R05 Cough: Secondary | ICD-10-CM | POA: Diagnosis not present

## 2018-06-18 MED ORDER — PREDNISONE 20 MG PO TABS: 40.0000 mg | ORAL_TABLET | Freq: Every day | ORAL | 0 refills | Status: AC

## 2018-06-18 MED ORDER — DOXYCYCLINE HYCLATE 100 MG PO TABS: 100.0000 mg | ORAL_TABLET | Freq: Two times a day (BID) | ORAL | 0 refills | Status: AC

## 2018-06-18 MED ORDER — METHYLPREDNISOLONE ACETATE 80 MG/ML IJ SUSP
80.0000 mg | Freq: Once | INTRAMUSCULAR | Status: AC
  Administered 2018-06-18: 80 mg via INTRAMUSCULAR

## 2018-06-18 NOTE — Progress Notes (Signed)
Subjective: CC: SOB PCP: Janora Norlander, DO Amanda York is a 53 y.o. female presenting to clinic today for:  1. Shortness of breath Patient here with reports of a 5-day history of shortness of breath.  She reports associated wheezes and brown sputum.  Denies any hemoptysis, fevers.  She has been using her inhalers as directed but finds that these are not helping.  She has history of pneumococcal infection in the past.  She has medical history significant for COPD.  She has an appointment with pulmonology 1 April.   ROS: Per HPI  No Known Allergies Past Medical History:  Diagnosis Date   COPD (chronic obstructive pulmonary disease) ([facility])    Hypertension    Hypothyroidism    Tobacco abuse     Current Outpatient Medications:    albuterol (PROVENTIL HFA;VENTOLIN HFA) 108 (90 Base) MCG/ACT inhaler, Inhale 2 puffs into the lungs every 6 (six) hours as needed for wheezing or shortness of breath., Disp: 1 Inhaler, Rfl: 3   aspirin EC 81 MG EC tablet, Take 1 tablet (81 mg total) by mouth daily., Disp: 30 tablet, Rfl: 0   budesonide-formoterol (SYMBICORT) 160-4.5 MCG/ACT inhaler, Inhale 2 puffs into the lungs 2 (two) times daily., Disp: 1 Inhaler, Rfl: 1   busPIRone (BUSPAR) 10 MG tablet, Take 1 tablet (10 mg total) by mouth 3 (three) times daily., Disp: 270 tablet, Rfl: 1   cetirizine (ZYRTEC) 10 MG tablet, TAKE 1 TABLET BY MOUTH ONCE DAILY, Disp: 30 tablet, Rfl: 2   escitalopram (LEXAPRO) 20 MG tablet, TAKE 1 TABLET(20 MG) BY MOUTH DAILY, Disp: 90 tablet, Rfl: 0   fluticasone (FLONASE) 50 MCG/ACT nasal spray, Place 2 sprays into both nostrils daily., Disp: 16 g, Rfl: 6   gabapentin (NEURONTIN) 100 MG capsule, Take 1 capsule (100 mg total) by mouth at bedtime for 7 days, THEN 2 capsules (200 mg total) at bedtime for 7 days, THEN 3 capsules (300 mg total) at bedtime for 14 days., Disp: 63 capsule, Rfl: 0   levothyroxine (SYNTHROID, LEVOTHROID) 25 MCG tablet, Take 1  tablet (25 mcg total) by mouth daily before breakfast., Disp: 90 tablet, Rfl: 3   lisinopril (PRINIVIL,ZESTRIL) 10 MG tablet, TAKE 1 TABLET(10 MG) BY MOUTH DAILY, Disp: 90 tablet, Rfl: 2   naproxen (NAPROSYN) 500 MG tablet, Take 1 tablet (500 mg total) by mouth 2 (two) times daily as needed., Disp: 20 tablet, Rfl: 1   tiotropium (SPIRIVA) 18 MCG inhalation capsule, Place 1 capsule (18 mcg total) into inhaler and inhale daily as needed., Disp: 90 capsule, Rfl: 1 Social History   Socioeconomic History   Marital status: Married    Spouse name: Not on file   Number of children: Not on file   Years of education: Not on file   Highest education level: Not on file  Occupational History   Not on file  Social Needs   Financial resource strain: Not on file   Food insecurity:    Worry: Not on file    Inability: Not on file   Transportation needs:    Medical: Not on file    Non-medical: Not on file  Tobacco Use   Smoking status: Current Every Day Smoker    Packs/day: 1.00    Types: Cigarettes   Smokeless tobacco: Never Used   Tobacco comment: Since age 78  Substance and Sexual Activity   Alcohol use: No    Alcohol/week: 0.0 standard drinks   Drug use: No   Sexual activity:  Yes  Lifestyle   Physical activity:    Days per week: Not on file    Minutes per session: Not on file   Stress: Not on file  Relationships   Social connections:    Talks on phone: Not on file    Gets together: Not on file    Attends religious service: Not on file    Active member of club or organization: Not on file    Attends meetings of clubs or organizations: Not on file    Relationship status: Not on file   Intimate partner violence:    Fear of current or ex partner: Not on file    Emotionally abused: Not on file    Physically abused: Not on file    Forced sexual activity: Not on file  Other Topics Concern   Not on file  Social History Narrative   Not on file   Family History    Problem Relation Age of Onset   CAD Father        Died at 18 of [place]   CAD Paternal Uncle        Died at 19 of [place]   CAD Paternal Grandfather        Details unclaer    Objective: Office vital signs reviewed. BP (!) 130/92    Pulse 96    Temp (!) 97 F (36.1 C) (Oral)    Ht 5\' 1"  (1.549 m)    Wt 117 lb 6.4 oz (53.3 kg)    SpO2 97%    BMI 22.18 kg/m   Physical Examination:  General: Awake, alert, well nourished, nontoxic. No acute distress Cardio: RRR, no murmurs Pulm: Globally decreased breath sounds with predominantly decreased breath sounds in the left lower lung fields.  She has mild expiratory wheeze noted.  No rhonchi or rales.  Normal work of breathing on room air.  Dg Chest 2 View  Result Date: 06/18/2018 CLINICAL DATA:  Cough and shortness of breath.  History of smoking. EXAM: CHEST - 2 VIEW COMPARISON:  Chest x-ray 08/15/2016 FINDINGS: The cardiac silhouette, mediastinal and hilar contours are normal and stable. Stable emphysematous changes with hyperinflation. No infiltrates, edema or effusions. No worrisome pulmonary lesions. The bony thorax is intact. Remote upper left rib fractures noted. IMPRESSION: Emphysematous changes but no acute overlying pulmonary findings. Electronically Signed   By: Marijo Sanes M.D.   On: 06/18/2018 16:24   Assessment/ Plan: 53 y.o. female   1. COPD with acute exacerbation [facility]) Patient with clinical signs suggestive of COPD exacerbation.  However given decreased breath sounds in left lower lung field chest x-ray was obtained which demonstrated no acute pulmonary infiltrates.  COPD very evident on chest x-ray with flattened diaphragm.  Radiologist review confirmed emphysematous changes but no acute pneumonia is noted.  She was given a dose of Depo-Medrol 80 mg intramuscularly here in office.  Start prednisone burst tomorrow morning.  Start doxycycline this evening.  Advised to use albuterol inhaler 2 puffs every 6 hours scheduled for the next 2  days then as needed as directed.  Okay to continue Spiriva but hold Symbicort given use of steroids as above.  Keep appointment with pulmonology.  Reasons for return emergent valuation emergency department discussed.  Handout provided.  Follow-up PRN.  2. Shortness of breath - DG Chest 2 View; Future - predniSONE (DELTASONE) 20 MG tablet; Take 2 tablets (40 mg total) by mouth daily with breakfast for 5 days.  Dispense: 10 tablet; Refill: 0 -  doxycycline (VIBRA-TABS) 100 MG tablet; Take 1 tablet (100 mg total) by mouth 2 (two) times daily for 7 days.  Dispense: 14 tablet; Refill: 0 - methylPREDNISolone acetate (DEPO-MEDROL) injection 80 mg   Orders Placed This Encounter  Procedures   DG Chest 2 View    Standing Status:   Future    Standing Expiration Date:   08/18/2019    Order Specific Question:   Reason for Exam (SYMPTOM  OR DIAGNOSIS REQUIRED)    Answer:   sob x5 d, decreased breath sounds LLL    Order Specific Question:   Is patient pregnant?    Answer:   No    Order Specific Question:   Preferred imaging location?    Answer:   Internal    Order Specific Question:   Radiology Contrast Protocol - do NOT remove file path    Answer:   [email or web address]   Meds ordered this encounter  Medications   predniSONE (DELTASONE) 20 MG tablet    Sig: Take 2 tablets (40 mg total) by mouth daily with breakfast for 5 days.    Dispense:  10 tablet    Refill:  0   doxycycline (VIBRA-TABS) 100 MG tablet    Sig: Take 1 tablet (100 mg total) by mouth 2 (two) times daily for 7 days.    Dispense:  14 tablet    Refill:  0   methylPREDNISolone acetate (DEPO-MEDROL) injection 80 mg     Janora Norlander, DO [place] [phone]

## 2018-06-18 NOTE — Patient Instructions (Addendum)
   You were given a shot of steroid.  Start the Prednisone TOMORROW with breakfast.    Hold off on Symbicort while you are on prednisone.    Start Doxycyline with supper tonight.  Then tomorrow you can start taking with breakfast and supper.  Use the albuterol every 6 hours for the next 2 days.  Then go back to using it only if needed  Ok to continue Spiriva.  Your chest xray did not show a pneumonia but I am treating you with antibiotics to cover for pneumonia as above.  Chronic Obstructive Pulmonary Disease Exacerbation Chronic obstructive pulmonary disease (COPD) is a long-term (chronic) lung problem. In COPD, the flow of air from the lungs is limited. COPD exacerbations are times that breathing gets worse and you need more than your normal treatment. Without treatment, they can be life threatening. If they happen often, your lungs can become more damaged. If your COPD gets worse, your doctor may treat you with:  Medicines.  Oxygen.  Different ways to clear your airway, such as using a mask. Follow these instructions at home: Medicines  Take over-the-counter and prescription medicines only as told by your doctor.  If you take an antibiotic or steroid medicine, do not stop taking the medicine even if you start to feel better.  Keep up with shots (vaccinations) as told by your doctor. Be sure to get a yearly (annual) flu shot. Lifestyle  Do not smoke. If you need help quitting, ask your doctor.  Eat healthy foods.  Exercise regularly.  Get plenty of sleep.  Avoid tobacco smoke and other things that can bother your lungs.  Wash your hands often with soap and water. This will help keep you from getting an infection. If you cannot use soap and water, use hand sanitizer.  During flu season, avoid areas that are crowded with people. General instructions  Drink enough fluid to keep your pee (urine) clear or pale yellow. Do not do this if your doctor has told you not to.  Use  a cool mist machine (vaporizer).  If you use oxygen or a machine that turns medicine into a mist (nebulizer), continue to use it as told.  Follow all instructions for rehabilitation. These are steps you can take to make your body work better.  Keep all follow-up visits as told by your doctor. This is important. Contact a doctor if:  Your COPD symptoms get worse than normal. Get help right away if:  You are short of breath and it gets worse.  You have trouble talking.  You have chest pain.  You cough up blood.  You have a fever.  You keep throwing up (vomiting).  You feel weak or you pass out (faint).  You feel confused.  You are not able to sleep because of your symptoms.  You are not able to do daily activities. Summary  COPD exacerbations are times that breathing gets worse and you need more treatment than normal.  COPD exacerbations can be very serious and may cause your lungs to become more damaged.  Do not smoke. If you need help quitting, ask your doctor.  Stay up-to-date on your shots. Get a flu shot every year. This information is not intended to replace advice given to you by your health care provider. Make sure you discuss any questions you have with your health care provider. Document Released: 03/09/2011 Document Revised: 04/24/2016 Document Reviewed: 04/24/2016 Elsevier Interactive Patient Education  2019 [employer].

## 2018-07-03 ENCOUNTER — Institutional Professional Consult (permissible substitution): Payer: BLUE CROSS/BLUE SHIELD | Admitting: Pulmonary Disease

## 2018-07-15 ENCOUNTER — Encounter: Payer: Self-pay | Admitting: Family Medicine

## 2018-07-15 ENCOUNTER — Ambulatory Visit (INDEPENDENT_AMBULATORY_CARE_PROVIDER_SITE_OTHER): Payer: BLUE CROSS/BLUE SHIELD | Admitting: Family Medicine

## 2018-07-15 ENCOUNTER — Other Ambulatory Visit: Payer: Self-pay

## 2018-07-15 DIAGNOSIS — J439 Emphysema, unspecified: Secondary | ICD-10-CM | POA: Diagnosis not present

## 2018-07-15 DIAGNOSIS — I10 Essential (primary) hypertension: Secondary | ICD-10-CM | POA: Diagnosis not present

## 2018-07-15 DIAGNOSIS — F411 Generalized anxiety disorder: Secondary | ICD-10-CM | POA: Diagnosis not present

## 2018-07-15 DIAGNOSIS — R232 Flushing: Secondary | ICD-10-CM

## 2018-07-15 DIAGNOSIS — F324 Major depressive disorder, single episode, in partial remission: Secondary | ICD-10-CM

## 2018-07-15 MED ORDER — LISINOPRIL 10 MG PO TABS: ORAL_TABLET | ORAL | 2 refills | Status: DC

## 2018-07-15 MED ORDER — TRAZODONE HCL 50 MG PO TABS: 25.0000 mg | ORAL_TABLET | Freq: Every evening | ORAL | 1 refills | Status: DC | PRN

## 2018-07-15 MED ORDER — ESCITALOPRAM OXALATE 20 MG PO TABS: 20.0000 mg | ORAL_TABLET | Freq: Every day | ORAL | 1 refills | Status: DC

## 2018-07-15 NOTE — Progress Notes (Signed)
Telephone visit  Subjective: CC: follow up  PCP: Janora Norlander, DO RKY:HCWCBJ CARAN STORCK is a 53 y.o. female calls for telephone consult today. Patient provides verbal consent for consult held via phone.  Location of patient: home Location of provider: [facility] Others present for call: none  1. Hypertension Patient reports her blood pressure has been moderately controlled with lisinopril 10 mg daily.  She reports blood pressures range from 121-133/71-76.  She has had a few outliers including 2 blood pressure readings yesterday.  She had a reading of 141/104 and then later that evening a blood pressure reading of 99/68.  She denies any chest pain, shortness of breath, lower extremity edema or dizziness.  2.  COPD Patient reports compliance with Symbicort and Spiriva.  Her breathing is much improved from our last visit, during which time she was here for COPD exacerbation.  She notes total resolution in symptoms with the inhaler, prednisone and antibiotic.  No hemoptysis.  Her pulmonology appointment was rescheduled but she is awaiting the actual new appointment date.  This was rescheduled secondary to COVID-19  3. Depression/ anxiety Symptoms of depression anxiety are stable and controlled with Lexapro and buspirone.  However, she does note difficulty falling asleep and staying asleep.  She was prescribed a sleep aid in the past but unfortunately insurance did not cover the medication and it was too costly.  She is wondering if she could perhaps try something like trazodone.   ROS: Per HPI  No Known Allergies Past Medical History:  Diagnosis Date  . COPD (chronic obstructive pulmonary disease) ([place])   . Hypertension   . Hypothyroidism   . Tobacco abuse     Current Outpatient Medications:  .  albuterol (PROVENTIL HFA;VENTOLIN HFA) 108 (90 Base) MCG/ACT inhaler, Inhale 2 puffs into the lungs every 6 (six) hours as needed for wheezing or shortness of breath., Disp: 1 Inhaler, Rfl: 3 .   aspirin EC 81 MG EC tablet, Take 1 tablet (81 mg total) by mouth daily., Disp: 30 tablet, Rfl: 0 .  budesonide-formoterol (SYMBICORT) 160-4.5 MCG/ACT inhaler, Inhale 2 puffs into the lungs 2 (two) times daily., Disp: 1 Inhaler, Rfl: 1 .  busPIRone (BUSPAR) 10 MG tablet, Take 1 tablet (10 mg total) by mouth 3 (three) times daily., Disp: 270 tablet, Rfl: 1 .  cetirizine (ZYRTEC) 10 MG tablet, TAKE 1 TABLET BY MOUTH ONCE DAILY, Disp: 30 tablet, Rfl: 2 .  escitalopram (LEXAPRO) 20 MG tablet, TAKE 1 TABLET(20 MG) BY MOUTH DAILY, Disp: 90 tablet, Rfl: 0 .  fluticasone (FLONASE) 50 MCG/ACT nasal spray, Place 2 sprays into both nostrils daily., Disp: 16 g, Rfl: 6 .  gabapentin (NEURONTIN) 100 MG capsule, Take 1 capsule (100 mg total) by mouth at bedtime for 7 days, THEN 2 capsules (200 mg total) at bedtime for 7 days, THEN 3 capsules (300 mg total) at bedtime for 14 days., Disp: 63 capsule, Rfl: 0 .  levothyroxine (SYNTHROID, LEVOTHROID) 25 MCG tablet, Take 1 tablet (25 mcg total) by mouth daily before breakfast., Disp: 90 tablet, Rfl: 3 .  lisinopril (PRINIVIL,ZESTRIL) 10 MG tablet, TAKE 1 TABLET(10 MG) BY MOUTH DAILY, Disp: 90 tablet, Rfl: 2 .  naproxen (NAPROSYN) 500 MG tablet, Take 1 tablet (500 mg total) by mouth 2 (two) times daily as needed., Disp: 20 tablet, Rfl: 1 .  tiotropium (SPIRIVA) 18 MCG inhalation capsule, Place 1 capsule (18 mcg total) into inhaler and inhale daily as needed., Disp: 90 capsule, Rfl: 1  Assessment/ Plan: 53  y.o. female   1. Essential hypertension Relatively stable.  I do not think that we should titrate the medication up given isolated outlier, particularly since she had essentially hypotensive blood pressure later that evening.  Overall, blood pressures running well at home.  Lisinopril has been refilled. - lisinopril (PRINIVIL,ZESTRIL) 10 MG tablet; TAKE 1 TABLET(10 MG) BY MOUTH DAILY  Dispense: 90 tablet; Refill: 2  2. Pulmonary emphysema, unspecified emphysema type  ([place]) Stable, status post exacerbation treatment.  She is to follow-up with pulmonology as directed.  Continue inhalers  3. Generalized anxiety disorder Stable.  Lexapro has been refilled.  She does not need refills of buspirone at this time  4. Major depressive disorder with single episode, in partial remission ([place]) As above.  I have added trazodone for sleep.  5. Hot flashes Stable. - escitalopram (LEXAPRO) 20 MG tablet; Take 1 tablet (20 mg total) by mouth daily.  Dispense: 90 tablet; Refill: 1  Meds ordered this encounter  Medications  . lisinopril (PRINIVIL,ZESTRIL) 10 MG tablet    Sig: TAKE 1 TABLET(10 MG) BY MOUTH DAILY    Dispense:  90 tablet    Refill:  2    Needs to be seen before next refill  . escitalopram (LEXAPRO) 20 MG tablet    Sig: Take 1 tablet (20 mg total) by mouth daily.    Dispense:  90 tablet    Refill:  1  . traZODone (DESYREL) 50 MG tablet    Sig: Take 0.5-1 tablets (25-50 mg total) by mouth at bedtime as needed for sleep.    Dispense:  90 tablet    Refill:  1   Start time: 8:22am (attempted to contact 2 x but power was out, patient called from cell phone) End time: 8:29am  Total time spent on patient care (including telephone call/ virtual visit): 15 minutes.  Janora Norlander, DO [place] [phone]

## 2018-10-08 ENCOUNTER — Encounter: Payer: Self-pay | Admitting: Pulmonary Disease

## 2018-10-08 ENCOUNTER — Other Ambulatory Visit: Payer: Self-pay

## 2018-10-08 ENCOUNTER — Ambulatory Visit (INDEPENDENT_AMBULATORY_CARE_PROVIDER_SITE_OTHER): Payer: BC Managed Care – PPO | Admitting: Pulmonary Disease

## 2018-10-08 DIAGNOSIS — J439 Emphysema, unspecified: Secondary | ICD-10-CM | POA: Diagnosis not present

## 2018-10-08 MED ORDER — BUPROPION HCL ER (SR) 150 MG PO TB12: 150.0000 mg | ORAL_TABLET | Freq: Two times a day (BID) | ORAL | 2 refills | Status: DC

## 2018-10-08 NOTE — Progress Notes (Signed)
Subjective:     Patient ID: Amanda York, female   DOB: 11-19-1965, 53 y.o.   MRN: [id number]  Patient being seen for chronic obstructive pulmonary disease Shortness of breath  Breathing appears better than previously since she has been using inhalers on a regular basis Currently uses Symbicort 160 twice a day and Spiriva  Albuterol use about twice a day  She feels her breathing is better than previously Shortness of breath with significant exertion  She states that she is not limited with activities of daily living and with walking on level ground  She has a cough with clear phlegm production  No chest pain or chest discomfort  Did try to quit with Zyban-very bad dreams  No family history of cancers  No history of hemoptysis, no significant weight loss  No previous hospitalizations for COPD    Review of Systems  Constitutional: Negative.   HENT: Positive for sneezing.   Eyes: Negative.   Respiratory: Positive for cough, shortness of breath and wheezing.   Cardiovascular: Negative.   Gastrointestinal: Negative.   Endocrine: Negative.   Genitourinary: Negative.   Musculoskeletal: Negative.   Skin: Negative.   Allergic/Immunologic: Positive for environmental allergies.  Neurological: Negative.   Hematological: Bruises/bleeds easily.  Psychiatric/Behavioral: Positive for agitation and dysphoric mood. The patient is nervous/anxious.    Past Medical History:  Diagnosis Date  . Atypical chest pain 10/20/2014  . Chest pain 10/19/2014  . COPD (chronic obstructive pulmonary disease) ([place])   . Hypertension   . Hypothyroidism   . Tobacco abuse    Social History   Socioeconomic History  . Marital status: Married    Spouse name: Not on file  . Number of children: Not on file  . Years of education: Not on file  . Highest education level: Not on file  Occupational History  . Not on file  Social Needs  . Financial resource strain: Not on file  . Food insecurity    Worry: Not on file    Inability: Not on file  . Transportation needs    Medical: Not on file    Non-medical: Not on file  Tobacco Use  . Smoking status: Current Every Day Smoker    Packs/day: 1.00    Years: 41.00    Pack years: 41.00    Types: Cigarettes  . Smokeless tobacco: Never Used  . Tobacco comment: Since age 24  Substance and Sexual Activity  . Alcohol use: No    Alcohol/week: 0.0 standard drinks  . Drug use: No  . Sexual activity: Yes  Lifestyle  . Physical activity    Days per week: Not on file    Minutes per session: Not on file  . Stress: Not on file  Relationships  . Social Herbalist on phone: Not on file    Gets together: Not on file    Attends religious service: Not on file    Active member of club or organization: Not on file    Attends meetings of clubs or organizations: Not on file    Relationship status: Not on file  . Intimate partner violence    Fear of current or ex partner: Not on file    Emotionally abused: Not on file    Physically abused: Not on file    Forced sexual activity: Not on file  Other Topics Concern  . Not on file  Social History Narrative  . Not on file   Family History  Problem  Relation Age of Onset  . CAD Father        Died at 26 of MI  . Coronary artery disease Father   . CAD Paternal Uncle        Died at 80 of MI  . CAD Paternal Grandfather        Details unclaer  . COPD Sister        Objective:   Physical Exam Vitals signs reviewed.  Constitutional:      Appearance: Normal appearance.  HENT:     Head: Normocephalic and atraumatic.  Eyes:     Extraocular Movements: Extraocular movements intact.     Pupils: Pupils are equal, round, and reactive to light.  Neck:     Musculoskeletal: Normal range of motion and neck supple. No neck rigidity or muscular tenderness.  Cardiovascular:     Rate and Rhythm: Normal rate and regular rhythm.     Heart sounds: No murmur.  Pulmonary:     Effort: Pulmonary  effort is normal. No respiratory distress.     Breath sounds: No stridor. Rhonchi present. No wheezing or rales.  Abdominal:     General: There is no distension.     Tenderness: There is no abdominal tenderness.  Musculoskeletal: Normal range of motion.        General: No swelling.  Skin:    General: Skin is warm and dry.     Coloration: Skin is not jaundiced.  Neurological:     General: No focal deficit present.     Mental Status: She is alert.     Cranial Nerves: No cranial nerve deficit.  Psychiatric:        Mood and Affect: Mood normal.    Vitals:   10/08/18 1358  BP: 124/80  Pulse: 82  Temp: 98.1 F (36.7 C)  SpO2: 95%   Most recent chest x-ray 06/18/2018 reviewed by myself showing increased lung volumes suggesting emphysema  Medical records reviewed      Assessment:     COPD  Shortness of breath only with significant exertion  Chronic cough likely related to her COPD  Active smoker  Risk of continuing to smoke was extensively discussed She is willing to try to quit, Zyban did not help her in the past secondary to side effects  Depression -Adequately controlled     Plan:     Obtain CT scan of the chest without contrast Obtain pulmonary function study Continue Symbicort and Spiriva Did discuss switching inhalers if above does not continue to help  We did talk extensively about smoking cessation She is willing to try Zyban-prescription sent into pharmacy  Regular exercise encouraged

## 2018-10-08 NOTE — Patient Instructions (Signed)
Chronic obstructive pulmonary disease Smoking  It is paramount that you continue to work on quitting smoking  Wellbutrin was sent to the pharmacy for you Start with 150 mg daily for 3 days and then increase to 150 twice a day  Try and stay off cigarettes as best as we can  We will obtain a CT scan of the chest We will obtain a pulmonary function study  I will see you back in the office in about 3 months  Call with any significant concerns

## 2018-10-15 ENCOUNTER — Ambulatory Visit (HOSPITAL_COMMUNITY)
Admission: RE | Admit: 2018-10-15 | Discharge: 2018-10-15 | Disposition: A | Discharge status: Home / Self Care | Payer: BC Managed Care – PPO | Source: Ambulatory Visit | Attending: Pulmonary Disease | Admitting: Pulmonary Disease

## 2018-10-15 ENCOUNTER — Other Ambulatory Visit: Payer: Self-pay

## 2018-10-15 DIAGNOSIS — J439 Emphysema, unspecified: Secondary | ICD-10-CM | POA: Diagnosis not present

## 2018-10-15 DIAGNOSIS — R0602 Shortness of breath: Secondary | ICD-10-CM | POA: Diagnosis not present

## 2018-12-12 ENCOUNTER — Telehealth: Payer: Self-pay | Admitting: Family Medicine

## 2018-12-12 NOTE — Telephone Encounter (Signed)
Patient would like to receive 2nd Shingrix vaccine and talk to Dr. Lajuana Ripple about increasing her Trazodone.  Appointment scheduled for 12/25/2018 at 11:00 am with Dr. Lajuana Ripple.

## 2018-12-24 ENCOUNTER — Other Ambulatory Visit: Payer: Self-pay

## 2018-12-25 ENCOUNTER — Encounter: Payer: Self-pay | Admitting: Family Medicine

## 2018-12-25 ENCOUNTER — Ambulatory Visit (INDEPENDENT_AMBULATORY_CARE_PROVIDER_SITE_OTHER): Payer: BC Managed Care – PPO | Admitting: Family Medicine

## 2018-12-25 VITALS — BP 120/84 | HR 78 | Temp 98.4°F | Ht 61.0 in | Wt 127.0 lb

## 2018-12-25 DIAGNOSIS — F324 Major depressive disorder, single episode, in partial remission: Secondary | ICD-10-CM

## 2018-12-25 DIAGNOSIS — J209 Acute bronchitis, unspecified: Secondary | ICD-10-CM

## 2018-12-25 DIAGNOSIS — Z23 Encounter for immunization: Secondary | ICD-10-CM

## 2018-12-25 DIAGNOSIS — J440 Chronic obstructive pulmonary disease with acute lower respiratory infection: Secondary | ICD-10-CM

## 2018-12-25 DIAGNOSIS — G479 Sleep disorder, unspecified: Secondary | ICD-10-CM | POA: Diagnosis not present

## 2018-12-25 DIAGNOSIS — F411 Generalized anxiety disorder: Secondary | ICD-10-CM | POA: Diagnosis not present

## 2018-12-25 DIAGNOSIS — Z720 Tobacco use: Secondary | ICD-10-CM

## 2018-12-25 DIAGNOSIS — E039 Hypothyroidism, unspecified: Secondary | ICD-10-CM

## 2018-12-25 DIAGNOSIS — Z1239 Encounter for other screening for malignant neoplasm of breast: Secondary | ICD-10-CM

## 2018-12-25 MED ORDER — METHYLPREDNISOLONE ACETATE 40 MG/ML IJ SUSP
40.0000 mg | Freq: Once | INTRAMUSCULAR | Status: AC
  Administered 2018-12-25: 40 mg via INTRAMUSCULAR

## 2018-12-25 MED ORDER — TRAZODONE HCL 50 MG PO TABS: 50.0000 mg | ORAL_TABLET | Freq: Every evening | ORAL | 1 refills | Status: DC | PRN

## 2018-12-25 MED ORDER — ESCITALOPRAM OXALATE 20 MG PO TABS: 20.0000 mg | ORAL_TABLET | Freq: Every day | ORAL | 1 refills | Status: DC

## 2018-12-25 NOTE — Patient Instructions (Signed)
We discussed the increased risk for Serotonin syndrome with Lexapro and Trazodone.  Serotonin Syndrome Serotonin is a chemical in your body (neurotransmitter) that helps to control several functions, such as:  Brain and nerve cell function.  Mood and emotions.  Memory.  Eating.  Sleeping.  Sexual activity.  Stress response. Having too much serotonin in your body can cause serotonin syndrome. This condition can be harmful to your brain and nerve cells. This can be a life-threatening condition. What are the causes? This condition may be caused by taking medicines or drugs that increase the level of serotonin in your body, such as:  Antidepressant medicines.  Migraine medicines.  Certain pain medicines.  Certain drugs, including ecstasy, LSD, cocaine, and amphetamines.  Over-the-counter cough or cold medicines that contain dextromethorphan.  Certain herbal supplements, including St. John's wort, ginseng, and nutmeg. This condition usually occurs when you take these medicines or drugs in combination, but it can also happen with a high dose of a single medicine or drug. What increases the risk? You are more likely to develop this condition if:  You just started taking a medicine or drug that increases the level of serotonin in the body.  You recently increased the dose of a medicine or drug that increases the level of serotonin in the body.  You take more than one medicine or drug that increases the level of serotonin in the body. What are the signs or symptoms? Symptoms of this condition usually start within several hours of taking a medicine or drug. Symptoms may be mild or severe. Mild symptoms include:  Sweating.  Restlessness or agitation.  Muscle twitching or stiffness.  Rapid heart rate.  Nausea and vomiting.  Diarrhea.  Headache.  Shivering or goose bumps.  Confusion. Severe symptoms include:  Irregular heartbeat.  Seizures.  Loss of  consciousness.  High fever. How is this diagnosed? This condition may be diagnosed based on:  Your medical history.  A physical exam.  Your prior use of drugs and medicines.  Blood or urine tests. These may be used to rule out other causes of your symptoms. How is this treated? The treatment for this condition depends on the severity of your symptoms.  For mild cases, stopping the medicine or drug that caused your condition is usually all that is needed.  For moderate to severe cases, treatment in a hospital may be needed to prevent or manage life-threatening symptoms. This may include medicines to control your symptoms, IV fluids, interventions to support your breathing, and treatments to control your body temperature. Follow these instructions at home: Medicines   Take over-the-counter and prescription medicines only as told by your health care provider. This is important.  Check with your health care provider before you start taking any new prescriptions, over-the-counter medicines, herbs, or supplements.  Avoid combining any medicines that can cause this condition to occur. Lifestyle   Maintain a healthy lifestyle. ? Eat a healthy diet that includes plenty of vegetables, fruits, whole grains, low-fat dairy products, and lean protein. Do not eat a lot of foods that are high in fat, added sugars, or salt. ? Get the right amount and quality of sleep. Most adults need 7-9 hours of sleep each night. ? Make time to exercise, even if it is only for short periods of time. Most adults should exercise for at least 150 minutes each week. ? Do not drink alcohol. ? Do not use illegal drugs, and do not take medicines for reasons other than they  are prescribed. General instructions  Do not use any products that contain nicotine or tobacco, such as cigarettes and e-cigarettes. If you need help quitting, ask your health care provider.  Keep all follow-up visits as told by your health  care provider. This is important. Contact a health care provider if:  Your symptoms do not improve or they get worse. Get help right away if you:  Have worsening confusion, severe headache, chest pain, high fever, seizures, or loss of consciousness.  Experience serious side effects of medicine, such as swelling of your face, lips, tongue, or throat.  Have serious thoughts about hurting yourself or others. These symptoms may represent a serious problem that is an emergency. Do not wait to see if the symptoms will go away. Get medical help right away. Call your local emergency services (911 in the [place] Do not drive yourself to the hospital. If you ever feel like you may hurt yourself or others, or have thoughts about taking your own life, get help right away. You can go to your nearest emergency department or call:  Your local emergency services (911 in the [place]  A suicide crisis helpline, such as the [facility] at 865 773 9245. This is open 24 hours a day. Summary  Serotonin is a brain chemical that helps to regulate the nervous system. High levels of serotonin in the body can cause serotonin syndrome, which is a very dangerous condition.  This condition may be caused by taking medicines or drugs that increase the level of serotonin in your body.  Treatment depends on the severity of your symptoms. For mild cases, stopping the medicine or drug that caused your condition is usually all that is needed.  Check with your health care provider before you start taking any new prescriptions, over-the-counter medicines, herbs, or supplements. This information is not intended to replace advice given to you by your health care provider. Make sure you discuss any questions you have with your health care provider. Document Released: 04/27/2004 Document Revised: 04/27/2017 Document Reviewed: 04/27/2017 Elsevier Patient Education  2020 [employer].

## 2018-12-25 NOTE — Progress Notes (Signed)
 Subjective: CC: f/u sleep, needs shingles shot PCP: Gottschalk, Ashly M, DO HPI:Amanda York is a 53 y.o. female presenting to clinic today for:  1.  Anxiety disorder/insomnia Patient reports compliance with BuSpar 10 mg twice daily and Lexapro 20 mg daily.  She has been using trazodone 50 to 75 mg nightly as needed sleep and notes that her sleep has improved dramatically.  She denies any adverse side effects including excessive daytime sleepiness.  She did try a Wellbutrin that was prescribed to her by an outside provider but notes that this caused her symptoms to be exacerbated.  She self discontinued.  2.  Hypothyroidism Patient reports compliance with Synthroid 25 mcg daily.  Denies any change in voice, difficulty swallowing, tremor or heart palpitation.  No change in bowel habit or unplanned weight loss or weight gain.  3.  COPD Patient reports compliance with Symbicort twice daily and Spiriva daily.  She notes that she has been having a little bit more wheezing and shortness of breath with exertion over the last 3 weeks.  She has follow-up with pulmonology soon for pulmonary function testing.  She is looking forward to this.  Denies any increasing productive cough, fevers, hemoptysis.  She smokes.   ROS: Per HPI  No Known Allergies Past Medical History:  Diagnosis Date  . Atypical chest pain 10/20/2014  . Chest pain 10/19/2014  . COPD (chronic obstructive pulmonary disease) (HCC)   . Hypertension   . Hypothyroidism   . Tobacco abuse     Current Outpatient Medications:  .  albuterol (PROVENTIL HFA;VENTOLIN HFA) 108 (90 Base) MCG/ACT inhaler, Inhale 2 puffs into the lungs every 6 (six) hours as needed for wheezing or shortness of breath., Disp: 1 Inhaler, Rfl: 3 .  aspirin EC 81 MG EC tablet, Take 1 tablet (81 mg total) by mouth daily., Disp: 30 tablet, Rfl: 0 .  budesonide-formoterol (SYMBICORT) 160-4.5 MCG/ACT inhaler, Inhale 2 puffs into the lungs 2 (two) times daily.,  Disp: 1 Inhaler, Rfl: 1 .  buPROPion (WELLBUTRIN SR) 150 MG 12 hr tablet, Take 1 tablet (150 mg total) by mouth 2 (two) times daily., Disp: 60 tablet, Rfl: 2 .  busPIRone (BUSPAR) 10 MG tablet, Take 1 tablet (10 mg total) by mouth 3 (three) times daily., Disp: 270 tablet, Rfl: 1 .  cetirizine (ZYRTEC) 10 MG tablet, TAKE 1 TABLET BY MOUTH ONCE DAILY, Disp: 30 tablet, Rfl: 2 .  escitalopram (LEXAPRO) 20 MG tablet, Take 1 tablet (20 mg total) by mouth daily., Disp: 90 tablet, Rfl: 1 .  levothyroxine (SYNTHROID, LEVOTHROID) 25 MCG tablet, Take 1 tablet (25 mcg total) by mouth daily before breakfast., Disp: 90 tablet, Rfl: 3 .  lisinopril (PRINIVIL,ZESTRIL) 10 MG tablet, TAKE 1 TABLET(10 MG) BY MOUTH DAILY, Disp: 90 tablet, Rfl: 2 .  tiotropium (SPIRIVA) 18 MCG inhalation capsule, Place 1 capsule (18 mcg total) into inhaler and inhale daily as needed., Disp: 90 capsule, Rfl: 1 .  traZODone (DESYREL) 50 MG tablet, Take 0.5-1 tablets (25-50 mg total) by mouth at bedtime as needed for sleep., Disp: 90 tablet, Rfl: 1 Social History   Socioeconomic History  . Marital status: Married    Spouse name: Not on file  . Number of children: Not on file  . Years of education: Not on file  . Highest education level: Not on file  Occupational History  . Not on file  Social Needs  . Financial resource strain: Not on file  . Food insecurity      Worry: Not on file    Inability: Not on file  . Transportation needs    Medical: Not on file    Non-medical: Not on file  Tobacco Use  . Smoking status: Current Every Day Smoker    Packs/day: 1.00    Years: 41.00    Pack years: 41.00    Types: Cigarettes  . Smokeless tobacco: Never Used  . Tobacco comment: Since age 42  Substance and Sexual Activity  . Alcohol use: No    Alcohol/week: 0.0 standard drinks  . Drug use: No  . Sexual activity: Yes  Lifestyle  . Physical activity    Days per week: Not on file    Minutes per session: Not on file  . Stress: Not  on file  Relationships  . Social Herbalist on phone: Not on file    Gets together: Not on file    Attends religious service: Not on file    Active member of club or organization: Not on file    Attends meetings of clubs or organizations: Not on file    Relationship status: Not on file  . Intimate partner violence    Fear of current or ex partner: Not on file    Emotionally abused: Not on file    Physically abused: Not on file    Forced sexual activity: Not on file  Other Topics Concern  . Not on file  Social History Narrative  . Not on file   Family History  Problem Relation Age of Onset  . CAD Father        Died at 45 of MI  . Coronary artery disease Father   . CAD Paternal Uncle        Died at 78 of MI  . CAD Paternal Grandfather        Details unclaer  . COPD Sister     Objective: Office vital signs reviewed. BP 120/84   Pulse 78   Temp 98.4 F (36.9 C) (Temporal)   Ht 5' 1" (1.549 m)   Wt 127 lb (57.6 kg)   SpO2 94%   BMI 24.00 kg/m   Physical Examination:  General: Awake, alert, well nourished, No acute distress HEENT: Normal; sclera white.  No exophthalmos.  No palpable goiter or nodules Cardio: regular rate and rhythm, S1S2 heard, no murmurs appreciated Pulm: Rhonchi noted throughout the lung fields.  She has fair air movement.  No significant wheezing noted.  She has normal work of breathing on room air Extremities: warm, well perfused, No edema, cyanosis or clubbing; +2 pulses bilaterally Skin: dry; intact; no rashes or lesions; normal temperature Neuro: No tremor Psych: Mood stable, speech normal, affect improved, pleasant and interactive Depression screen [facility] 2/9 12/25/2018 06/18/2018 06/04/2018  Decreased Interest 0 0 2  Down, Depressed, Hopeless 0 0 2  PHQ - 2 Score 0 0 4  Altered sleeping 0 0 1  Tired, decreased energy 0 0 1  Change in appetite 0 0 1  Feeling bad or failure about yourself  0 0 0  Trouble concentrating 0 - 0  Moving  slowly or fidgety/restless 0 0 1  Suicidal thoughts 0 0 0  PHQ-9 Score 0 0 8  Difficult doing work/chores - - -  Some recent data might be hidden   GAD 7 : Generalized Anxiety Score 12/25/2018 06/04/2018 05/21/2018 12/07/2017  Nervous, Anxious, on Edge _0 Control/stop worrying 0 _1 Worry too much -  different things 0 2 1 3  Trouble relaxing 1 2 1 3  Restless 0 2 1 3  Easily annoyed or irritable 1 2 1 3  Afraid - awful might happen 0 2 1 3  Total GAD 7 Score 3 14 7 21  Anxiety Difficulty Not difficult at all - Somewhat difficult Somewhat difficult    Assessment/ Plan: 53 y.o. female   1. Generalized anxiety disorder Under much better control with the BuSpar and Lexapro.  I have put the Wellbutrin on her intolerance list as the seem to exacerbate her anxiety and panic disorder - escitalopram (LEXAPRO) 20 MG tablet; Take 1 tablet (20 mg total) by mouth daily.  Dispense: 90 tablet; Refill: 1  2. Major depressive disorder with single episode, in partial remission (HCC) Controlled - escitalopram (LEXAPRO) 20 MG tablet; Take 1 tablet (20 mg total) by mouth daily.  Dispense: 90 tablet; Refill: 1  3. Sleep difficulties Improving with trazodone.  We discussed the risk of serotonin syndrome particularly given use of Lexapro.  Would recommend keeping the trazodone as low as possible to reduce this risk.  I gave her handout on serotonin syndrome and signs and symptoms warranting immediate evaluation.  She voiced good understanding  4. Acquired hypothyroidism Asymptomatic.  Check thyroid panel - Thyroid Panel With TSH - CMP14+EGFR - Lipid Panel  5. Acute bronchitis with COPD (HCC) I think this is more likely to be uncontrolled COPD rather than an acute exacerbation.  I have given her steroid today to help relieve some of the symptoms she has been experiencing.  She has follow-up with her pulmonologist in about a week and a half for pulmonary function testing.  Would consider advancing  therapy if appropriate from Symbicort possibly Trelegy or similar - methylPREDNISolone acetate (DEPO-MEDROL) injection 40 mg  6. Need for shingles vaccine Administered  7. Screening for breast cancer Will get set up at [facility].  NO symptoms. - MM 3D SCREEN BREAST BILATERAL; Future   Orders Placed This Encounter  Procedures  . Thyroid Panel With TSH  . CMP14+EGFR  . Lipid Panel   Meds ordered this encounter  Medications  . methylPREDNISolone acetate (DEPO-MEDROL) injection 40 mg  . escitalopram (LEXAPRO) 20 MG tablet    Sig: Take 1 tablet (20 mg total) by mouth daily.    Dispense:  90 tablet    Refill:  1  . traZODone (DESYREL) 50 MG tablet    Sig: Take 1-1.5 tablets (50-75 mg total) by mouth at bedtime as needed for sleep.    Dispense:  135 tablet    Refill:  1     Ashly M Gottschalk, DO [facility] [phone]   

## 2018-12-26 LAB — THYROID PANEL WITH TSH
Free Thyroxine Index: 2 (ref 1.2–4.9)
T3 Uptake Ratio: 23 % — ABNORMAL LOW (ref 24–39)
T4, Total: 8.5 ug/dL (ref 4.5–12.0)
TSH: 1.55 u[IU]/mL (ref 0.450–4.500)

## 2018-12-26 LAB — CMP14+EGFR
ALT: 9 IU/L (ref 0–32)
AST: 17 IU/L (ref 0–40)
Albumin/Globulin Ratio: 2.5 — ABNORMAL HIGH (ref 1.2–2.2)
Albumin: 4.5 g/dL (ref 3.8–4.9)
Alkaline Phosphatase: 60 IU/L (ref 39–117)
BUN/Creatinine Ratio: 6 — ABNORMAL LOW (ref 9–23)
BUN: 5 mg/dL — ABNORMAL LOW (ref 6–24)
Bilirubin Total: 0.3 mg/dL (ref 0.0–1.2)
CO2: 24 mmol/L (ref 20–29)
Calcium: 9.2 mg/dL (ref 8.7–10.2)
Chloride: 98 mmol/L (ref 96–106)
Creatinine, Ser: 0.79 mg/dL (ref 0.57–1.00)
GFR calc Af Amer: 98 mL/min/{1.73_m2} (ref >59)
GFR calc non Af Amer: 85 mL/min/{1.73_m2} (ref >59)
Globulin, Total: 1.8 g/dL (ref 1.5–4.5)
Glucose: 76 mg/dL (ref 65–99)
Potassium: 4.4 mmol/L (ref 3.5–5.2)
Sodium: 140 mmol/L (ref 134–144)
Total Protein: 6.3 g/dL (ref 6.0–8.5)

## 2018-12-26 LAB — LIPID PANEL
Chol/HDL Ratio: 3.3 ratio (ref 0.0–4.4)
Cholesterol, Total: 253 mg/dL — ABNORMAL HIGH (ref 100–199)
HDL: 77 mg/dL (ref >39)
LDL Chol Calc (NIH): 160 mg/dL — ABNORMAL HIGH (ref 0–99)
Triglycerides: 94 mg/dL (ref 0–149)
VLDL Cholesterol Cal: 16 mg/dL (ref 5–40)

## 2018-12-27 ENCOUNTER — Other Ambulatory Visit: Payer: Self-pay | Admitting: Family Medicine

## 2018-12-27 ENCOUNTER — Other Ambulatory Visit: Payer: Self-pay | Admitting: *Deleted

## 2018-12-27 DIAGNOSIS — E039 Hypothyroidism, unspecified: Secondary | ICD-10-CM

## 2018-12-27 MED ORDER — ATORVASTATIN CALCIUM 10 MG PO TABS: 10.0000 mg | ORAL_TABLET | Freq: Every day | ORAL | 3 refills | Status: DC

## 2018-12-27 MED ORDER — LEVOTHYROXINE SODIUM 25 MCG PO TABS: 25.0000 ug | ORAL_TABLET | Freq: Every day | ORAL | 3 refills | Status: DC

## 2018-12-28 ENCOUNTER — Other Ambulatory Visit: Payer: Self-pay | Admitting: Family Medicine

## 2019-01-06 ENCOUNTER — Encounter: Payer: Self-pay | Admitting: Pulmonary Disease

## 2019-01-06 ENCOUNTER — Other Ambulatory Visit: Payer: Self-pay

## 2019-01-06 ENCOUNTER — Ambulatory Visit: Payer: BC Managed Care – PPO | Admitting: Pulmonary Disease

## 2019-01-06 VITALS — BP 118/70 | HR 60 | Temp 97.2°F | Ht 61.0 in | Wt 126.0 lb

## 2019-01-06 DIAGNOSIS — J439 Emphysema, unspecified: Secondary | ICD-10-CM

## 2019-01-06 DIAGNOSIS — R0602 Shortness of breath: Secondary | ICD-10-CM

## 2019-01-06 MED ORDER — TRELEGY ELLIPTA 100-62.5-25 MCG/INH IN AEPB: 1.0000 | INHALATION_SPRAY | Freq: Every day | RESPIRATORY_TRACT | 5 refills | Status: DC

## 2019-01-06 MED ORDER — TRELEGY ELLIPTA 100-62.5-25 MCG/INH IN AEPB: 1.0000 | INHALATION_SPRAY | Freq: Every day | RESPIRATORY_TRACT | 0 refills | Status: DC

## 2019-01-06 NOTE — Patient Instructions (Signed)
Chronic obstructive pulmonary disease Extensive emphysema on CT scan  Make sure you get the breathing study done  We will switch you from Symbicort and Spiriva to Trelegy  Call if there is a change in your sputum production  Continue to work on smoking cessation  I will see you back in the office in 3 months

## 2019-01-06 NOTE — Progress Notes (Signed)
Subjective:     Patient ID: Amanda York, female   DOB: [birth year], 53 y.o.   MRN: [id number]  Patient being seen for chronic obstructive pulmonary disease Shortness of breath  She is still smoking about half a pack a day Limited with activities  Compliant with Symbicort and Spiriva Not noticing any significant changes in symptoms  She does have a cough, postnasal drip Phlegm is clear She has no fever or chills, no night sweats, appetite is maintained  Did not tolerate Zyban in the past Wellbutrin was "messing with her mind"  She is still reaching for albuterol for relief   No chest pain or chest discomfort  No family history of cancer She has no hemoptysis, no recent ED visits No previous hospitalizations for COPD   Review of Systems  Constitutional: Negative.   HENT: Positive for sneezing.        Nasal stuffiness  Eyes: Negative.   Respiratory: Positive for cough and shortness of breath. Negative for wheezing.   Cardiovascular: Negative.   Gastrointestinal: Negative.   Endocrine: Negative.   Genitourinary: Negative.   Musculoskeletal: Negative.   Skin: Negative.   Allergic/Immunologic: Negative for environmental allergies.  Neurological: Negative.   Hematological: Does not bruise/bleed easily.  Psychiatric/Behavioral: Positive for agitation and dysphoric mood. The patient is nervous/anxious.    Past Medical History:  Diagnosis Date  . Atypical chest pain 10/20/2014  . Chest pain 10/19/2014  . COPD (chronic obstructive pulmonary disease) ([place])   . Hypertension   . Hypothyroidism   . Tobacco abuse    Social History   Socioeconomic History  . Marital status: Married    Spouse name: Not on file  . Number of children: Not on file  . Years of education: Not on file  . Highest education level: Not on file  Occupational History  . Not on file  Social Needs  . Financial resource strain: Not on file  . Food insecurity    Worry: Not on file    Inability:  Not on file  . Transportation needs    Medical: Not on file    Non-medical: Not on file  Tobacco Use  . Smoking status: Current Every Day Smoker    Packs/day: 1.00    Years: 41.00    Pack years: 41.00    Types: Cigarettes  . Smokeless tobacco: Never Used  . Tobacco comment: Since age 8  Substance and Sexual Activity  . Alcohol use: No    Alcohol/week: 0.0 standard drinks  . Drug use: No  . Sexual activity: Yes  Lifestyle  . Physical activity    Days per week: Not on file    Minutes per session: Not on file  . Stress: Not on file  Relationships  . Social Herbalist on phone: Not on file    Gets together: Not on file    Attends religious service: Not on file    Active member of club or organization: Not on file    Attends meetings of clubs or organizations: Not on file    Relationship status: Not on file  . Intimate partner violence    Fear of current or ex partner: Not on file    Emotionally abused: Not on file    Physically abused: Not on file    Forced sexual activity: Not on file  Other Topics Concern  . Not on file  Social History Narrative  . Not on file   Family History  Problem  Relation Age of Onset  . CAD Father        Died at 4 of MI  . Coronary artery disease Father   . CAD Paternal Uncle        Died at 25 of [place]  . CAD Paternal Grandfather        Details unclaer  . COPD Sister        Objective:   Physical Exam Vitals signs reviewed.  Constitutional:      Appearance: Normal appearance.  HENT:     Head: Normocephalic and atraumatic.     Nose: Nose normal. No congestion or rhinorrhea.  Eyes:     Extraocular Movements: Extraocular movements intact.     Pupils: Pupils are equal, round, and reactive to light.  Neck:     Musculoskeletal: Normal range of motion and neck supple. No neck rigidity or muscular tenderness.  Cardiovascular:     Rate and Rhythm: Normal rate and regular rhythm.     Heart sounds: No murmur.  Pulmonary:      Effort: Pulmonary effort is normal. No respiratory distress.     Breath sounds: No stridor. No wheezing, rhonchi or rales.  Chest:     Chest wall: No tenderness.  Abdominal:     General: Abdomen is flat. There is no distension.     Palpations: There is no mass.     Tenderness: There is no abdominal tenderness.     Hernia: No hernia is present.  Musculoskeletal: Normal range of motion.        General: No swelling.  Skin:    General: Skin is warm and dry.     Coloration: Skin is not jaundiced.  Neurological:     General: No focal deficit present.     Mental Status: She is alert.     Cranial Nerves: No cranial nerve deficit.  Psychiatric:        Mood and Affect: Mood normal.    Vitals:   01/06/19 0903  BP: 118/70  Pulse: 60  Temp: (!) 97.2 F (36.2 C)  SpO2: 98%   Most recent chest x-ray 06/18/2018 reviewed by myself showing increased lung volumes suggesting emphysema  CT scan of the chest reviewed by myself showing extensive emphysema Groundglass opacification at the base-limited by motion artifact  Medical records reviewed      Assessment:     COPD -Not controlled symptoms at present -Continues to smoke actively -Limited with activities  Shortness of breath only with significant exertion -Unfortunately continues to smoke -Compliant with inhalers -Significant emphysema on CT  Chronic cough likely related to her COPD  Active smoker -Did not tolerate Zyban -Did not tolerate Chantix in the past  Risk of continuing to smoke was extensively discussed -Counseled extensively about the need to quit smoking  Depression -Adequately controlled     Plan:     Encouraged to follow-up with PFT-greater severity of COPD Does have extensive emphysema Switch Symbicort and Spiriva to Trelegy  Inhaler technique reviewed  Use of albuterol as needed  We did talk extensively about smoking cessation  We will repeat CT scan of the chest in 6 months  Regular exercise  encouraged  Risk of progressive disease, risk of exacerbations, risk of hospitalizations discussed  Importance of quitting smoking discussed    For nasal stuffiness/congestion-Flonase-she does have a prescription for this

## 2019-02-06 ENCOUNTER — Other Ambulatory Visit: Payer: Self-pay | Admitting: Family Medicine

## 2019-02-06 DIAGNOSIS — F411 Generalized anxiety disorder: Secondary | ICD-10-CM

## 2019-02-06 DIAGNOSIS — F324 Major depressive disorder, single episode, in partial remission: Secondary | ICD-10-CM

## 2019-02-17 ENCOUNTER — Other Ambulatory Visit: Payer: Self-pay | Admitting: Family Medicine

## 2019-02-17 DIAGNOSIS — F419 Anxiety disorder, unspecified: Secondary | ICD-10-CM
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
## 2019-06-04 ENCOUNTER — Other Ambulatory Visit: Payer: Self-pay | Admitting: Family Medicine

## 2019-06-04 DIAGNOSIS — I10 Essential (primary) hypertension: Secondary | ICD-10-CM

## 2019-06-19 ENCOUNTER — Other Ambulatory Visit: Payer: Self-pay | Admitting: Family Medicine

## 2019-07-02 ENCOUNTER — Other Ambulatory Visit: Payer: Self-pay | Admitting: Family Medicine

## 2019-07-02 DIAGNOSIS — I10 Essential (primary) hypertension: Secondary | ICD-10-CM

## 2019-07-30 DIAGNOSIS — R1013 Epigastric pain: Secondary | ICD-10-CM | POA: Diagnosis not present

## 2019-07-31 ENCOUNTER — Other Ambulatory Visit: Payer: Self-pay | Admitting: Family Medicine

## 2019-07-31 DIAGNOSIS — F411 Generalized anxiety disorder: Secondary | ICD-10-CM

## 2019-07-31 DIAGNOSIS — F324 Major depressive disorder, single episode, in partial remission: Secondary | ICD-10-CM

## 2019-08-20 ENCOUNTER — Other Ambulatory Visit: Payer: Self-pay

## 2019-08-20 ENCOUNTER — Encounter: Payer: Self-pay | Admitting: Family Medicine

## 2019-08-20 ENCOUNTER — Ambulatory Visit (INDEPENDENT_AMBULATORY_CARE_PROVIDER_SITE_OTHER): Payer: BC Managed Care – PPO | Admitting: Family Medicine

## 2019-08-20 VITALS — BP 138/88 | HR 84 | Temp 98.3°F | Ht 61.0 in | Wt 132.0 lb

## 2019-08-20 DIAGNOSIS — Z01411 Encounter for gynecological examination (general) (routine) with abnormal findings: Secondary | ICD-10-CM

## 2019-08-20 DIAGNOSIS — F324 Major depressive disorder, single episode, in partial remission: Secondary | ICD-10-CM

## 2019-08-20 DIAGNOSIS — R1013 Epigastric pain: Secondary | ICD-10-CM

## 2019-08-20 DIAGNOSIS — Z114 Encounter for screening for human immunodeficiency virus [HIV]: Secondary | ICD-10-CM | POA: Diagnosis not present

## 2019-08-20 DIAGNOSIS — Z124 Encounter for screening for malignant neoplasm of cervix: Secondary | ICD-10-CM | POA: Diagnosis not present

## 2019-08-20 DIAGNOSIS — F411 Generalized anxiety disorder: Secondary | ICD-10-CM

## 2019-08-20 DIAGNOSIS — Z1211 Encounter for screening for malignant neoplasm of colon: Secondary | ICD-10-CM

## 2019-08-20 DIAGNOSIS — R232 Flushing: Secondary | ICD-10-CM

## 2019-08-20 DIAGNOSIS — E039 Hypothyroidism, unspecified: Secondary | ICD-10-CM | POA: Diagnosis not present

## 2019-08-20 DIAGNOSIS — Z01419 Encounter for gynecological examination (general) (routine) without abnormal findings: Secondary | ICD-10-CM

## 2019-08-20 DIAGNOSIS — I10 Essential (primary) hypertension: Secondary | ICD-10-CM

## 2019-08-20 MED ORDER — BUSPIRONE HCL 10 MG PO TABS: ORAL_TABLET | ORAL | Status: DC

## 2019-08-20 MED ORDER — LISINOPRIL 10 MG PO TABS: ORAL_TABLET | ORAL | 3 refills | Status: DC

## 2019-08-20 MED ORDER — TRAZODONE HCL 50 MG PO TABS: 50.0000 mg | ORAL_TABLET | Freq: Every evening | ORAL | 1 refills | Status: DC | PRN

## 2019-08-20 MED ORDER — ESCITALOPRAM OXALATE 20 MG PO TABS: ORAL_TABLET | ORAL | 3 refills | Status: DC

## 2019-08-20 MED ORDER — ATORVASTATIN CALCIUM 10 MG PO TABS: 10.0000 mg | ORAL_TABLET | Freq: Every day | ORAL | 3 refills | Status: DC

## 2019-08-20 MED ORDER — TRELEGY ELLIPTA 100-62.5-25 MCG/INH IN AEPB: 1.0000 | INHALATION_SPRAY | Freq: Every day | RESPIRATORY_TRACT | 5 refills | Status: DC

## 2019-08-20 NOTE — Progress Notes (Signed)
Amanda York is a 54 y.o. female presents to office today for annual physical exam examination.    Concerns today include: 1. Hot flashes She reports intermittent hot flashes and mood disturbance.  She wonders if this is menopausal.  She notes that her period has been absent for 15 years after she developed thyroid disorder.  2.  GERD Patient reports that she was seen for abdominal pain and diarrhea.  She was placed on omeprazole which does seem to be helping symptoms but she continues to have some abdominal pain.  Is not reporting nausea, vomiting.  Diarrhea has resolved.  No hematochezia or melena.  Marital status: Married, Substance use: Smoker Diet: Fair Last colonoscopy: Did Cologuard last year but does not recall getting a result. Last mammogram: Is due Last pap smear: Needs  Past Medical History:  Diagnosis Date  . Atypical chest pain 10/20/2014  . Chest pain 10/19/2014  . COPD (chronic obstructive pulmonary disease) ([place])   . Hypertension   . Hypothyroidism   . Tobacco abuse    Social History   Socioeconomic History  . Marital status: Married    Spouse name: Not on file  . Number of children: Not on file  . Years of education: Not on file  . Highest education level: Not on file  Occupational History  . Not on file  Tobacco Use  . Smoking status: Current Every Day Smoker    Packs/day: 0.50    Years: 41.00    Pack years: 20.50    Types: Cigarettes  . Smokeless tobacco: Never Used  . Tobacco comment: Since age 83  Substance and Sexual Activity  . Alcohol use: No    Alcohol/week: 0.0 standard drinks  . Drug use: No  . Sexual activity: Yes  Other Topics Concern  . Not on file  Social History Narrative  . Not on file   Social Determinants of Health   Financial Resource Strain:   . Difficulty of Paying Living Expenses:   Food Insecurity:   . Worried About Charity fundraiser in the Last Year:   . Arboriculturist in the Last Year:   Transportation  Needs:   . Film/video editor (Medical):   Marland Kitchen Lack of Transportation (Non-Medical):   Physical Activity:   . Days of Exercise per Week:   . Minutes of Exercise per Session:   Stress:   . Feeling of Stress :   Social Connections:   . Frequency of Communication with Friends and Family:   . Frequency of Social Gatherings with Friends and Family:   . Attends Religious Services:   . Active Member of Clubs or Organizations:   . Attends Archivist Meetings:   Marland Kitchen Marital Status:   Intimate Partner Violence:   . Fear of Current or Ex-Partner:   . Emotionally Abused:   Marland Kitchen Physically Abused:   . Sexually Abused:    Past Surgical History:  Procedure Laterality Date  . none     Family History  Problem Relation Age of Onset  . CAD Father        Died at 29 of MI  . Coronary artery disease Father   . CAD Paternal Uncle        Died at 4 of MI  . CAD Paternal Grandfather        Details unclaer  . COPD Sister     Current Outpatient Medications:  .  albuterol (PROVENTIL HFA;VENTOLIN HFA) 108 (90 Base)  MCG/ACT inhaler, Inhale 2 puffs into the lungs every 6 (six) hours as needed for wheezing or shortness of breath., Disp: 1 Inhaler, Rfl: 3 .  aspirin EC 81 MG EC tablet, Take 1 tablet (81 mg total) by mouth daily., Disp: 30 tablet, Rfl: 0 .  atorvastatin (LIPITOR) 10 MG tablet, Take 1 tablet (10 mg total) by mouth daily., Disp: 90 tablet, Rfl: 3 .  buPROPion (WELLBUTRIN SR) 150 MG 12 hr tablet, Take 1 tablet (150 mg total) by mouth 2 (two) times daily. (Patient not taking: Reported on 01/06/2019), Disp: 60 tablet, Rfl: 2 .  busPIRone (BUSPAR) 10 MG tablet, TAKE 1 TABLET(10 MG) BY MOUTH THREE TIMES DAILY, Disp: 270 tablet, Rfl: 0 .  cetirizine (ZYRTEC) 10 MG tablet, TAKE 1 TABLET BY MOUTH ONCE DAILY, Disp: 30 tablet, Rfl: 2 .  escitalopram (LEXAPRO) 20 MG tablet, TAKE 1 TABLET(20 MG) BY MOUTH DAILY, Disp: 90 tablet, Rfl: 0 .  Fluticasone-Umeclidin-Vilant (TRELEGY ELLIPTA) 100-62.5-25  MCG/INH AEPB, Inhale 1 puff into the lungs daily., Disp: 60 each, Rfl: 5 .  Fluticasone-Umeclidin-Vilant (TRELEGY ELLIPTA) 100-62.5-25 MCG/INH AEPB, Inhale 1 puff into the lungs daily., Disp: 14 each, Rfl: 0 .  levothyroxine (SYNTHROID) 25 MCG tablet, Take 1 tablet (25 mcg total) by mouth daily before breakfast., Disp: 90 tablet, Rfl: 3 .  lisinopril (ZESTRIL) 10 MG tablet, TAKE 1 TABLET(10 MG) BY MOUTH DAILY.  Needs to be seen for future refills., Disp: 30 tablet, Rfl: 0 .  traZODone (DESYREL) 50 MG tablet, Take 1-1.5 tablets (50-75 mg total) by mouth at bedtime as needed for sleep., Disp: 135 tablet, Rfl: 1  No Known Allergies   ROS: Review of Systems Pertinent items noted in HPI and remainder of comprehensive ROS otherwise negative.    Physical exam BP 138/88   Pulse 84   Temp 98.3 F (36.8 C) (Temporal)   Ht 5' 1" (1.549 m)   Wt 132 lb (59.9 kg)   SpO2 96%   BMI 24.94 kg/m  General appearance: alert, cooperative, appears stated age and no distress Head: Normocephalic, without obvious abnormality, atraumatic Eyes: negative findings: lids and lashes normal, conjunctivae and sclerae normal, corneas clear and pupils equal, round, reactive to light and accomodation Ears: normal TM's and external ear canals both ears Nose: Nares normal. Septum midline. Mucosa normal. No drainage or sinus tenderness. Throat: has artificial uppers. no o/p masses Neck: no adenopathy and supple, symmetrical, trachea midline; mild fullness of thyroid Back: symmetric, no curvature. ROM normal. No CVA tenderness. Lungs: clear to auscultation bilaterally Breasts: normal appearance, no masses or tenderness, Inspection negative, No nipple retraction or dimpling, No nipple discharge or bleeding, No axillary or supraclavicular adenopathy Heart: regular rate and rhythm, S1, S2 normal, no murmur, click, rub or gallop Abdomen: mild epigastric TTP. flat, ND, BS present x4 Pelvic: cervix normal in appearance, external  genitalia normal, no adnexal masses or tenderness, no cervical motion tenderness, rectovaginal septum normal, uterus normal size, shape, and consistency and vagina normal without discharge Extremities: extremities normal, atraumatic, no cyanosis or edema Pulses: 2+ and symmetric Skin: Skin color, texture, turgor normal. No rashes or lesions Lymph nodes: Cervical, supraclavicular, and axillary nodes normal. Neurologic: Alert and oriented X 3, normal strength and tone. Normal symmetric reflexes. Normal coordination and gait Psych: mood stable, speech normal, affect appropriate, pleasant and interactive. Depression screen [facility] 2/9 08/20/2019 12/25/2018 06/18/2018  Decreased Interest 0 0 0  Down, Depressed, Hopeless 0 0 0  PHQ - 2 Score 0 0 0  Altered  sleeping 0 0 0  Tired, decreased energy 0 0 0  Change in appetite 0 0 0  Feeling bad or failure about yourself  0 0 0  Trouble concentrating 0 0 -  Moving slowly or fidgety/restless 0 0 0  Suicidal thoughts 0 0 0  PHQ-9 Score 0 0 0  Difficult doing work/chores - - -  Some recent data might be hidden   GAD 7 : Generalized Anxiety Score 08/20/2019 12/25/2018 06/04/2018 05/21/2018  Nervous, Anxious, on Edge _0 Control/stop worrying 1 0 2 1  Worry too much - different things 1 0 2 1  Trouble relaxing _1 Restless 1 0 2 1  Easily annoyed or irritable _2 Afraid - awful might happen 1 0 2 1  Total GAD 7 Score _3 Anxiety Difficulty - Not difficult at all - Somewhat difficult    Assessment/ Plan: Amanda York here for annual physical exam.   1. Well woman exam with routine gynecological exam  2. Screening for malignant neoplasm of cervix - Pap IG and HPV (high risk) DNA detection  3. Acquired hypothyroidism Has had some hot flashes as well as increased anxiety.  Possibly related to thyroid disorder.  Check TSH Thyroid Panel With TSH  4. Screening for HIV without presence of risk factors - HIV antibody (with  reflex)  5. Hot flashes Uncertain etiology.  Check FSH/Lh - FSH/LH  6. Epigastric abdominal pain Likely secondary to gastritis, particularly since symptoms are improving with PPI.  Okay to increase PPI to twice daily for 7 to 14 days. - CBC with Differential - CMP14+EGFR  7. Generalized anxiety disorder Her anxiety score has gone up some since her last visit but she declined wanting to increase dose further of the BuSpar.  Continue Lexapro - busPIRone (BUSPAR) 10 MG tablet; TAKE 1 TABLET(10 MG) BY MOUTH THREE TIMES DAILY  Dispense: 270 tablet; Refill: 03 - escitalopram (LEXAPRO) 20 MG tablet; TAKE 1 TABLET(20 MG) BY MOUTH DAILY  Dispense: 90 tablet; Refill: 3  8. Major depressive disorder with single episode, in partial remission (HCC) - escitalopram (LEXAPRO) 20 MG tablet; TAKE 1 TABLET(20 MG) BY MOUTH DAILY  Dispense: 90 tablet; Refill: 3  9. Essential hypertension Controlled - lisinopril (ZESTRIL) 10 MG tablet; TAKE 1 TABLET(10 MG) BY MOUTH DAILY.  Dispense: 90 tablet; Refill: 3  10. Colon cancer screening We will see if we can locate the results of Cologuard, which she completed last year.   Counseled on healthy lifestyle choices, including diet (rich in fruits, vegetables and lean meats and low in salt and simple carbohydrates) and exercise (at least 30 minutes of moderate physical activity daily).  Patient to follow up in 1 year for annual exam or sooner if needed.  Herve Haug M. Lajuana Ripple, DO

## 2019-08-20 NOTE — Addendum Note (Signed)
Addended by: Janora Norlander on: 08/20/2019 02:28 PM   Modules accepted: Orders

## 2019-08-20 NOTE — Patient Instructions (Signed)
You had labs performed today.  You will be contacted with the results of the labs once they are available, usually in the next 3 business days for routine lab work.  If you have an active my chart account, they will be released to your MyChart.  If you prefer to have these labs released to you via telephone, please let us know.  If you had a pap smear or biopsy performed, expect to be contacted in about 7-10 days.  Ok to use Prilosec twice daily for 2 weeks to see if this improves your stomach symptoms   Preventive Care 32-68 Years Old, Female Preventive care refers to visits with your health care provider and lifestyle choices that can promote health and wellness. This includes:  A yearly physical exam. This may also be called an annual well check.  Regular dental visits and eye exams.  Immunizations.  Screening for certain conditions.  Healthy lifestyle choices, such as eating a healthy diet, getting regular exercise, not using drugs or products that contain nicotine and tobacco, and limiting alcohol use. What can I expect for my preventive care visit? Physical exam Your health care provider will check your:  Height and weight. This may be used to calculate body mass index (BMI), which tells if you are at a healthy weight.  Heart rate and blood pressure.  Skin for abnormal spots. Counseling Your health care provider may ask you questions about your:  Alcohol, tobacco, and drug use.  Emotional well-being.  Home and relationship well-being.  Sexual activity.  Eating habits.  Work and work Statistician.  Method of birth control.  Menstrual cycle.  Pregnancy history. What immunizations do I need?  Influenza (flu) vaccine  This is recommended every year. Tetanus, diphtheria, and pertussis (Tdap) vaccine  You may need a Td booster every 10 years. Varicella (chickenpox) vaccine  You may need this if you have not been vaccinated. Zoster (shingles) vaccine  You  may need this after age 43. Measles, mumps, and rubella (MMR) vaccine  You may need at least one dose of MMR if you were born in 1957 or later. You may also need a second dose. Pneumococcal conjugate (PCV13) vaccine  You may need this if you have certain conditions and were not previously vaccinated. Pneumococcal polysaccharide (PPSV23) vaccine  You may need one or two doses if you smoke cigarettes or if you have certain conditions. Meningococcal conjugate (MenACWY) vaccine  You may need this if you have certain conditions. Hepatitis A vaccine  You may need this if you have certain conditions or if you travel or work in places where you may be exposed to hepatitis A. Hepatitis B vaccine  You may need this if you have certain conditions or if you travel or work in places where you may be exposed to hepatitis B. Haemophilus influenzae type b (Hib) vaccine  You may need this if you have certain conditions. Human papillomavirus (HPV) vaccine  If recommended by your health care provider, you may need three doses over 6 months. You may receive vaccines as individual doses or as more than one vaccine together in one shot (combination vaccines). Talk with your health care provider about the risks and benefits of combination vaccines. What tests do I need? Blood tests  Lipid and cholesterol levels. These may be checked every 5 years, or more frequently if you are over 33 years old.  Hepatitis C test.  Hepatitis B test. Screening  Lung cancer screening. You may have this screening  every year starting at age 19 if you have a 30-pack-year history of smoking and currently smoke or have quit within the past 15 years.  Colorectal cancer screening. All adults should have this screening starting at age 81 and continuing until age 42. Your health care provider may recommend screening at age 8 if you are at increased risk. You will have tests every 1-10 years, depending on your results and the  type of screening test.  Diabetes screening. This is done by checking your blood sugar (glucose) after you have not eaten for a while (fasting). You may have this done every 1-3 years.  Mammogram. This may be done every 1-2 years. Talk with your health care provider about when you should start having regular mammograms. This may depend on whether you have a family history of breast cancer.  BRCA-related cancer screening. This may be done if you have a family history of breast, ovarian, tubal, or peritoneal cancers.  Pelvic exam and Pap test. This may be done every 3 years starting at age 18. Starting at age 87, this may be done every 5 years if you have a Pap test in combination with an HPV test. Other tests  Sexually transmitted disease (STD) testing.  Bone density scan. This is done to screen for osteoporosis. You may have this scan if you are at high risk for osteoporosis. Follow these instructions at home: Eating and drinking  Eat a diet that includes fresh fruits and vegetables, whole grains, lean protein, and low-fat dairy.  Take vitamin and mineral supplements as recommended by your health care provider.  Do not drink alcohol if: ? Your health care provider tells you not to drink. ? You are pregnant, may be pregnant, or are planning to become pregnant.  If you drink alcohol: ? Limit how much you have to 0-1 drink a day. ? Be aware of how much alcohol is in your drink. In the [place] one drink equals one 12 oz bottle of beer (355 mL), one 5 oz glass of wine (148 mL), or one 1 oz glass of hard liquor (44 mL). Lifestyle  Take daily care of your teeth and gums.  Stay active. Exercise for at least 30 minutes on 5 or more days each week.  Do not use any products that contain nicotine or tobacco, such as cigarettes, e-cigarettes, and chewing tobacco. If you need help quitting, ask your health care provider.  If you are sexually active, practice safe sex. Use a condom or other form  of birth control (contraception) in order to prevent pregnancy and STIs (sexually transmitted infections).  If told by your health care provider, take low-dose aspirin daily starting at age 75. What's next?  Visit your health care provider once a year for a well check visit.  Ask your health care provider how often you should have your eyes and teeth checked.  Stay up to date on all vaccines. This information is not intended to replace advice given to you by your health care provider. Make sure you discuss any questions you have with your health care provider. Document Revised: 11/29/2017 Document Reviewed: 11/29/2017 Elsevier Patient Education  2020 [place] for Gastroesophageal Reflux Disease, Adult When you have gastroesophageal reflux disease (GERD), the foods you eat and your eating habits are very important. Choosing the right foods can help ease your discomfort. Think about working with a nutrition specialist (dietitian) to help you make good choices. What are tips for following this plan?  Meals  Choose healthy foods that are low in fat, such as fruits, vegetables, whole grains, low-fat dairy products, and lean meat, fish, and poultry.  Eat small meals often instead of 3 large meals a day. Eat your meals slowly, and in a place where you are relaxed. Avoid bending over or lying down until 2-3 hours after eating.  Avoid eating meals 2-3 hours before bed.  Avoid drinking a lot of liquid with meals.  Cook foods using methods other than frying. Bake, grill, or broil food instead.  Avoid or limit: ? Chocolate. ? Peppermint or spearmint. ? Alcohol. ? Pepper. ? Black and decaffeinated coffee. ? Black and decaffeinated tea. ? Bubbly (carbonated) soft drinks. ? Caffeinated energy drinks and soft drinks.  Limit high-fat foods such as: ? Fatty meat or fried foods. ? Whole milk, cream, butter, or ice cream. ? Nuts and nut butters. ? Pastries, donuts, and  sweets made with butter or shortening.  Avoid foods that cause symptoms. These foods may be different for everyone. Common foods that cause symptoms include: ? Tomatoes. ? Oranges, lemons, and limes. ? Peppers. ? Spicy food. ? Onions and garlic. ? Vinegar. Lifestyle  Maintain a healthy weight. Ask your doctor what weight is healthy for you. If you need to lose weight, work with your doctor to do so safely.  Exercise for at least 30 minutes for 5 or more days each week, or as told by your doctor.  Wear loose-fitting clothes.  Do not smoke. If you need help quitting, ask your doctor.  Sleep with the head of your bed higher than your feet. Use a wedge under the mattress or blocks under the bed frame to raise the head of the bed. Summary  When you have gastroesophageal reflux disease (GERD), food and lifestyle choices are very important in easing your symptoms.  Eat small meals often instead of 3 large meals a day. Eat your meals slowly, and in a place where you are relaxed.  Limit high-fat foods such as fatty meat or fried foods.  Avoid bending over or lying down until 2-3 hours after eating.  Avoid peppermint and spearmint, caffeine, alcohol, and chocolate. This information is not intended to replace advice given to you by your health care provider. Make sure you discuss any questions you have with your health care provider. Document Revised: 07/11/2018 Document Reviewed: 04/25/2016 Elsevier Patient Education  [place].

## 2019-08-21 LAB — THYROID PANEL WITH TSH
Free Thyroxine Index: 1.8 (ref 1.2–4.9)
T3 Uptake Ratio: 22 % — ABNORMAL LOW (ref 24–39)
T4, Total: 8.2 ug/dL (ref 4.5–12.0)
TSH: 1.84 u[IU]/mL (ref 0.450–4.500)

## 2019-08-21 LAB — CBC WITH DIFFERENTIAL/PLATELET
Basophils Absolute: 0.1 10*3/uL (ref 0.0–0.2)
Basos: 1 %
EOS (ABSOLUTE): 0.1 10*3/uL (ref 0.0–0.4)
Eos: 1 %
Hematocrit: 46.5 % (ref 34.0–46.6)
Hemoglobin: 15.9 g/dL (ref 11.1–15.9)
Immature Grans (Abs): 0 10*3/uL (ref 0.0–0.1)
Immature Granulocytes: 0 %
Lymphocytes Absolute: 2.1 10*3/uL (ref 0.7–3.1)
Lymphs: 35 %
MCH: 30.3 pg (ref 26.6–33.0)
MCHC: 34.2 g/dL (ref 31.5–35.7)
MCV: 89 fL (ref 79–97)
Monocytes Absolute: 0.4 10*3/uL (ref 0.1–0.9)
Monocytes: 7 %
Neutrophils Absolute: 3.3 10*3/uL (ref 1.4–7.0)
Neutrophils: 56 %
Platelets: 230 10*3/uL (ref 150–450)
RBC: 5.25 x10E6/uL (ref 3.77–5.28)
RDW: 13.7 % (ref 11.7–15.4)
WBC: 6 10*3/uL (ref 3.4–10.8)

## 2019-08-21 LAB — CMP14+EGFR
ALT: 8 IU/L (ref 0–32)
AST: 14 IU/L (ref 0–40)
Albumin/Globulin Ratio: 2.2 (ref 1.2–2.2)
Albumin: 4.3 g/dL (ref 3.8–4.9)
Alkaline Phosphatase: 66 IU/L (ref 48–121)
BUN/Creatinine Ratio: 9 (ref 9–23)
BUN: 6 mg/dL (ref 6–24)
Bilirubin Total: 0.4 mg/dL (ref 0.0–1.2)
CO2: 22 mmol/L (ref 20–29)
Calcium: 9.2 mg/dL (ref 8.7–10.2)
Chloride: 98 mmol/L (ref 96–106)
Creatinine, Ser: 0.66 mg/dL (ref 0.57–1.00)
GFR calc Af Amer: 115 mL/min/{1.73_m2} (ref >59)
GFR calc non Af Amer: 100 mL/min/{1.73_m2} (ref >59)
Globulin, Total: 2 g/dL (ref 1.5–4.5)
Glucose: 91 mg/dL (ref 65–99)
Potassium: 4.3 mmol/L (ref 3.5–5.2)
Sodium: 136 mmol/L (ref 134–144)
Total Protein: 6.3 g/dL (ref 6.0–8.5)

## 2019-08-21 LAB — HIV ANTIBODY (ROUTINE TESTING W REFLEX): HIV Screen 4th Generation wRfx: NONREACTIVE

## 2019-08-21 LAB — FSH/LH
FSH: 71.5 m[IU]/mL
LH: 40.1 m[IU]/mL

## 2019-08-23 LAB — IGP, APTIMA HPV: HPV Aptima: NEGATIVE

## 2019-10-02 ENCOUNTER — Other Ambulatory Visit: Payer: Self-pay | Admitting: Family Medicine

## 2019-10-02 DIAGNOSIS — Z1231 Encounter for screening mammogram for malignant neoplasm of breast: Secondary | ICD-10-CM

## 2019-11-03 ENCOUNTER — Other Ambulatory Visit: Payer: Self-pay | Admitting: Family Medicine

## 2019-11-03 DIAGNOSIS — F324 Major depressive disorder, single episode, in partial remission: Secondary | ICD-10-CM

## 2019-11-03 DIAGNOSIS — F411 Generalized anxiety disorder: Secondary | ICD-10-CM

## 2020-01-26 ENCOUNTER — Other Ambulatory Visit: Payer: Self-pay | Admitting: Family Medicine

## 2020-01-26 DIAGNOSIS — E039 Hypothyroidism, unspecified: Secondary | ICD-10-CM

## 2020-03-09 ENCOUNTER — Other Ambulatory Visit: Payer: Self-pay | Admitting: Family Medicine

## 2020-08-04 ENCOUNTER — Encounter: Payer: Self-pay | Admitting: Family Medicine

## 2020-08-04 ENCOUNTER — Ambulatory Visit: Payer: BC Managed Care – PPO | Admitting: Family Medicine

## 2020-08-04 ENCOUNTER — Other Ambulatory Visit: Payer: Self-pay

## 2020-08-04 VITALS — BP 138/85 | HR 85 | Temp 97.6°F | Ht 61.0 in | Wt 126.4 lb

## 2020-08-04 DIAGNOSIS — K644 Residual hemorrhoidal skin tags: Secondary | ICD-10-CM | POA: Diagnosis not present

## 2020-08-04 NOTE — Patient Instructions (Signed)

## 2020-08-04 NOTE — Progress Notes (Addendum)
Acute Office Visit  Subjective:    Patient ID: Amanda York, female    DOB: 1965/04/04, 55 y.o.   MRN: [id number]  Chief Complaint  Patient presents with  . Cyst    HPI Patient is in today for a lump on her rectum for 2 weeks. It is not painful, itchy, or bothersome. She denies bleeding, drainage or fever. No change in size. She did try preparation H without improvement. Denies issues with constipation and denies having to strain. Denies changes in bowel habits, blood in stool, vomiting, or weigh loss. Last BM was yesterday. She has had a negative Cologuard. She has a CPE scheduled next month with her PCP.   Past Medical History:  Diagnosis Date  . Atypical chest pain 10/20/2014  . Chest pain 10/19/2014  . COPD (chronic obstructive pulmonary disease) ([place])   . Hypertension   . Hypothyroidism   . Tobacco abuse     Past Surgical History:  Procedure Laterality Date  . none      Family History  Problem Relation Age of Onset  . CAD Father        Died at 54 of [place]  . Coronary artery disease Father   . CAD Paternal Uncle        Died at 69 of [place]  . CAD Paternal Grandfather        Details unclaer  . COPD Sister     Social History   Socioeconomic History  . Marital status: Married    Spouse name: Not on file  . Number of children: Not on file  . Years of education: Not on file  . Highest education level: Not on file  Occupational History  . Not on file  Tobacco Use  . Smoking status: Current Every Day Smoker    Packs/day: 0.50    Years: 41.00    Pack years: 20.50    Types: Cigarettes  . Smokeless tobacco: Never Used  . Tobacco comment: Since age 45  Vaping Use  . Vaping Use: Never used  Substance and Sexual Activity  . Alcohol use: No    Alcohol/week: 0.0 standard drinks  . Drug use: No  . Sexual activity: Yes  Other Topics Concern  . Not on file  Social History Narrative  . Not on file   Social Determinants of Health   Financial Resource Strain: Not  on file  Food Insecurity: Not on file  Transportation Needs: Not on file  Physical Activity: Not on file  Stress: Not on file  Social Connections: Not on file  Intimate Partner Violence: Not on file    Outpatient Medications Prior to Visit  Medication Sig Dispense Refill  . albuterol (PROVENTIL HFA;VENTOLIN HFA) 108 (90 Base) MCG/ACT inhaler Inhale 2 puffs into the lungs every 6 (six) hours as needed for wheezing or shortness of breath. 1 Inhaler 3  . aspirin EC 81 MG EC tablet Take 1 tablet (81 mg total) by mouth daily. 30 tablet 0  . busPIRone (BUSPAR) 10 MG tablet TAKE 1 TABLET(10 MG) BY MOUTH THREE TIMES DAILY 270 tablet 03  . cetirizine (ZYRTEC) 10 MG tablet TAKE 1 TABLET BY MOUTH ONCE DAILY 30 tablet 2  . escitalopram (LEXAPRO) 20 MG tablet TAKE 1 TABLET(20 MG) BY MOUTH DAILY 90 tablet 3  . Fluticasone-Umeclidin-Vilant (TRELEGY ELLIPTA) 100-62.5-25 MCG/INH AEPB Inhale 1 puff into the lungs daily. 60 each 5  . levothyroxine (SYNTHROID) 25 MCG tablet Take 1 tablet (25 mcg total) by mouth daily  before breakfast. (Needs to be seen before next refill) 30 tablet 0  . lisinopril (ZESTRIL) 10 MG tablet TAKE 1 TABLET(10 MG) BY MOUTH DAILY. 90 tablet 3  . omeprazole (PRILOSEC OTC) 20 MG tablet Take by mouth.    . traZODone (DESYREL) 50 MG tablet TAKE 1 TO 1 AND 1/2 TABLETS(50 TO 75 MG) BY MOUTH AT BEDTIME AS NEEDED FOR SLEEP 135 tablet 1  . atorvastatin (LIPITOR) 10 MG tablet Take 1 tablet (10 mg total) by mouth daily. 90 tablet 3   No facility-administered medications prior to visit.    No Known Allergies  Review of Systems As per HPI.     Objective:    Physical Exam Vitals and nursing note reviewed.  Constitutional:      General: She is not in acute distress.    Appearance: She is not ill-appearing, toxic-appearing or diaphoretic.  Genitourinary:    Rectum: External hemorrhoid (nontender) present. No anal fissure.  Musculoskeletal:     Right lower leg: No edema.     Left lower  leg: No edema.  Skin:    General: Skin is warm and dry.  Neurological:     General: No focal deficit present.     Mental Status: She is alert and oriented to person, place, and time.  Psychiatric:        Mood and Affect: Mood normal.        Behavior: Behavior normal.     BP 138/85   Pulse 85   Temp 97.6 F (36.4 C) (Temporal)   Ht 5\' 1"  (1.549 m)   Wt 126 lb 6 oz (57.3 kg)   BMI 23.88 kg/m  Wt Readings from Last 3 Encounters:  08/04/20 126 lb 6 oz (57.3 kg)  08/20/19 132 lb (59.9 kg)  01/06/19 126 lb (57.2 kg)    Health Maintenance Due  Topic Date Due  . Hepatitis C Screening  Never done  . Fecal DNA (Cologuard)  Never done  . MAMMOGRAM  10/04/2018  . COVID-19 Vaccine (3 - Booster for Pfizer series) 05/23/2020    There are no preventive care reminders to display for this patient.   Lab Results  Component Value Date   TSH 1.840 08/20/2019   Lab Results  Component Value Date   WBC 6.0 08/20/2019   HGB 15.9 08/20/2019   HCT 46.5 08/20/2019   MCV 89 08/20/2019   PLT 230 08/20/2019   Lab Results  Component Value Date   NA 136 08/20/2019   K 4.3 08/20/2019   CO2 22 08/20/2019   GLUCOSE 91 08/20/2019   BUN 6 08/20/2019   CREATININE 0.66 08/20/2019   BILITOT 0.4 08/20/2019   ALKPHOS 66 08/20/2019   AST 14 08/20/2019   ALT 8 08/20/2019   PROT 6.3 08/20/2019   ALBUMIN 4.3 08/20/2019   CALCIUM 9.2 08/20/2019   ANIONGAP 9 10/19/2014   Lab Results  Component Value Date   CHOL 253 (H) 12/25/2018   Lab Results  Component Value Date   HDL 77 12/25/2018   Lab Results  Component Value Date   LDLCALC 160 (H) 12/25/2018   Lab Results  Component Value Date   TRIG 94 12/25/2018   Lab Results  Component Value Date   CHOLHDL 3.3 12/25/2018   Lab Results  Component Value Date   HGBA1C 5.7 (H) 10/20/2014       Assessment & Plan:   Amanda York was seen today for cyst.  Diagnoses and all orders for this visit:  External  hemorrhoid Asymptomatic.  Discontinue preparation H for now as she is asymptomatic, can use as needed for itching, burning, or pain. Return to office for new or worsening symptoms.    Keep scheduled appointment with PCP.  The patient indicates understanding of these issues and agrees with the plan.   Amanda Perking, FNP

## 2020-09-05 ENCOUNTER — Other Ambulatory Visit: Payer: Self-pay | Admitting: Family Medicine

## 2020-09-05 DIAGNOSIS — F324 Major depressive disorder, single episode, in partial remission: Secondary | ICD-10-CM

## 2020-09-05 DIAGNOSIS — I10 Essential (primary) hypertension: Secondary | ICD-10-CM

## 2020-09-05 DIAGNOSIS — F411 Generalized anxiety disorder: Secondary | ICD-10-CM

## 2020-09-14 ENCOUNTER — Other Ambulatory Visit: Payer: Self-pay

## 2020-09-14 ENCOUNTER — Ambulatory Visit (INDEPENDENT_AMBULATORY_CARE_PROVIDER_SITE_OTHER): Payer: BC Managed Care – PPO

## 2020-09-14 ENCOUNTER — Encounter: Payer: Self-pay | Admitting: Family Medicine

## 2020-09-14 ENCOUNTER — Ambulatory Visit: Payer: BC Managed Care – PPO | Admitting: Family Medicine

## 2020-09-14 VITALS — BP 128/84 | HR 77 | Temp 97.9°F | Ht 61.0 in | Wt 125.2 lb

## 2020-09-14 DIAGNOSIS — J449 Chronic obstructive pulmonary disease, unspecified: Secondary | ICD-10-CM

## 2020-09-14 DIAGNOSIS — Z720 Tobacco use: Secondary | ICD-10-CM | POA: Diagnosis not present

## 2020-09-14 DIAGNOSIS — Z780 Asymptomatic menopausal state: Secondary | ICD-10-CM | POA: Diagnosis not present

## 2020-09-14 DIAGNOSIS — Z0001 Encounter for general adult medical examination with abnormal findings: Secondary | ICD-10-CM | POA: Diagnosis not present

## 2020-09-14 DIAGNOSIS — F324 Major depressive disorder, single episode, in partial remission: Secondary | ICD-10-CM

## 2020-09-14 DIAGNOSIS — E7800 Pure hypercholesterolemia, unspecified: Secondary | ICD-10-CM

## 2020-09-14 DIAGNOSIS — F411 Generalized anxiety disorder: Secondary | ICD-10-CM

## 2020-09-14 DIAGNOSIS — J301 Allergic rhinitis due to pollen: Secondary | ICD-10-CM

## 2020-09-14 DIAGNOSIS — I10 Essential (primary) hypertension: Secondary | ICD-10-CM

## 2020-09-14 DIAGNOSIS — E039 Hypothyroidism, unspecified: Secondary | ICD-10-CM

## 2020-09-14 DIAGNOSIS — Z0000 Encounter for general adult medical examination without abnormal findings: Secondary | ICD-10-CM

## 2020-09-14 DIAGNOSIS — Z1211 Encounter for screening for malignant neoplasm of colon: Secondary | ICD-10-CM

## 2020-09-14 MED ORDER — TRELEGY ELLIPTA 100-62.5-25 MCG/INH IN AEPB: 1.0000 | INHALATION_SPRAY | Freq: Every day | RESPIRATORY_TRACT | 12 refills | Status: DC

## 2020-09-14 MED ORDER — ESCITALOPRAM OXALATE 20 MG PO TABS: 20.0000 mg | ORAL_TABLET | Freq: Every day | ORAL | 3 refills | Status: DC

## 2020-09-14 MED ORDER — CETIRIZINE HCL 10 MG PO TABS: 10.0000 mg | ORAL_TABLET | Freq: Every day | ORAL | 2 refills | Status: DC

## 2020-09-14 MED ORDER — ALBUTEROL SULFATE HFA 108 (90 BASE) MCG/ACT IN AERS: 2.0000 | INHALATION_SPRAY | Freq: Four times a day (QID) | RESPIRATORY_TRACT | 3 refills | Status: DC | PRN

## 2020-09-14 MED ORDER — BUSPIRONE HCL 10 MG PO TABS
ORAL_TABLET | ORAL | Status: DC
Start: 2020-09-14 — End: 2022-01-09

## 2020-09-14 MED ORDER — TRAZODONE HCL 50 MG PO TABS: ORAL_TABLET | ORAL | 3 refills | Status: DC

## 2020-09-14 MED ORDER — LISINOPRIL 10 MG PO TABS: 10.0000 mg | ORAL_TABLET | Freq: Every day | ORAL | 3 refills | Status: DC

## 2020-09-14 NOTE — Patient Instructions (Signed)
You had labs performed today.  You will be contacted with the results of the labs once they are available, usually in the next 3 business days for routine lab work.  If you have an active my chart account, they will be released to your MyChart.  If you prefer to have these labs released to you via telephone, please let us know.  If you had a pap smear or biopsy performed, expect to be contacted in about 7-10 days.  Smoking Tobacco Information, Adult Smoking tobacco can be harmful to your health. Tobacco contains a poisonous (toxic), colorless chemical called nicotine. Nicotine is addictive. It changes the brain and can make it hard to stop smoking. Tobacco also has other toxicchemicals that can hurt your body and raise your risk of many cancers. How can smoking tobacco affect me? Smoking tobacco puts you at risk for: Cancer. Smoking is most commonly associated with lung cancer, but can also lead to cancer in other parts of the body. Chronic obstructive pulmonary disease (COPD). This is a long-term lung condition that makes it hard to breathe. It also gets worse over time. High blood pressure (hypertension), heart disease, stroke, or heart attack. Lung infections, such as pneumonia. Cataracts. This is when the lenses in the eyes become clouded. Digestive problems. This may include peptic ulcers, heartburn, and gastroesophageal reflux disease (GERD). Oral health problems, such as gum disease and tooth loss. Loss of taste and smell. Smoking can affect your appearance by causing: Wrinkles. Yellow or stained teeth, fingers, and fingernails. Smoking tobacco can also affect your social life, because: It may be challenging to find places to smoke when away from home. Many workplaces, [employer], hotels, and public places are tobacco-free. Smoking is expensive. This is due to the cost of tobacco and the long-term costs of treating health problems from smoking. Secondhand smoke may affect those around  you. Secondhand smoke can cause lung cancer, breathing problems, and heart disease. Children of smokers have a higher risk for: Sudden infant death syndrome (SIDS). Ear infections. Lung infections. If you currently smoke tobacco, quitting now can help you: Lead a longer and healthier life. Look, smell, breathe, and feel better over time. Save money. Protect others from the harms of secondhand smoke. What actions can I take to prevent health problems? Quit smoking  Do not start smoking. Quit if you already do. Make a plan to quit smoking and commit to it. Look for programs to help you and ask your health care provider for recommendations and ideas. Set a date and write down all the reasons you want to quit. Let your friends and family know you are quitting so they can help and support you. Consider finding friends who also want to quit. It can be easier to quit with someone else, so that you can support each other. Talk with your health care provider about using nicotine replacement medicines to help you quit, such as gum, lozenges, patches, sprays, or pills. Do not replace cigarette smoking with electronic cigarettes, which are commonly called e-cigarettes. The safety of e-cigarettes is not known, and some may contain harmful chemicals. If you try to quit but return to smoking, stay positive. It is common to slip up when you first quit, so take it one day at a time. Be prepared for cravings. When you feel the urge to smoke, chew gum or suck on hard candy.  Lifestyle Stay busy and take care of your body. Drink enough fluid to keep your urine pale yellow. Get  plenty of exercise and eat a healthy diet. This can help prevent weight gain after quitting. Monitor your eating habits. Quitting smoking can cause you to have a larger appetite than when you smoke. Find ways to relax. Go out with friends or family to a movie or a restaurant where people do not smoke. Ask your health care provider about  having regular tests (screenings) to check for cancer. This may include blood tests, imaging tests, and other tests. Find ways to manage your stress, such as meditation, yoga, or exercise. Where to find support To get support to quit smoking, consider: Asking your health care provider for more information and resources. Taking classes to learn more about quitting smoking. Looking for local organizations that offer resources about quitting smoking. Joining a support group for people who want to quit smoking in your local community. Calling the smokefree.gov counselor helpline: 1-800-Quit-Now 352 442 0982) Where to find more information You may find more information about quitting smoking from: HelpGuide.org: www.helpguide.org https://hall.com/: smokefree.gov American Lung Association: www.lung.org Contact a health care provider if you: Have problems breathing. Notice that your lips, nose, or fingers turn blue. Have chest pain. Are coughing up blood. Feel faint or you pass out. Have other health changes that cause you to worry. Summary Smoking tobacco can negatively affect your health, the health of those around you, your finances, and your social life. Do not start smoking. Quit if you already do. If you need help quitting, ask your health care provider. Think about joining a support group for people who want to quit smoking in your local community. There are many effective programs that will help you to quit this behavior. This information is not intended to replace advice given to you by your health care provider. Make sure you discuss any questions you have with your healthcare provider. Document Revised: 02/10/2020 Document Reviewed: 02/10/2020 Elsevier Patient Education  2022 [employer].

## 2020-09-14 NOTE — Progress Notes (Signed)
Amanda York is a 55 y.o. female presents to office today for annual physical exam examination.    Concerns today include: 1.  Hypertension with hyperlipidemia with tobacco use disorder Patient is compliant with lisinopril 10 mg daily.  She does continue to smoke daily about 1 pack/day.  Denies any chest pain, shortness of breath, visual disturbance, headache, symptoms of claudication in the periphery.  2.  Depression and anxiety Patient reports excellent control with BuSpar, Lexapro and trazodone.  No signs or symptoms of serotonin syndrome.  She sleeps well and has been doing relatively well on this combination of medicine.  3.  Hypothyroidism Patient is compliant with Synthroid 25 mcg daily.  No change in voice, no tremor, no change in bowel habit.  No heart palpitations.  4.  COPD Patient is compliant with Trelegy.  She needs renewal on Trelegy and her albuterol inhaler.  She continues to smoke as above.  She feels that her symptoms are well controlled on current dose of Trelegy.  Diet: Has recently increased fiber due to hemorrhoid, Exercise: Stays physically active Last eye exam: Up-to-date Last dental exam: N/A Last colonoscopy: Has not returned Cologuard but would like a new prescription Last mammogram: Scheduled Last pap smear: Up-to-date Refills needed today: All Immunizations needed:  Immunization History  Administered Date(s) Administered   PFIZER(Purple Top)SARS-COV-2 Vaccination 10/31/2019, 11/21/2019   Tdap 05/21/2018   Zoster Recombinat (Shingrix) 05/21/2018, 12/25/2018    Past Medical History:  Diagnosis Date   Atypical chest pain 10/20/2014   Chest pain 10/19/2014   COPD (chronic obstructive pulmonary disease) ([place])    Hypertension    Hypothyroidism    Tobacco abuse    Social History   Socioeconomic History   Marital status: Married    Spouse name: Not on file   Number of children: Not on file   Years of education: Not on file   Highest education  level: Not on file  Occupational History   Not on file  Tobacco Use   Smoking status: Every Day    Packs/day: 0.50    Years: 41.00    Pack years: 20.50    Types: Cigarettes   Smokeless tobacco: Never   Tobacco comments:    Since age 58  Vaping Use   Vaping Use: Never used  Substance and Sexual Activity   Alcohol use: No    Alcohol/week: 0.0 standard drinks   Drug use: No   Sexual activity: Yes  Other Topics Concern   Not on file  Social History Narrative   Not on file   Social Determinants of Health   Financial Resource Strain: Not on file  Food Insecurity: Not on file  Transportation Needs: Not on file  Physical Activity: Not on file  Stress: Not on file  Social Connections: Not on file  Intimate Partner Violence: Not on file   Past Surgical History:  Procedure Laterality Date   none     Family History  Problem Relation Age of Onset   CAD Father        Died at 73 of [place]   Coronary artery disease Father    CAD Paternal Uncle        Died at 65 of [place]   CAD Paternal Grandfather        Details unclaer   COPD Sister     Current Outpatient Medications:    albuterol (PROVENTIL HFA;VENTOLIN HFA) 108 (90 Base) MCG/ACT inhaler, Inhale 2 puffs into the lungs every 6 (six)  hours as needed for wheezing or shortness of breath., Disp: 1 Inhaler, Rfl: 3   busPIRone (BUSPAR) 10 MG tablet, TAKE 1 TABLET(10 MG) BY MOUTH THREE TIMES DAILY, Disp: 270 tablet, Rfl: 03   cetirizine (ZYRTEC) 10 MG tablet, TAKE 1 TABLET BY MOUTH ONCE DAILY, Disp: 30 tablet, Rfl: 2   escitalopram (LEXAPRO) 20 MG tablet, TAKE 1 TABLET(20 MG) BY MOUTH DAILY, Disp: 90 tablet, Rfl: 0   Fluticasone-Umeclidin-Vilant (TRELEGY ELLIPTA) 100-62.5-25 MCG/INH AEPB, Inhale 1 puff into the lungs daily., Disp: 60 each, Rfl: 5   levothyroxine (SYNTHROID) 25 MCG tablet, Take 1 tablet (25 mcg total) by mouth daily before breakfast. (Needs to be seen before next refill), Disp: 30 tablet, Rfl: 0   lisinopril (ZESTRIL) 10 MG  tablet, TAKE 1 TABLET(10 MG) BY MOUTH DAILY, Disp: 90 tablet, Rfl: 0   traZODone (DESYREL) 50 MG tablet, TAKE 1 TO 1 AND 1/2 TABLETS(50 TO 75 MG) BY MOUTH AT BEDTIME AS NEEDED FOR SLEEP, Disp: 135 tablet, Rfl: 0   aspirin EC 81 MG EC tablet, Take 1 tablet (81 mg total) by mouth daily. (Patient not taking: Reported on 09/14/2020), Disp: 30 tablet, Rfl: 0  No Known Allergies   ROS: Review of Systems Pertinent items noted in HPI and remainder of comprehensive ROS otherwise negative.    Physical exam BP 128/84   Pulse 77   Temp 97.9 F (36.6 C)   Ht 5' 1" (1.549 m)   Wt 125 lb 3.2 oz (56.8 kg)   SpO2 93%   BMI 23.66 kg/m  General appearance: alert, cooperative, appears stated age, and no distress Head: Normocephalic, without obvious abnormality, atraumatic Eyes: conjunctivae/corneas clear. PERRL, EOM's intact. Fundi benign. Ears: normal TM's and external ear canals both ears Nose: Nares normal. Septum midline. Mucosa normal. No drainage or sinus tenderness. Throat:  false upper.  Oropharynx without masses.  No sublingual masses Neck: no adenopathy, no carotid bruit, supple, symmetrical, trachea midline, and thyroid not enlarged, symmetric, no tenderness/mass/nodules Back: symmetric, no curvature. ROM normal. No CVA tenderness. Lungs:  Good air movement.  She has coarse breath sounds bibasilarly.  Normal work of breathing on room air Heart: regular rate and rhythm, S1, S2 normal, no murmur, click, rub or gallop Abdomen: soft, non-tender; bowel sounds normal; no masses,  no organomegaly Extremities: extremities normal, atraumatic, no cyanosis or edema Pulses: 2+ and symmetric Skin: Skin color, texture, turgor normal. No rashes or lesions Lymph nodes: Cervical, supraclavicular, and axillary nodes normal. Neurologic: Alert and oriented X 3, normal strength and tone. Normal symmetric reflexes. Normal coordination and gait Psych: Mood stable, speech normal, affect appropriate.  Patient is  pleasant and interactive. Depression screen [facility] 2/9 09/14/2020 08/04/2020 08/20/2019  Decreased Interest 0 0 0  Down, Depressed, Hopeless 0 0 0  PHQ - 2 Score 0 0 0  Altered sleeping 0 0 0  Tired, decreased energy 0 0 0  Change in appetite 0 0 0  Feeling bad or failure about yourself  0 0 0  Trouble concentrating 0 0 0  Moving slowly or fidgety/restless 0 0 0  Suicidal thoughts 0 0 0  PHQ-9 Score 0 0 0  Difficult doing work/chores Not difficult at all Not difficult at all -  Some recent data might be hidden   GAD 7 : Generalized Anxiety Score 09/14/2020 08/20/2019 12/25/2018 06/04/2018  Nervous, Anxious, on Edge 0 _0 Control/stop worrying 0 1 0 2  Worry too much - different things 0 1 0 2  Trouble relaxing 0 1 1 2  Restless 0 1 0 2  Easily annoyed or irritable 0 2 1 2  Afraid - awful might happen 0 1 0 2  Total GAD 7 Score 0 8 3 14  Anxiety Difficulty Not difficult at all - Not difficult at all -   Assessment/ Plan: Diani S Telford here for annual physical exam.   Annual physical exam  Essential hypertension - Plan: CMP14+EGFR, Lipid Panel, lisinopril (ZESTRIL) 10 MG tablet  Pure hypercholesterolemia - Plan: CMP14+EGFR, Lipid Panel  Tobacco use - Plan: CBC, Fluticasone-Umeclidin-Vilant (TRELEGY ELLIPTA) 100-62.5-25 MCG/INH AEPB  Chronic obstructive pulmonary disease, unspecified COPD type (HCC) - Plan: Fluticasone-Umeclidin-Vilant (TRELEGY ELLIPTA) 100-62.5-25 MCG/INH AEPB, albuterol (VENTOLIN HFA) 108 (90 Base) MCG/ACT inhaler  Acquired hypothyroidism - Plan: TSH, T4, Free  Generalized anxiety disorder - Plan: busPIRone (BUSPAR) 10 MG tablet, escitalopram (LEXAPRO) 20 MG tablet  Major depressive disorder with single episode, in partial remission (HCC) - Plan: escitalopram (LEXAPRO) 20 MG tablet  Postmenopausal - Plan: DG [facility] DEXA  Seasonal allergic rhinitis due to pollen - Plan: cetirizine (ZYRTEC) 10 MG tablet  Colon cancer screening - Plan: Cologuard  Blood pressure  well controlled.  Continue current regimen.  Check CMP, fasting lipid panel  Not currently treated with cholesterol medication.  Check fasting lipid panel  Asymptomatic from a thyroid standpoint.  Check TSH, free T4.  Will renew Synthroid pending these results tomorrow  Anxiety and depression are under excellent control with BuSpar and Lexapro.  These have been renewed along with her trazodone for sleep.  No signs or symptoms suggestive of serotonin syndrome  Will check DEXA scan.  No previous for comparison.  Allergic rhinitis well-controlled with Zyrtec.  This been renewed  Cologuard reordered.  She will return this ASAP  Counseled on tobacco cessation.  She will continue using the Trelegy.  2 boxes of samples provided and her medication was renewed.   Counseled on healthy lifestyle choices, including diet (rich in fruits, vegetables and lean meats and low in salt and simple carbohydrates) and exercise (at least 30 minutes of moderate physical activity daily).  Patient to follow up in 1 year for annual exam or sooner if needed.  Ashly M. Gottschalk, DO      

## 2020-09-15 ENCOUNTER — Other Ambulatory Visit: Payer: Self-pay | Admitting: Family Medicine

## 2020-09-15 DIAGNOSIS — E7800 Pure hypercholesterolemia, unspecified: Secondary | ICD-10-CM

## 2020-09-15 DIAGNOSIS — E039 Hypothyroidism, unspecified: Secondary | ICD-10-CM

## 2020-09-15 LAB — T4, FREE: Free T4: 1.04 ng/dL (ref 0.82–1.77)

## 2020-09-15 LAB — CBC
Hematocrit: 48 % — ABNORMAL HIGH (ref 34.0–46.6)
Hemoglobin: 16.3 g/dL — ABNORMAL HIGH (ref 11.1–15.9)
MCH: 29.9 pg (ref 26.6–33.0)
MCHC: 34 g/dL (ref 31.5–35.7)
MCV: 88 fL (ref 79–97)
Platelets: 235 10*3/uL (ref 150–450)
RBC: 5.46 x10E6/uL — ABNORMAL HIGH (ref 3.77–5.28)
RDW: 13.1 % (ref 11.7–15.4)
WBC: 5.9 10*3/uL (ref 3.4–10.8)

## 2020-09-15 LAB — CMP14+EGFR
ALT: 10 IU/L (ref 0–32)
AST: 14 IU/L (ref 0–40)
Albumin/Globulin Ratio: 1.7 (ref 1.2–2.2)
Albumin: 4.3 g/dL (ref 3.8–4.9)
Alkaline Phosphatase: 68 IU/L (ref 44–121)
BUN/Creatinine Ratio: 7 — ABNORMAL LOW (ref 9–23)
BUN: 5 mg/dL — ABNORMAL LOW (ref 6–24)
Bilirubin Total: 0.4 mg/dL (ref 0.0–1.2)
CO2: 24 mmol/L (ref 20–29)
Calcium: 9.7 mg/dL (ref 8.7–10.2)
Chloride: 97 mmol/L (ref 96–106)
Creatinine, Ser: 0.68 mg/dL (ref 0.57–1.00)
Globulin, Total: 2.6 g/dL (ref 1.5–4.5)
Glucose: 84 mg/dL (ref 65–99)
Potassium: 4.6 mmol/L (ref 3.5–5.2)
Sodium: 138 mmol/L (ref 134–144)
Total Protein: 6.9 g/dL (ref 6.0–8.5)
eGFR: 102 mL/min/{1.73_m2} (ref >59)

## 2020-09-15 LAB — LIPID PANEL
Chol/HDL Ratio: 3.8 ratio (ref 0.0–4.4)
Cholesterol, Total: 261 mg/dL — ABNORMAL HIGH (ref 100–199)
HDL: 69 mg/dL (ref >39)
LDL Chol Calc (NIH): 181 mg/dL — ABNORMAL HIGH (ref 0–99)
Triglycerides: 67 mg/dL (ref 0–149)
VLDL Cholesterol Cal: 11 mg/dL (ref 5–40)

## 2020-09-15 LAB — TSH: TSH: 2.16 u[IU]/mL (ref 0.450–4.500)

## 2020-09-15 MED ORDER — ROSUVASTATIN CALCIUM 10 MG PO TABS: 10.0000 mg | ORAL_TABLET | Freq: Every day | ORAL | 1 refills | Status: DC

## 2020-09-15 MED ORDER — LEVOTHYROXINE SODIUM 25 MCG PO TABS: 25.0000 ug | ORAL_TABLET | Freq: Every day | ORAL | 3 refills | Status: DC

## 2020-09-17 DIAGNOSIS — M85832 Other specified disorders of bone density and structure, left forearm: Secondary | ICD-10-CM | POA: Diagnosis not present

## 2020-09-17 DIAGNOSIS — M810 Age-related osteoporosis without current pathological fracture: Secondary | ICD-10-CM | POA: Diagnosis not present

## 2020-09-17 DIAGNOSIS — Z780 Asymptomatic menopausal state: Secondary | ICD-10-CM | POA: Diagnosis not present

## 2020-09-29 DIAGNOSIS — Z1211 Encounter for screening for malignant neoplasm of colon: Secondary | ICD-10-CM | POA: Diagnosis not present

## 2020-10-01 ENCOUNTER — Ambulatory Visit (INDEPENDENT_AMBULATORY_CARE_PROVIDER_SITE_OTHER): Payer: BC Managed Care – PPO | Admitting: Pharmacist

## 2020-10-01 ENCOUNTER — Other Ambulatory Visit: Payer: Self-pay

## 2020-10-01 DIAGNOSIS — M810 Age-related osteoporosis without current pathological fracture: Secondary | ICD-10-CM | POA: Diagnosis not present

## 2020-10-01 MED ORDER — ALENDRONATE SODIUM 70 MG PO TABS: 70.0000 mg | ORAL_TABLET | ORAL | 11 refills | Status: DC

## 2020-10-01 NOTE — Progress Notes (Signed)
     10/01/2020 Name: Amanda York MRN: [id number] DOB: 01/01/1966   S:  27 yoF presents for osteoporosis education and management today  Current Height:   5'1"     Max Lifetime Height:  5'1" Current Weight:       125lb  Ethnicity:Caucasian   HPI: Osteoporosis new diagnosis  Back Pain?  No       Kyphosis?  No Prior fracture?  No Med(s) for Osteoporosis/Osteopenia:  n/a Med(s) previously tried for Osteoporosis/Osteopenia:  n/a                                                             PMH: Age at menopause:  89s Hysterectomy?  No Oophorectomy?  No HRT? No Steroid Use?  No Thyroid med?  Yes History of cancer?  yes, grandmother breast cancer History of digestive disorders (ie Crohn's)?  No Current or previous eating disorders?  No Last Vitamin D Result:  getting today Last GFR Result:  100   FH/SH: Family history of osteoporosis?  No Parent with history of hip fracture?  No Family history of breast cancer?  Yes grandmother Exercise?  No Smoking?  Yes  Alcohol?  No    Calcium Assessment Calcium Intake  # of servings/day  Calcium mg  Milk (8 oz) 0  x  300  = 0  Yogurt (4 oz) 200 x  200 = 400  Cheese (1 oz) 200 x  200 = 400  Other Calcium sources   250mg   Ca supplement 0 = 0   Estimated calcium intake per day 1050   Assessment: FINDINGS: AP LUMBAR SPINE L1-L3 Bone Mineral Density (BMD):  0.841 g/cm2 Young Adult T-Score:  -2.8 Z-Score:  -1.6   RIGHT FEMUR NECK Bone Mineral Density (BMD):  0.586 g/cm2 Young Adult T-Score: -3.3  Z-Score:  -2.0   LEFT FOREARM (1/3 RADIUS) Bone Mineral Density (BMD):  0.720 Young Adult T Score:  -1.9 Z Score:  -1.3  ASSESSMENT: Patient's diagnostic category is OSTEOPOROSIS by WHO Criteria.   Recommendations: 1.  Start alendronate (FOSAMAX)  2.  recommend calcium 1200mg  daily through supplementation or diet.  3.  recommend weight bearing exercise - 30 minutes at least 4 days per week.   4.  Counseled and educated about  fall risk and prevention. 5. Encouraged patient to stop smoking  Recheck DEXA:  2 years  Time spent counseling patient:  25 minutes   Regina Eck, PharmD, BCPS Clinical Pharmacist, [place]  [facility] Phone [phone]

## 2020-10-02 LAB — VITAMIN D 25 HYDROXY (VIT D DEFICIENCY, FRACTURES): Vit D, 25-Hydroxy: 21.4 ng/mL — ABNORMAL LOW (ref 30.0–100.0)

## 2020-10-06 LAB — COLOGUARD: COLOGUARD: NEGATIVE

## 2020-10-06 LAB — EXTERNAL GENERIC LAB PROCEDURE: COLOGUARD: NEGATIVE

## 2020-10-07 ENCOUNTER — Other Ambulatory Visit: Payer: Self-pay | Admitting: Family

## 2020-10-07 DIAGNOSIS — E559 Vitamin D deficiency, unspecified: Secondary | ICD-10-CM

## 2020-10-07 MED ORDER — VITAMIN D (ERGOCALCIFEROL) 1.25 MG (50000 UNIT) PO CAPS: 50000.0000 [IU] | ORAL_CAPSULE | ORAL | 3 refills | Status: DC

## 2020-10-08 ENCOUNTER — Telehealth: Payer: Self-pay | Admitting: Family Medicine

## 2020-10-08 DIAGNOSIS — M810 Age-related osteoporosis without current pathological fracture: Secondary | ICD-10-CM

## 2020-10-08 DIAGNOSIS — E559 Vitamin D deficiency, unspecified: Secondary | ICD-10-CM

## 2020-10-08 NOTE — Telephone Encounter (Signed)
Pt called stating that ever since she started taking her calcium pill for her arthritis, she has been experiencing a lot of abdominal pain.   Needs advise. Call pt at [phone]

## 2020-10-09 ENCOUNTER — Other Ambulatory Visit: Payer: Self-pay

## 2020-10-09 ENCOUNTER — Encounter (HOSPITAL_COMMUNITY): Payer: Self-pay

## 2020-10-09 ENCOUNTER — Emergency Department (HOSPITAL_COMMUNITY): Payer: BC Managed Care – PPO

## 2020-10-09 ENCOUNTER — Emergency Department (HOSPITAL_COMMUNITY)
Admission: EM | Admit: 2020-10-09 | Discharge: 2020-10-09 | Disposition: A | Discharge status: Home / Self Care | Payer: BC Managed Care – PPO | Attending: Emergency Medicine | Admitting: Emergency Medicine

## 2020-10-09 DIAGNOSIS — F17210 Nicotine dependence, cigarettes, uncomplicated: Secondary | ICD-10-CM | POA: Diagnosis not present

## 2020-10-09 DIAGNOSIS — Z8719 Personal history of other diseases of the digestive system: Secondary | ICD-10-CM | POA: Diagnosis not present

## 2020-10-09 DIAGNOSIS — K3189 Other diseases of stomach and duodenum: Secondary | ICD-10-CM | POA: Diagnosis not present

## 2020-10-09 DIAGNOSIS — I10 Essential (primary) hypertension: Secondary | ICD-10-CM | POA: Diagnosis not present

## 2020-10-09 DIAGNOSIS — Z7952 Long term (current) use of systemic steroids: Secondary | ICD-10-CM | POA: Diagnosis not present

## 2020-10-09 DIAGNOSIS — E039 Hypothyroidism, unspecified: Secondary | ICD-10-CM | POA: Insufficient documentation

## 2020-10-09 DIAGNOSIS — R1013 Epigastric pain: Secondary | ICD-10-CM

## 2020-10-09 DIAGNOSIS — K5730 Diverticulosis of large intestine without perforation or abscess without bleeding: Secondary | ICD-10-CM | POA: Diagnosis not present

## 2020-10-09 DIAGNOSIS — K6389 Other specified diseases of intestine: Secondary | ICD-10-CM | POA: Diagnosis not present

## 2020-10-09 DIAGNOSIS — R110 Nausea: Secondary | ICD-10-CM | POA: Insufficient documentation

## 2020-10-09 DIAGNOSIS — R109 Unspecified abdominal pain: Secondary | ICD-10-CM | POA: Diagnosis not present

## 2020-10-09 DIAGNOSIS — J449 Chronic obstructive pulmonary disease, unspecified: Secondary | ICD-10-CM | POA: Insufficient documentation

## 2020-10-09 DIAGNOSIS — Z79899 Other long term (current) drug therapy: Secondary | ICD-10-CM | POA: Insufficient documentation

## 2020-10-09 DIAGNOSIS — I700 Atherosclerosis of aorta: Secondary | ICD-10-CM | POA: Diagnosis not present

## 2020-10-09 HISTORY — DX: Unspecified osteoarthritis, unspecified site: M19.90

## 2020-10-09 LAB — URINALYSIS, ROUTINE W REFLEX MICROSCOPIC
Bacteria, UA: NONE SEEN
Bilirubin Urine: NEGATIVE
Glucose, UA: NEGATIVE mg/dL
Ketones, ur: NEGATIVE mg/dL
Leukocytes,Ua: NEGATIVE
Nitrite: NEGATIVE
Protein, ur: NEGATIVE mg/dL
Specific Gravity, Urine: 1.002 — ABNORMAL LOW (ref 1.005–1.030)
pH: 6 (ref 5.0–8.0)

## 2020-10-09 LAB — COMPREHENSIVE METABOLIC PANEL
ALT: 13 U/L (ref 0–44)
AST: 17 U/L (ref 15–41)
Albumin: 4.2 g/dL (ref 3.5–5.0)
Alkaline Phosphatase: 59 U/L (ref 38–126)
Anion gap: 9 (ref 5–15)
BUN: 8 mg/dL (ref 6–20)
CO2: 28 mmol/L (ref 22–32)
Calcium: 9 mg/dL (ref 8.9–10.3)
Chloride: 94 mmol/L — ABNORMAL LOW (ref 98–111)
Creatinine, Ser: 0.58 mg/dL (ref 0.44–1.00)
GFR, Estimated: >60 mL/min (ref >60)
Glucose, Bld: 95 mg/dL (ref 70–99)
Potassium: 3.3 mmol/L — ABNORMAL LOW (ref 3.5–5.1)
Sodium: 131 mmol/L — ABNORMAL LOW (ref 135–145)
Total Bilirubin: 0.6 mg/dL (ref 0.3–1.2)
Total Protein: 7.2 g/dL (ref 6.5–8.1)

## 2020-10-09 LAB — CBC WITH DIFFERENTIAL/PLATELET
Abs Immature Granulocytes: 0.01 10*3/uL (ref 0.00–0.07)
Basophils Absolute: 0 10*3/uL (ref 0.0–0.1)
Basophils Relative: 1 %
Eosinophils Absolute: 0.1 10*3/uL (ref 0.0–0.5)
Eosinophils Relative: 1 %
HCT: 47.2 % — ABNORMAL HIGH (ref 36.0–46.0)
Hemoglobin: 16.1 g/dL — ABNORMAL HIGH (ref 12.0–15.0)
Immature Granulocytes: 0 %
Lymphocytes Relative: 34 %
Lymphs Abs: 2.1 10*3/uL (ref 0.7–4.0)
MCH: 30.3 pg (ref 26.0–34.0)
MCHC: 34.1 g/dL (ref 30.0–36.0)
MCV: 88.9 fL (ref 80.0–100.0)
Monocytes Absolute: 0.4 10*3/uL (ref 0.1–1.0)
Monocytes Relative: 7 %
Neutro Abs: 3.4 10*3/uL (ref 1.7–7.7)
Neutrophils Relative %: 57 %
Platelets: 251 10*3/uL (ref 150–400)
RBC: 5.31 MIL/uL — ABNORMAL HIGH (ref 3.87–5.11)
RDW: 13.9 % (ref 11.5–15.5)
WBC: 6 10*3/uL (ref 4.0–10.5)
nRBC: 0 % (ref 0.0–0.2)

## 2020-10-09 LAB — LIPASE, BLOOD: Lipase: 29 U/L (ref 11–51)

## 2020-10-09 LAB — PREGNANCY, URINE: Preg Test, Ur: NEGATIVE

## 2020-10-09 MED ORDER — SODIUM CHLORIDE 0.9 % IV BOLUS
1000.0000 mL | Freq: Once | INTRAVENOUS | Status: AC
  Administered 2020-10-09: 1000 mL via INTRAVENOUS

## 2020-10-09 MED ORDER — MORPHINE SULFATE (PF) 4 MG/ML IV SOLN
4.0000 mg | INTRAVENOUS | Status: DC | PRN
  Filled 2020-10-09: qty 1

## 2020-10-09 MED ORDER — NAPROXEN 500 MG PO TABS: 500.0000 mg | ORAL_TABLET | Freq: Two times a day (BID) | ORAL | 0 refills | Status: DC

## 2020-10-09 MED ORDER — IOHEXOL 300 MG/ML  SOLN
100.0000 mL | Freq: Once | INTRAMUSCULAR | Status: AC | PRN
  Administered 2020-10-09: 100 mL via INTRAVENOUS

## 2020-10-09 MED ORDER — ONDANSETRON HCL 4 MG/2ML IJ SOLN
4.0000 mg | Freq: Four times a day (QID) | INTRAMUSCULAR | Status: DC | PRN
  Administered 2020-10-09: 4 mg via INTRAVENOUS
  Filled 2020-10-09: qty 2

## 2020-10-09 MED ORDER — PANTOPRAZOLE SODIUM 20 MG PO TBEC: 20.0000 mg | DELAYED_RELEASE_TABLET | Freq: Every day | ORAL | 0 refills | Status: DC

## 2020-10-09 NOTE — ED Triage Notes (Signed)
Pt presents to ED with complaints of mid upper abdominal pain started Tuesday with nausea. Pt states feels like a muscle spasm.

## 2020-10-09 NOTE — ED Provider Notes (Signed)
[facility] EMERGENCY DEPARTMENT Provider Note   CSN: [id number] Arrival date & time: 10/09/20  1738     History Chief Complaint  Patient presents with   Abdominal Pain    Amanda York is a 55 y.o. female.   Abdominal Pain Associated symptoms: nausea   Associated symptoms: no diarrhea    This patient is a 55 year old female, she has no prior abdominal surgical history though she does report a history of diverticulitis in the past.  She was diagnosed with osteoporosis and started taking alendronate on Monday.  She reports that on Tuesday she started to develop some epigastric pain after eating.  This was initially intermittent but starting last night after eating cottage cheese it became persistent, she woke up this morning with ongoing pain though it was not too bad until she had eggs and toast at which time it became severe.  She is nauseated with it, it is currently 7 out of 10, there is no radiation of the pain to the shoulders or the back, she is not having any urinary symptoms, she has noted that her stools have been yellow and foul-smelling and greasy in a different consistency than they normally are.  She has not had any specific medicines prior to arrival.  She does not drink alcohol, she does smoke cigarettes, she does not take anti-inflammatory medications very often.  She has no history of peptic ulcer disease pancreatitis or gallbladder disease.  Past Medical History:  Diagnosis Date   Arthritis    Atypical chest pain 10/20/2014   Chest pain 10/19/2014   COPD (chronic obstructive pulmonary disease) ([place])    Hypertension    Hypothyroidism    Tobacco abuse     Patient Active Problem List   Diagnosis Date Noted   Vitamin D deficiency 10/07/2020   Generalized anxiety disorder 06/04/2018   Depression 04/28/2015   COPD (chronic obstructive pulmonary disease) ([place]) 10/20/2014   Tobacco abuse 10/20/2014   Hypertension 10/19/2014    Past Surgical History:  Procedure  Laterality Date   none       OB History   No obstetric history on file.     Family History  Problem Relation Age of Onset   CAD Father        Died at 35 of MI   Coronary artery disease Father    CAD Paternal Uncle        Died at 73 of MI   CAD Paternal Grandfather        Details unclaer   COPD Sister     Social History   Tobacco Use   Smoking status: Every Day    Packs/day: 1.00    Years: 41.00    Pack years: 41.00    Types: Cigarettes   Smokeless tobacco: Never   Tobacco comments:    Since age 21  Vaping Use   Vaping Use: Never used  Substance Use Topics   Alcohol use: No    Alcohol/week: 0.0 standard drinks   Drug use: No    Home Medications Prior to Admission medications   Medication Sig Start Date End Date Taking? Authorizing Provider  albuterol (VENTOLIN HFA) 108 (90 Base) MCG/ACT inhaler Inhale 2 puffs into the lungs every 6 (six) hours as needed for wheezing or shortness of breath. 09/14/20  Yes Gottschalk, Ashly M, DO  busPIRone (BUSPAR) 10 MG tablet TAKE 1 TABLET(10 MG) BY MOUTH THREE TIMES DAILY 09/14/20  Yes Gottschalk, Ashly M, DO  cetirizine (ZYRTEC) 10 MG  tablet Take 1 tablet (10 mg total) by mouth daily. 09/14/20  Yes Gottschalk, Ashly M, DO  escitalopram (LEXAPRO) 20 MG tablet Take 1 tablet (20 mg total) by mouth daily. 09/14/20  Yes Gottschalk, Leatrice Jewels M, DO  Fluticasone-Umeclidin-Vilant (TRELEGY ELLIPTA) 100-62.5-25 MCG/INH AEPB Inhale 1 puff into the lungs daily. 09/14/20  Yes Ronnie Doss M, DO  levothyroxine (SYNTHROID) 25 MCG tablet Take 1 tablet (25 mcg total) by mouth daily before breakfast. (Needs to be seen before next refill) 09/15/20  Yes Gottschalk, Ashly M, DO  lisinopril (ZESTRIL) 10 MG tablet Take 1 tablet (10 mg total) by mouth daily. 09/14/20  Yes Ronnie Doss M, DO  naproxen (NAPROSYN) 500 MG tablet Take 1 tablet (500 mg total) by mouth 2 (two) times daily with a meal. 10/09/20  Yes Noemi Chapel, MD  pantoprazole (PROTONIX) 20 MG  tablet Take 1 tablet (20 mg total) by mouth daily. 10/09/20 11/08/20 Yes Noemi Chapel, MD  traZODone (DESYREL) 50 MG tablet TAKE 1 TO 1 AND 1/2 TABLETS(50 TO 75 MG) BY MOUTH AT BEDTIME AS NEEDED FOR SLEEP 09/14/20  Yes Ronnie Doss M, DO  Vitamin D, Ergocalciferol, (DRISDOL) 1.25 MG (50000 UNIT) CAPS capsule Take 1 capsule (50,000 Units total) by mouth every 7 (seven) days. 10/07/20  Yes Hawks, Christy A, FNP  aspirin EC 81 MG EC tablet Take 1 tablet (81 mg total) by mouth daily. Patient not taking: Reported on 09/14/2020 10/20/14   Dhungel, Flonnie Overman, MD  rosuvastatin (CRESTOR) 10 MG tablet Take 1 tablet (10 mg total) by mouth daily. Patient not taking: No sig reported 09/15/20   Janora Norlander, DO    Allergies    Patient has no known allergies.  Review of Systems   Review of Systems  Gastrointestinal:  Positive for abdominal pain and nausea. Negative for diarrhea.  All other systems reviewed and are negative.  Physical Exam Updated Vital Signs BP 128/83   Pulse 86   Temp 97.9 F (36.6 C) (Oral)   Resp 20   Ht 1.549 m (5\' 1" )   Wt 56.7 kg   SpO2 98%   BMI 23.62 kg/m   Physical Exam Vitals and nursing note reviewed.  Constitutional:      General: She is not in acute distress.    Appearance: She is well-developed.  HENT:     Head: Normocephalic and atraumatic.     Mouth/Throat:     Pharynx: No oropharyngeal exudate.  Eyes:     General: No scleral icterus.       Right eye: No discharge.        Left eye: No discharge.     Conjunctiva/sclera: Conjunctivae normal.     Pupils: Pupils are equal, round, and reactive to light.  Neck:     Thyroid: No thyromegaly.     Vascular: No JVD.  Cardiovascular:     Rate and Rhythm: Normal rate and regular rhythm.     Heart sounds: Normal heart sounds. No murmur heard.   No friction rub. No gallop.  Pulmonary:     Effort: Pulmonary effort is normal. No respiratory distress.     Breath sounds: Normal breath sounds. No wheezing or  rales.  Abdominal:     General: Bowel sounds are normal. There is no distension.     Palpations: Abdomen is soft. There is no mass.     Tenderness: There is abdominal tenderness (epigasrtric ttp without guarding, no other abd ttp).  Musculoskeletal:        General:  No tenderness. Normal range of motion.     Cervical back: Normal range of motion and neck supple.     Right lower leg: No edema.     Left lower leg: No edema.  Lymphadenopathy:     Cervical: No cervical adenopathy.  Skin:    General: Skin is warm and dry.     Coloration: Skin is not jaundiced.     Findings: No erythema or rash.  Neurological:     General: No focal deficit present.     Mental Status: She is alert.     Cranial Nerves: No cranial nerve deficit.     Motor: No weakness.     Coordination: Coordination normal.  Psychiatric:        Behavior: Behavior normal.    ED Results / Procedures / Treatments   Labs (all labs ordered are listed, but only abnormal results are displayed) Labs Reviewed  COMPREHENSIVE METABOLIC PANEL - Abnormal; Notable for the following components:      Result Value   Sodium 131 (*)    Potassium 3.3 (*)    Chloride 94 (*)    All other components within normal limits  CBC WITH DIFFERENTIAL/PLATELET - Abnormal; Notable for the following components:   RBC 5.31 (*)    Hemoglobin 16.1 (*)    HCT 47.2 (*)    All other components within normal limits  URINALYSIS, ROUTINE W REFLEX MICROSCOPIC - Abnormal; Notable for the following components:   Color, Urine COLORLESS (*)    Specific Gravity, Urine 1.002 (*)    Hgb urine dipstick SMALL (*)    All other components within normal limits  LIPASE, BLOOD  PREGNANCY, URINE    EKG EKG Interpretation  Date/Time:  Saturday October 09 2020 18:02:23 EDT Ventricular Rate:  88 PR Interval:  159 QRS Duration: 74 QT Interval:  347 QTC Calculation: 420 R Axis:   64 Text Interpretation: Sinus rhythm Probable left atrial enlargement Confirmed by  Noemi Chapel [phone]) on 10/09/2020 6:18:08 PM  Radiology CT ABDOMEN PELVIS W CONTRAST  Result Date: 10/09/2020 CLINICAL DATA:  Epigastric pain EXAM: CT ABDOMEN AND PELVIS WITH CONTRAST TECHNIQUE: Multidetector CT imaging of the abdomen and pelvis was performed using the standard protocol following bolus administration of intravenous contrast. CONTRAST:  129mL OMNIPAQUE IOHEXOL 300 MG/ML  SOLN COMPARISON:  CT May 28, 2009 FINDINGS: Lower chest: Right lower lobe atelectasis. Normal size heart. No significant pericardial effusion/thickening. Hepatobiliary: No suspicious hepatic lesions. Mild periportal edema. Gallbladder is unremarkable. No biliary ductal dilation. Pancreas: Unremarkable. No pancreatic ductal dilatation or surrounding inflammatory changes. Spleen: Within normal limits. Adrenals/Urinary Tract: Bilateral adrenal glands are unremarkable. No hydronephrosis. Symmetric enhancement excretion of contrast from kidneys. Urinary bladder is grossly unremarkable. Stomach/Bowel: Stomach is predominantly decompressed limiting evaluation. No pathologic dilation of small bowel. Short segment wall thickening mucosal hyperenhancement and adjacent inflammatory stranding involving the distal loop of small bowel in the pelvis on image 62/2. There is mild diffuse colonic wall thickening extending from sigmoid colon to the hepatic flexure. Sigmoid colonic diverticulosis without evidence of acute diverticulitis. Vascular/Lymphatic: Aortic atherosclerosis without aneurysmal dilation. No pathologically enlarged abdominopelvic lymph nodes. Reproductive: Uterus and right adnexa are unremarkable. Simple appearing 2.3 cm left adnexal cyst. No follow-up imaging recommended. Note: This recommendation does not apply to premenarchal patients and to those with increased risk (genetic, family history, elevated tumor markers or other high-risk factors) of ovarian cancer. Reference: JACR 2020 Feb; 17(2):248-254 Other: Trace pelvic  free fluid. Musculoskeletal: Mild multilevel degenerative changes  spine. No acute osseous [place] IMPRESSION: 1. Short segment wall thickening and mucosal hyperenhancement involving a distal loop of small bowel in the pelvis with adjacent inflammatory stranding, and mild diffuse colonic wall thickening extending from the sigmoid colon to the hepatic flexure, constellation of findings which may represent an infectious or inflammatory enterocolitis. 2. Trace pelvic free fluid likely reactive. 3. Sigmoid colonic diverticulosis without evidence of acute diverticulitis. 4.  Aortic Atherosclerosis (ICD10-I70.0). Electronically Signed   By: Dahlia Bailiff MD   On: 10/09/2020 19:55    Procedures Procedures   Medications Ordered in ED Medications  ondansetron [facility]) injection 4 mg (4 mg Intravenous Given 10/09/20 1822)  morphine 4 MG/ML injection 4 mg (4 mg Intravenous Patient Refused/Not Given 10/09/20 1823)  sodium chloride 0.9 % bolus 1,000 mL (0 mLs Intravenous Stopped 10/09/20 1941)  iohexol (OMNIPAQUE) 300 MG/ML solution 100 mL (100 mLs Intravenous Contrast Given 10/09/20 1908)    ED Course  I have reviewed the triage vital signs and the nursing notes.  Pertinent labs & imaging results that were available during my care of the patient were reviewed by me and considered in my medical decision making (see chart for details).    MDM Rules/Calculators/A&P                          I would consider in my differential multiple things such as diverticulitis, pancreatitis and gallbladder disease  CT / labs / UA, fluids, meds PRN - pt declines at this time.  CT unremarkable - possible small segment of inflamed bowel, but nothing surgical - may be due to meds - or gastritis as well   Protonix and naprosyn for home Stop alendronate. Pt agreeable.  Final Clinical Impression(s) / ED Diagnoses Final diagnoses:  Epigastric pain    Rx / DC Orders ED Discharge Orders          Ordered    naproxen  (NAPROSYN) 500 MG tablet  2 times daily with meals        10/09/20 2049    pantoprazole (PROTONIX) 20 MG tablet  Daily        10/09/20 2050             Noemi Chapel, MD 10/09/20 2051

## 2020-10-09 NOTE — ED Notes (Signed)
Pt ambulatory to the restroom without difficulty. Gait steady and even.  

## 2020-10-09 NOTE — Discharge Instructions (Addendum)
Your testing has been reassuring, there is no signs of surgical findings or pancreatitis or gallbladder disease.  I suspect that you have some degree of gastritis which is when there is too much acid in your stomach causing pain.  You would need to take a medication to reduce the acid which I have prescribed.  You may use naproxen for severe pain but please stop taking the new osteoporosis medicine alendronate as this may have been causing some of the abdominal pain as well.  You may talk to her family doctor about this this week.  ER for worsening symptoms

## 2020-10-11 NOTE — Telephone Encounter (Signed)
Pain was so bad patient ended up going to ER [facility] Saturday they told her to quit taken and let you know. They told her she had bad Acid reflux on rx Protonix.

## 2020-10-11 NOTE — Telephone Encounter (Signed)
Poor dear.  Ok so definitely sounds like her body is not agreeing with the bisphosphonates.  Would like her to get set up for Prolia if she is amenable.  Will cc to Prolia pool as well.

## 2020-10-11 NOTE — Telephone Encounter (Signed)
Try taking with food.

## 2020-10-11 NOTE — Telephone Encounter (Signed)
Patient is aware and is agreeable to starting Prolia as long as it is affordable.  She will wait [place] to call her back with this information.

## 2020-10-18 NOTE — Telephone Encounter (Signed)
Checking benefits

## 2021-02-15 ENCOUNTER — Encounter: Payer: Self-pay | Admitting: Family Medicine

## 2021-02-15 ENCOUNTER — Ambulatory Visit (INDEPENDENT_AMBULATORY_CARE_PROVIDER_SITE_OTHER): Payer: BC Managed Care – PPO | Admitting: Family Medicine

## 2021-02-15 DIAGNOSIS — J441 Chronic obstructive pulmonary disease with (acute) exacerbation: Secondary | ICD-10-CM

## 2021-02-15 MED ORDER — PREDNISONE 10 MG PO TABS: ORAL_TABLET | ORAL | 0 refills | Status: DC

## 2021-02-15 MED ORDER — CEFPROZIL 500 MG PO TABS: 500.0000 mg | ORAL_TABLET | Freq: Two times a day (BID) | ORAL | 0 refills | Status: DC

## 2021-02-15 NOTE — Progress Notes (Signed)
Subjective:    Patient ID: Amanda York, female    DOB: 10/31/1965, 55 y.o.   MRN: [id number]   HPI: Amanda York is a 55 y.o. female presenting for COPD flare. Congestion. Coughing green and yellow sputum. Making her short of breath. Onset was 2 weeks ago. Gradually getting worse. Using Trelegy daily. Also using the albuterol frequently. USing nyquil with no relief.    Depression screen [facility] 2/9 09/14/2020 08/04/2020 08/20/2019 12/25/2018 06/18/2018  Decreased Interest 0 0 0 0 0  Down, Depressed, Hopeless 0 0 0 0 0  PHQ - 2 Score 0 0 0 0 0  Altered sleeping 0 0 0 0 0  Tired, decreased energy 0 0 0 0 0  Change in appetite 0 0 0 0 0  Feeling bad or failure about yourself  0 0 0 0 0  Trouble concentrating 0 0 0 0 -  Moving slowly or fidgety/restless 0 0 0 0 0  Suicidal thoughts 0 0 0 0 0  PHQ-9 Score 0 0 0 0 0  Difficult doing work/chores Not difficult at all Not difficult at all - - -  Some recent data might be hidden     Relevant past medical, surgical, family and social history reviewed and updated as indicated.  Interim medical history since our last visit reviewed. Allergies and medications reviewed and updated.  ROS:  Review of Systems  Constitutional:  Negative for activity change, appetite change, chills and fever.  HENT:  Positive for congestion. Negative for ear discharge, ear pain, hearing loss, nosebleeds, postnasal drip, rhinorrhea, sinus pressure, sneezing and trouble swallowing.   Respiratory:  Positive for cough and shortness of breath. Negative for chest tightness.   Cardiovascular:  Negative for chest pain and palpitations.  Skin:  Negative for rash.    Social History   Tobacco Use  Smoking Status Every Day   Packs/day: 1.00   Years: 41.00   Pack years: 41.00   Types: Cigarettes  Smokeless Tobacco Never  Tobacco Comments   Since age 7       Objective:     Wt Readings from Last 3 Encounters:  10/09/20 125 lb (56.7 kg)  09/14/20 125 lb 3.2 oz  (56.8 kg)  08/04/20 126 lb 6 oz (57.3 kg)     Exam deferred. Pt. [place] due to COVID 19. Phone visit performed.   Assessment & Plan:   1. Acute exacerbation of chronic obstructive pulmonary disease (COPD) ([place])     Meds ordered this encounter  Medications   cefPROZIL (CEFZIL) 500 MG tablet    Sig: Take 1 tablet (500 mg total) by mouth 2 (two) times daily. For infection. Take all of this medication.    Dispense:  20 tablet    Refill:  0   predniSONE (DELTASONE) 10 MG tablet    Sig: Take 5 daily for 2 days followed by 4,3,2 and 1 for 2 days each.    Dispense:  30 tablet    Refill:  0    No orders of the defined types were placed in this encounter.     Diagnoses and all orders for this visit:  Acute exacerbation of chronic obstructive pulmonary disease (COPD) ([place])  Other orders -     cefPROZIL (CEFZIL) 500 MG tablet; Take 1 tablet (500 mg total) by mouth 2 (two) times daily. For infection. Take all of this medication. -     predniSONE (DELTASONE) 10 MG tablet; Take 5 daily for 2 days followed  by 4,3,2 and 1 for 2 days each.   Virtual Visit via telephone Note  I discussed the limitations, risks, security and privacy concerns of performing an evaluation and management service by telephone and the availability of in person appointments. The patient was identified with two identifiers. Pt.expressed understanding and agreed to proceed. Pt. Is at home. Dr. Livia Snellen is in his office.  Follow Up Instructions:   I discussed the assessment and treatment plan with the patient. The patient was provided an opportunity to ask questions and all were answered. The patient agreed with the plan and demonstrated an understanding of the instructions.   The patient was advised to call back or seek an in-person evaluation if the symptoms worsen or if the condition fails to improve as anticipated.   Total minutes including chart review and phone contact time: 17   Follow up plan: Return if  symptoms worsen or fail to improve.  Claretta Fraise, MD [place]

## 2021-02-19 ENCOUNTER — Emergency Department (HOSPITAL_COMMUNITY)
Admission: EM | Admit: 2021-02-19 | Discharge: 2021-02-19 | Disposition: A | Discharge status: Home / Self Care | Payer: BC Managed Care – PPO | Attending: Emergency Medicine | Admitting: Emergency Medicine

## 2021-02-19 ENCOUNTER — Emergency Department (HOSPITAL_COMMUNITY): Payer: BC Managed Care – PPO

## 2021-02-19 ENCOUNTER — Encounter (HOSPITAL_COMMUNITY): Payer: Self-pay | Admitting: *Deleted

## 2021-02-19 ENCOUNTER — Other Ambulatory Visit: Payer: Self-pay

## 2021-02-19 DIAGNOSIS — R0902 Hypoxemia: Secondary | ICD-10-CM | POA: Diagnosis not present

## 2021-02-19 DIAGNOSIS — Z79899 Other long term (current) drug therapy: Secondary | ICD-10-CM | POA: Diagnosis not present

## 2021-02-19 DIAGNOSIS — R457 State of emotional shock and stress, unspecified: Secondary | ICD-10-CM | POA: Diagnosis not present

## 2021-02-19 DIAGNOSIS — J441 Chronic obstructive pulmonary disease with (acute) exacerbation: Secondary | ICD-10-CM

## 2021-02-19 DIAGNOSIS — F17210 Nicotine dependence, cigarettes, uncomplicated: Secondary | ICD-10-CM | POA: Insufficient documentation

## 2021-02-19 DIAGNOSIS — J439 Emphysema, unspecified: Secondary | ICD-10-CM | POA: Diagnosis not present

## 2021-02-19 DIAGNOSIS — Z20822 Contact with and (suspected) exposure to covid-19: Secondary | ICD-10-CM | POA: Insufficient documentation

## 2021-02-19 DIAGNOSIS — E039 Hypothyroidism, unspecified: Secondary | ICD-10-CM | POA: Insufficient documentation

## 2021-02-19 DIAGNOSIS — Z7951 Long term (current) use of inhaled steroids: Secondary | ICD-10-CM | POA: Diagnosis not present

## 2021-02-19 DIAGNOSIS — I10 Essential (primary) hypertension: Secondary | ICD-10-CM | POA: Insufficient documentation

## 2021-02-19 DIAGNOSIS — J449 Chronic obstructive pulmonary disease, unspecified: Secondary | ICD-10-CM

## 2021-02-19 DIAGNOSIS — R0602 Shortness of breath: Secondary | ICD-10-CM | POA: Diagnosis not present

## 2021-02-19 DIAGNOSIS — Z7982 Long term (current) use of aspirin: Secondary | ICD-10-CM | POA: Diagnosis not present

## 2021-02-19 DIAGNOSIS — R062 Wheezing: Secondary | ICD-10-CM | POA: Diagnosis not present

## 2021-02-19 LAB — CBC WITH DIFFERENTIAL/PLATELET
Abs Immature Granulocytes: 0.02 10*3/uL (ref 0.00–0.07)
Basophils Absolute: 0 10*3/uL (ref 0.0–0.1)
Basophils Relative: 0 %
Eosinophils Absolute: 0 10*3/uL (ref 0.0–0.5)
Eosinophils Relative: 0 %
HCT: 45.7 % (ref 36.0–46.0)
Hemoglobin: 15.7 g/dL — ABNORMAL HIGH (ref 12.0–15.0)
Immature Granulocytes: 0 %
Lymphocytes Relative: 38 %
Lymphs Abs: 2.1 10*3/uL (ref 0.7–4.0)
MCH: 30.3 pg (ref 26.0–34.0)
MCHC: 34.4 g/dL (ref 30.0–36.0)
MCV: 88.2 fL (ref 80.0–100.0)
Monocytes Absolute: 0.4 10*3/uL (ref 0.1–1.0)
Monocytes Relative: 8 %
Neutro Abs: 3 10*3/uL (ref 1.7–7.7)
Neutrophils Relative %: 54 %
Platelets: 237 10*3/uL (ref 150–400)
RBC: 5.18 MIL/uL — ABNORMAL HIGH (ref 3.87–5.11)
RDW: 14 % (ref 11.5–15.5)
WBC: 5.6 10*3/uL (ref 4.0–10.5)
nRBC: 0 % (ref 0.0–0.2)

## 2021-02-19 LAB — RESP PANEL BY RT-PCR (FLU A&B, COVID) ARPGX2
Influenza A by PCR: NEGATIVE
Influenza B by PCR: NEGATIVE
SARS Coronavirus 2 by RT PCR: NEGATIVE

## 2021-02-19 LAB — D-DIMER, QUANTITATIVE: D-Dimer, Quant: <0.27 ug/mL-FEU (ref 0.00–0.50)

## 2021-02-19 LAB — BASIC METABOLIC PANEL
Anion gap: 9 (ref 5–15)
BUN: 6 mg/dL (ref 6–20)
CO2: 28 mmol/L (ref 22–32)
Calcium: 8.5 mg/dL — ABNORMAL LOW (ref 8.9–10.3)
Chloride: 102 mmol/L (ref 98–111)
Creatinine, Ser: 0.66 mg/dL (ref 0.44–1.00)
GFR, Estimated: >60 mL/min (ref >60)
Glucose, Bld: 101 mg/dL — ABNORMAL HIGH (ref 70–99)
Potassium: 3 mmol/L — ABNORMAL LOW (ref 3.5–5.1)
Sodium: 139 mmol/L (ref 135–145)

## 2021-02-19 MED ORDER — PREDNISONE 10 MG PO TABS: ORAL_TABLET | ORAL | 0 refills | Status: DC

## 2021-02-19 MED ORDER — ALBUTEROL SULFATE HFA 108 (90 BASE) MCG/ACT IN AERS
8.0000 | INHALATION_SPRAY | Freq: Once | RESPIRATORY_TRACT | Status: AC
  Administered 2021-02-19: 8 via RESPIRATORY_TRACT
  Filled 2021-02-19: qty 6.7

## 2021-02-19 MED ORDER — POTASSIUM CHLORIDE CRYS ER 20 MEQ PO TBCR
20.0000 meq | EXTENDED_RELEASE_TABLET | Freq: Once | ORAL | Status: AC
  Administered 2021-02-19: 20 meq via ORAL
  Filled 2021-02-19: qty 1

## 2021-02-19 MED ORDER — ALBUTEROL SULFATE HFA 108 (90 BASE) MCG/ACT IN AERS: 2.0000 | INHALATION_SPRAY | Freq: Four times a day (QID) | RESPIRATORY_TRACT | 0 refills | Status: DC | PRN

## 2021-02-19 MED ORDER — METHYLPREDNISOLONE SODIUM SUCC 125 MG IJ SOLR
125.0000 mg | Freq: Once | INTRAMUSCULAR | Status: AC
  Administered 2021-02-19: 125 mg via INTRAVENOUS
  Filled 2021-02-19: qty 2

## 2021-02-19 MED ORDER — MAGNESIUM SULFATE 2 GM/50ML IV SOLN
2.0000 g | Freq: Once | INTRAVENOUS | Status: AC
  Administered 2021-02-19: 2 g via INTRAVENOUS
  Filled 2021-02-19: qty 50

## 2021-02-19 NOTE — ED Triage Notes (Signed)
Pt brought in by [facility] from home with c/o SOB x 2 weeks. EMS reports pt was wheezing and O2 sats were upper 80s to low 90s when they arrived. Duoneb was given by EMS and placed on 2L O2 via [place] with improvement.

## 2021-02-19 NOTE — Discharge Instructions (Signed)
You were seen in the emergency department for worsening shortness of breath and cough.  You had an x-ray with blood work COVID and flu testing.  Your test did not show any significant abnormalities other than your potassium was mildly low.  Please continue your antibiotics and limit your smoking.  We are increasing your steroid taper.  Follow-up with your primary care doctor.  Return to the emergency department if any worsening or concerning symptoms

## 2021-02-19 NOTE — ED Provider Notes (Signed)
[facility] EMERGENCY DEPARTMENT Provider Note   CSN: [id number] Arrival date & time: 02/19/21  1034     History Chief Complaint  Patient presents with   Shortness of Breath    Amanda York is a 55 y.o. female.  She has a history of COPD and smokes about a pack a day.  Complaining of being sick for 2 weeks with cough productive of yellow-green sputum and shortness of breath.  Had a televisit with her primary care doctor and was prescribed prednisone and an antibiotic without any improvement in her symptoms.  Denies any fevers.  Had 1 episode of diarrhea.  No headache body aches abdominal pain.  She did not get her flu vaccine yet this year, is COVID vaccinated.  The history is provided by the patient.  Shortness of Breath Severity:  Moderate Onset quality:  Gradual Duration:  2 weeks Timing:  Constant Progression:  Worsening Chronicity:  New Relieved by:  Nothing Worsened by:  Activity and coughing Ineffective treatments:  Inhaler and rest Associated symptoms: cough, sputum production and wheezing   Associated symptoms: no abdominal pain, no chest pain, no fever, no headaches, no hemoptysis, no rash and no sore throat   Risk factors: tobacco use       Past Medical History:  Diagnosis Date   Arthritis    Atypical chest pain 10/20/2014   Chest pain 10/19/2014   COPD (chronic obstructive pulmonary disease) ([place])    Hypertension    Hypothyroidism    Tobacco abuse     Patient Active Problem List   Diagnosis Date Noted   Vitamin D deficiency 10/07/2020   Generalized anxiety disorder 06/04/2018   Depression 04/28/2015   COPD (chronic obstructive pulmonary disease) ([place]) 10/20/2014   Tobacco abuse 10/20/2014   Hypertension 10/19/2014    Past Surgical History:  Procedure Laterality Date   none       OB History   No obstetric history on file.     Family History  Problem Relation Age of Onset   CAD Father        Died at 68 of [place]   Coronary artery disease  Father    CAD Paternal Uncle        Died at 34 of [place]   CAD Paternal Grandfather        Details unclaer   COPD Sister     Social History   Tobacco Use   Smoking status: Every Day    Packs/day: 1.00    Years: 41.00    Pack years: 41.00    Types: Cigarettes   Smokeless tobacco: Never   Tobacco comments:    Since age 74  Vaping Use   Vaping Use: Never used  Substance Use Topics   Alcohol use: No    Alcohol/week: 0.0 standard drinks   Drug use: No    Home Medications Prior to Admission medications   Medication Sig Start Date End Date Taking? Authorizing Provider  albuterol (VENTOLIN HFA) 108 (90 Base) MCG/ACT inhaler Inhale 2 puffs into the lungs every 6 (six) hours as needed for wheezing or shortness of breath. 09/14/20   Janora Norlander, DO  aspirin EC 81 MG EC tablet Take 1 tablet (81 mg total) by mouth daily. Patient not taking: Reported on 09/14/2020 10/20/14   Dhungel, Flonnie Overman, MD  busPIRone (BUSPAR) 10 MG tablet TAKE 1 TABLET(10 MG) BY MOUTH THREE TIMES DAILY 09/14/20   Ronnie Doss M, DO  cefPROZIL (CEFZIL) 500 MG tablet Take 1  tablet (500 mg total) by mouth 2 (two) times daily. For infection. Take all of this medication. 02/15/21   Claretta Fraise, MD  cetirizine (ZYRTEC) 10 MG tablet Take 1 tablet (10 mg total) by mouth daily. 09/14/20   Janora Norlander, DO  escitalopram (LEXAPRO) 20 MG tablet Take 1 tablet (20 mg total) by mouth daily. 09/14/20   Janora Norlander, DO  Fluticasone-Umeclidin-Vilant (TRELEGY ELLIPTA) 100-62.5-25 MCG/INH AEPB Inhale 1 puff into the lungs daily. 09/14/20   Janora Norlander, DO  levothyroxine (SYNTHROID) 25 MCG tablet Take 1 tablet (25 mcg total) by mouth daily before breakfast. (Needs to be seen before next refill) 09/15/20   Janora Norlander, DO  lisinopril (ZESTRIL) 10 MG tablet Take 1 tablet (10 mg total) by mouth daily. 09/14/20   Janora Norlander, DO  naproxen (NAPROSYN) 500 MG tablet Take 1 tablet (500 mg total) by mouth  2 (two) times daily with a meal. 10/09/20   Noemi Chapel, MD  pantoprazole (PROTONIX) 20 MG tablet Take 1 tablet (20 mg total) by mouth daily. 10/09/20 11/08/20  Noemi Chapel, MD  predniSONE (DELTASONE) 10 MG tablet Take 5 daily for 2 days followed by 4,3,2 and 1 for 2 days each. 02/15/21   Claretta Fraise, MD  rosuvastatin (CRESTOR) 10 MG tablet Take 1 tablet (10 mg total) by mouth daily. Patient not taking: No sig reported 09/15/20   Janora Norlander, DO  traZODone (DESYREL) 50 MG tablet TAKE 1 TO 1 AND 1/2 TABLETS(50 TO 75 MG) BY MOUTH AT BEDTIME AS NEEDED FOR SLEEP 09/14/20   Janora Norlander, DO  Vitamin D, Ergocalciferol, (DRISDOL) 1.25 MG (50000 UNIT) CAPS capsule Take 1 capsule (50,000 Units total) by mouth every 7 (seven) days. 10/07/20   Sharion Balloon, FNP    Allergies    Patient has no known allergies.  Review of Systems   Review of Systems  Constitutional:  Negative for fever.  HENT:  Negative for sore throat.   Eyes:  Negative for visual disturbance.  Respiratory:  Positive for cough, sputum production, shortness of breath and wheezing. Negative for hemoptysis.   Cardiovascular:  Negative for chest pain.  Gastrointestinal:  Positive for diarrhea. Negative for abdominal pain.  Genitourinary:  Negative for dysuria.  Musculoskeletal:  Negative for back pain.  Skin:  Negative for rash.  Neurological:  Negative for headaches.   Physical Exam Updated Vital Signs BP (!) 147/88 (BP Location: Right Arm)   Pulse 87   Temp 98 F (36.7 C) (Oral)   Resp 19   Ht 5\' 1"  (1.549 m)   Wt 56.2 kg   SpO2 94%   BMI 23.43 kg/m   Physical Exam Vitals and nursing note reviewed.  Constitutional:      General: She is not in acute distress.    Appearance: Normal appearance. She is well-developed.  HENT:     Head: Normocephalic and atraumatic.  Eyes:     Conjunctiva/sclera: Conjunctivae normal.  Cardiovascular:     Rate and Rhythm: Normal rate and regular rhythm.     Heart sounds: No  murmur heard. Pulmonary:     Effort: Accessory muscle usage present. No respiratory distress.     Breath sounds: Wheezing present.  Abdominal:     Palpations: Abdomen is soft.     Tenderness: There is no abdominal tenderness. There is no guarding or rebound.  Musculoskeletal:        General: No swelling. Normal range of motion.  Cervical back: Neck supple.     Right lower leg: No tenderness. No edema.     Left lower leg: No tenderness. No edema.  Skin:    General: Skin is warm and dry.     Capillary Refill: Capillary refill takes less than 2 seconds.  Neurological:     General: No focal deficit present.     Mental Status: She is alert.    ED Results / Procedures / Treatments   Labs (all labs ordered are listed, but only abnormal results are displayed) Labs Reviewed  BASIC METABOLIC PANEL - Abnormal; Notable for the following components:      Result Value   Potassium 3.0 (*)    Glucose, Bld 101 (*)    Calcium 8.5 (*)    All other components within normal limits  CBC WITH DIFFERENTIAL/PLATELET - Abnormal; Notable for the following components:   RBC 5.18 (*)    Hemoglobin 15.7 (*)    All other components within normal limits  RESP PANEL BY RT-PCR (FLU A&B, COVID) [id number]  D-DIMER, QUANTITATIVE    EKG EKG Interpretation  Date/Time:  Saturday February 19 2021 10:43:02 EST Ventricular Rate:  93 PR Interval:  140 QRS Duration: 80 QT Interval:  343 QTC Calculation: 427 R Axis:   80 Text Interpretation: Sinus rhythm Right atrial enlargement Nonspecific T abnormalities, lateral leads No significant change since prior 7/22 Confirmed by Aletta Edouard [id number]) on 02/19/2021 10:46:56 AM  Radiology DG Chest Port 1 View  Result Date: 02/19/2021 CLINICAL DATA:  Shortness of breath EXAM: PORTABLE CHEST 1 VIEW COMPARISON:  Chest x-ray 06/18/2018 FINDINGS: Heart size is normal. Mediastinum appears stable. Calcified plaques in the aortic arch. Lungs are hyperinflated with chronic  mildly prominent interstitial lung markings. No focal consolidation identified. No pleural effusion or pneumothorax visualized. IMPRESSION: Emphysematous changes with no acute process identified. Electronically Signed   By: Ofilia Neas M.D.   On: 02/19/2021 11:34    Procedures Procedures   Medications Ordered in ED Medications  magnesium sulfate IVPB 2 g 50 mL (has no administration in time range)  methylPREDNISolone sodium succinate (SOLU-MEDROL) 125 mg/2 mL injection 125 mg (has no administration in time range)  albuterol (VENTOLIN HFA) 108 (90 Base) MCG/ACT inhaler 8 puff (has no administration in time range)    ED Course  I have reviewed the triage vital signs and the nursing notes.  Pertinent labs & imaging results that were available during my care of the patient were reviewed by me and considered in my medical decision making (see chart for details).  Clinical Course as of 02/19/21 1853  Sat Feb 19, 2021  1120 Chest x-ray interpreted by me as COPD no clear infiltrate.  Awaiting radiology reading. [MB]  3016 Patient's oxygen saturation remaining 92% on room air. [MB]    Clinical Course User Index [MB] Hayden Rasmussen, MD   MDM Rules/Calculators/A&P                          NATACIA CHAISSON was evaluated in Emergency Department on 02/19/2021 for the symptoms described in the history of present illness. She was evaluated in the context of the global COVID-19 pandemic, which necessitated consideration that the patient might be at risk for infection with the SARS-CoV-2 virus that causes COVID-19. Institutional protocols and algorithms that pertain to the evaluation of patients at risk for COVID-19 are in a state of rapid change based on information released by regulatory bodies including  the [employer] and federal and state organizations. These policies and algorithms were followed during the patient's care in the ED.  This patient complains of cough and increased work of breathing;  this involves an extensive number of treatment Options and is a complaint that carries with it a high risk of complications and Morbidity. The differential includes COPD, CHF, pneumonia, COVID, flu, pneumothorax  I ordered, reviewed and interpreted labs, which included CBC with normal white count, hemoglobin stable from priors, chemistries with mildly low potassium, COVID and flu negative, D-dimer negative I ordered medication IV magnesium, oral potassium, IV Solu-Medrol I ordered imaging studies which included chest x-ray and I independently    visualized and interpreted imaging which showed no acute findings Additional history obtained from EMS Previous records obtained and reviewed in epic no recent admissions  After the interventions stated above, I reevaluated the patient and found patient be symptomatically improved.  She is having borderline sats on room air.  She otherwise feels well.  Feel she can probably manage this as an outpatient.  Return instructions discussed   Final Clinical Impression(s) / ED Diagnoses Final diagnoses:  COPD exacerbation ([place])    Rx / DC Orders ED Discharge Orders          Ordered    predniSONE (DELTASONE) 10 MG tablet        02/19/21 1400    albuterol (VENTOLIN HFA) 108 (90 Base) MCG/ACT inhaler  Every 6 hours PRN        02/19/21 1400             Hayden Rasmussen, MD 02/19/21 [phone]

## 2021-03-17 ENCOUNTER — Ambulatory Visit: Payer: BC Managed Care – PPO | Admitting: Family Medicine

## 2021-03-17 ENCOUNTER — Encounter: Payer: Self-pay | Admitting: Family Medicine

## 2021-03-17 VITALS — BP 120/82 | HR 111 | Temp 98.2°F | Ht 61.0 in | Wt 120.5 lb

## 2021-03-17 DIAGNOSIS — J441 Chronic obstructive pulmonary disease with (acute) exacerbation: Secondary | ICD-10-CM | POA: Diagnosis not present

## 2021-03-17 DIAGNOSIS — B370 Candidal stomatitis: Secondary | ICD-10-CM | POA: Diagnosis not present

## 2021-03-17 MED ORDER — NYSTATIN 100000 UNIT/ML MT SUSP
5.0000 mL | Freq: Four times a day (QID) | OROMUCOSAL | 0 refills | Status: DC
Start: 2021-03-17 — End: 2022-01-09

## 2021-03-17 MED ORDER — LEVOFLOXACIN 500 MG PO TABS: 500.0000 mg | ORAL_TABLET | Freq: Every day | ORAL | 0 refills | Status: AC

## 2021-03-17 NOTE — Progress Notes (Signed)
Acute Office Visit  Subjective:    Patient ID: Amanda York, female    DOB: 1965-09-13, 55 y.o.   MRN: [id number]  Chief Complaint  Patient presents with   Amanda York    HPI Patient is in today for mouth pain and sore throat x 5 days. She denies fever or cervical adenopathy. She has a yellow-white coating on her tongue. She has been treated with 2 rounds of prednisone in the last month for a COPD exacerbation. She reports that her congestion is improving from this. However her cough continues to be increased with clear sputum and she continues to have shortness of breath with exertions, more so than before her exacerbation. She has been using trelegy ellipta. She has had to use her albuterol inhaler BID. She was treated with cefzil a month ago with mild improvement.    Past Medical History:  Diagnosis Date   Arthritis    Atypical chest pain 10/20/2014   Chest pain 10/19/2014   COPD (chronic obstructive pulmonary disease) ([place])    Hypertension    Hypothyroidism    Tobacco abuse     Past Surgical History:  Procedure Laterality Date   none      Family History  Problem Relation Age of Onset   CAD Father        Died at 8 of [place]   Coronary artery disease Father    CAD Paternal Uncle        Died at 14 of [place]   CAD Paternal Grandfather        Details unclaer   COPD Sister     Social History   Socioeconomic History   Marital status: Married    Spouse name: Not on file   Number of children: Not on file   Years of education: Not on file   Highest education level: Not on file  Occupational History   Not on file  Tobacco Use   Smoking status: Every Day    Packs/day: 1.00    Years: 41.00    Pack years: 41.00    Types: Cigarettes   Smokeless tobacco: Never   Tobacco comments:    Since age 62  Vaping Use   Vaping Use: Never used  Substance and Sexual Activity   Alcohol use: No    Alcohol/week: 0.0 standard drinks   Drug use: No   Sexual activity: Yes  Other Topics  Concern   Not on file  Social History Narrative   Not on file   Social Determinants of Health   Financial Resource Strain: Not on file  Food Insecurity: Not on file  Transportation Needs: Not on file  Physical Activity: Not on file  Stress: Not on file  Social Connections: Not on file  Intimate Partner Violence: Not on file    Outpatient Medications Prior to Visit  Medication Sig Dispense Refill   albuterol (VENTOLIN HFA) 108 (90 Base) MCG/ACT inhaler Inhale 2 puffs into the lungs every 6 (six) hours as needed for wheezing or shortness of breath. 1 each 0   aspirin EC 81 MG EC tablet Take 1 tablet (81 mg total) by mouth daily. 30 tablet 0   busPIRone (BUSPAR) 10 MG tablet TAKE 1 TABLET(10 MG) BY MOUTH THREE TIMES DAILY 270 tablet 03   cetirizine (ZYRTEC) 10 MG tablet Take 1 tablet (10 mg total) by mouth daily. 30 tablet 2   escitalopram (LEXAPRO) 20 MG tablet Take 1 tablet (20 mg total) by mouth daily. 90 tablet  3   Fluticasone-Umeclidin-Vilant (TRELEGY ELLIPTA) 100-62.5-25 MCG/INH AEPB Inhale 1 puff into the lungs daily. 60 each 12   levothyroxine (SYNTHROID) 25 MCG tablet Take 1 tablet (25 mcg total) by mouth daily before breakfast. (Needs to be seen before next refill) 90 tablet 3   lisinopril (ZESTRIL) 10 MG tablet Take 1 tablet (10 mg total) by mouth daily. 90 tablet 3   naproxen (NAPROSYN) 500 MG tablet Take 1 tablet (500 mg total) by mouth 2 (two) times daily with a meal. 30 tablet 0   pantoprazole (PROTONIX) 20 MG tablet Take 1 tablet (20 mg total) by mouth daily. 30 tablet 0   rosuvastatin (CRESTOR) 10 MG tablet Take 1 tablet (10 mg total) by mouth daily. 90 tablet 1   traZODone (DESYREL) 50 MG tablet TAKE 1 TO 1 AND 1/2 TABLETS(50 TO 75 MG) BY MOUTH AT BEDTIME AS NEEDED FOR SLEEP 135 tablet 3   Vitamin D, Ergocalciferol, (DRISDOL) 1.25 MG (50000 UNIT) CAPS capsule Take 1 capsule (50,000 Units total) by mouth every 7 (seven) days. 12 capsule 3   cefPROZIL (CEFZIL) 500 MG  tablet Take 1 tablet (500 mg total) by mouth 2 (two) times daily. For infection. Take all of this medication. 20 tablet 0   predniSONE (DELTASONE) 10 MG tablet 6 tabs for 2 days then 5 tabs for 2 days.  Continue this taper until finished. 42 tablet 0   No facility-administered medications prior to visit.    No Known Allergies  Review of Systems As per HPI.    Objective:    Physical Exam Vitals and nursing note reviewed.  Constitutional:      General: She is not in acute distress.    Appearance: She is not toxic-appearing or diaphoretic.  HENT:     Mouth/Throat:     Pharynx: No pharyngeal swelling, posterior oropharyngeal erythema or uvula swelling.     Tonsils: No tonsillar exudate or tonsillar abscesses. 1+ on the right. 1+ on the left.     Comments: Oral thrush present Cardiovascular:     Rate and Rhythm: Regular rhythm. Tachycardia present.     Heart sounds: Normal heart sounds. No murmur heard. Pulmonary:     Effort: Pulmonary effort is normal. No respiratory distress.     Breath sounds: No stridor. Rhonchi present. No wheezing or rales.  Chest:     Chest wall: No tenderness.  Musculoskeletal:     Right lower leg: No edema.     Left lower leg: No edema.  Skin:    General: Skin is warm and dry.  Neurological:     Mental Status: She is alert and oriented to person, place, and time.  Psychiatric:        Mood and Affect: Mood normal.        Behavior: Behavior normal.    BP 120/82    Pulse (!) 111    Temp 98.2 F (36.8 C) (Temporal)    Ht $R'5\' 1"'Rt$  (1.549 m)    Wt 120 lb 8 oz (54.7 kg)    SpO2 93%    BMI 22.77 kg/m  Wt Readings from Last 3 Encounters:  03/17/21 120 lb 8 oz (54.7 kg)  02/19/21 124 lb (56.2 kg)  10/09/20 125 lb (56.7 kg)    Health Maintenance Due  Topic Date Due   Fecal DNA (Cologuard)  Never done   MAMMOGRAM  10/04/2018   COVID-19 Vaccine (3 - Pfizer risk series) 12/19/2019    There are no preventive care reminders  to display for this  patient.   Lab Results  Component Value Date   TSH 2.160 09/14/2020   Lab Results  Component Value Date   WBC 5.6 02/19/2021   HGB 15.7 (H) 02/19/2021   HCT 45.7 02/19/2021   MCV 88.2 02/19/2021   PLT 237 02/19/2021   Lab Results  Component Value Date   NA 139 02/19/2021   K 3.0 (L) 02/19/2021   CO2 28 02/19/2021   GLUCOSE 101 (H) 02/19/2021   BUN 6 02/19/2021   CREATININE 0.66 02/19/2021   BILITOT 0.6 10/09/2020   ALKPHOS 59 10/09/2020   AST 17 10/09/2020   ALT 13 10/09/2020   PROT 7.2 10/09/2020   ALBUMIN 4.2 10/09/2020   CALCIUM 8.5 (L) 02/19/2021   ANIONGAP 9 02/19/2021   EGFR 102 09/14/2020   Lab Results  Component Value Date   CHOL 261 (H) 09/14/2020   Lab Results  Component Value Date   HDL 69 09/14/2020   Lab Results  Component Value Date   LDLCALC 181 (H) 09/14/2020   Lab Results  Component Value Date   TRIG 67 09/14/2020   Lab Results  Component Value Date   CHOLHDL 3.8 09/14/2020   Lab Results  Component Value Date   HGBA1C 5.7 (H) 10/20/2014       Assessment & Plan:   Nadezhda was seen today for thrush.  Diagnoses and all orders for this visit:  Thrush, oral Nystatin swish and swallow. Handout given.  -     nystatin (MYCOSTATIN) 100000 UNIT/ML suspension; Use as directed 5 mLs (500,000 Units total) in the mouth or throat 4 (four) times daily.  COPD exacerbation ([place]) Has completed cefzil and prednisone without much improvement. Levaquin ordered. Mucinex, hydration.  -     levofloxacin (LEVAQUIN) 500 MG tablet; Take 1 tablet (500 mg total) by mouth daily for 7 days.  The patient indicates understanding of these issues and agrees with the plan.  Gwenlyn Perking, FNP

## 2021-03-17 NOTE — Patient Instructions (Signed)

## 2021-04-08 ENCOUNTER — Telehealth: Payer: Self-pay | Admitting: *Deleted

## 2021-04-08 DIAGNOSIS — M810 Age-related osteoporosis without current pathological fracture: Secondary | ICD-10-CM

## 2021-04-08 NOTE — Telephone Encounter (Signed)
Recommendations: 1.  Start alendronate (FOSAMAX)  2.  recommend calcium 1200mg  daily through supplementation or diet.  3.  recommend weight bearing exercise - 30 minutes at least 4 days per week.   4.  Counseled and educated about fall risk and prevention. 5. Encouraged patient to stop smoking   Recheck DEXA:  2 years    This was last note per Pharm  Pt called stating that ever since she started taking her calcium pill for her arthritis, she has been experiencing a lot of abdominal pain.  Would like her to get set up for Prolia if she is amenable.  Will cc to Prolia pool as well.     PA started today 04/08/21. Key: [id number] Sent to Plan today

## 2021-04-11 NOTE — Telephone Encounter (Signed)
Deniedon January 7 Your request has been denied   Tried Fosamax - may need to try one more more oral, before this can get coverage.

## 2021-04-11 NOTE — Telephone Encounter (Signed)
Margaret with [employer] is going to appeal on this, so hold off for now Almyra Free if you will.

## 2021-04-11 NOTE — Telephone Encounter (Signed)
Could not tolerate bisphosphonate due to UNCONTROLLED GERD even with PPI.  Can you help?

## 2021-05-04 NOTE — Telephone Encounter (Signed)
Received Provider courtesy review approval today for prolia.  Approved for 2 injections in a year time (04/08/2021-04/07/2022)  Reference # [id number]  (Form attached to pt prolia paper in binder)  Asking REP about cost for pt and where to order.

## 2021-05-31 NOTE — Telephone Encounter (Signed)
Per Marg, call into [facility] and patient will need to use the Prolia copay card. Cost will be $25 with the card. I will notify patient of the copay card.

## 2021-06-03 MED ORDER — DENOSUMAB 60 MG/ML ~~LOC~~ SOSY: 60.0000 mg | PREFILLED_SYRINGE | Freq: Once | SUBCUTANEOUS | 1 refills | Status: AC

## 2021-06-03 NOTE — Addendum Note (Signed)
Addended by: Zannie Cove on: 06/03/2021 02:18 PM ? ? Modules accepted: Orders ? ?

## 2021-06-03 NOTE — Telephone Encounter (Signed)
I called and LMTCB for pt ?I have sent this to [facility], although pt usually uses [facility].  ? ?I need to let pt know about pharm change and ask IF she has her co-pay card. ? ? ?

## 2021-07-25 NOTE — Telephone Encounter (Signed)
Left message for patient to verify that she would still like Prolia? If so was she able to pick it up at [facility]. ?

## 2021-10-26 ENCOUNTER — Telehealth: Payer: Self-pay | Admitting: Family Medicine

## 2021-10-26 NOTE — Telephone Encounter (Signed)
Patient states she does not want to proceed with Prolia at this time.

## 2021-10-28 ENCOUNTER — Other Ambulatory Visit: Payer: Self-pay | Admitting: Family

## 2021-12-05 ENCOUNTER — Other Ambulatory Visit: Payer: Self-pay | Admitting: Family Medicine

## 2021-12-05 DIAGNOSIS — F324 Major depressive disorder, single episode, in partial remission: Secondary | ICD-10-CM

## 2021-12-05 DIAGNOSIS — F411 Generalized anxiety disorder: Secondary | ICD-10-CM

## 2021-12-05 DIAGNOSIS — I10 Essential (primary) hypertension: Secondary | ICD-10-CM

## 2021-12-05 DIAGNOSIS — E039 Hypothyroidism, unspecified: Secondary | ICD-10-CM

## 2022-01-09 ENCOUNTER — Other Ambulatory Visit: Payer: Self-pay | Admitting: Family Medicine

## 2022-01-09 ENCOUNTER — Ambulatory Visit (INDEPENDENT_AMBULATORY_CARE_PROVIDER_SITE_OTHER): Payer: BC Managed Care – PPO | Admitting: Family Medicine

## 2022-01-09 ENCOUNTER — Encounter: Payer: Self-pay | Admitting: Family Medicine

## 2022-01-09 ENCOUNTER — Ambulatory Visit
Admission: RE | Admit: 2022-01-09 | Discharge: 2022-01-09 | Disposition: A | Discharge status: Home / Self Care | Payer: Self-pay | Source: Ambulatory Visit | Attending: Family Medicine | Admitting: Family Medicine

## 2022-01-09 VITALS — BP 125/78 | HR 7 | Ht 61.0 in | Wt 135.0 lb

## 2022-01-09 DIAGNOSIS — I10 Essential (primary) hypertension: Secondary | ICD-10-CM | POA: Diagnosis not present

## 2022-01-09 DIAGNOSIS — M810 Age-related osteoporosis without current pathological fracture: Secondary | ICD-10-CM

## 2022-01-09 DIAGNOSIS — Z2821 Immunization not carried out because of patient refusal: Secondary | ICD-10-CM | POA: Diagnosis not present

## 2022-01-09 DIAGNOSIS — Z1231 Encounter for screening mammogram for malignant neoplasm of breast: Secondary | ICD-10-CM

## 2022-01-09 DIAGNOSIS — E7800 Pure hypercholesterolemia, unspecified: Secondary | ICD-10-CM | POA: Diagnosis not present

## 2022-01-09 DIAGNOSIS — Z1211 Encounter for screening for malignant neoplasm of colon: Secondary | ICD-10-CM | POA: Diagnosis not present

## 2022-01-09 DIAGNOSIS — F324 Major depressive disorder, single episode, in partial remission: Secondary | ICD-10-CM

## 2022-01-09 DIAGNOSIS — E559 Vitamin D deficiency, unspecified: Secondary | ICD-10-CM

## 2022-01-09 DIAGNOSIS — Z0001 Encounter for general adult medical examination with abnormal findings: Secondary | ICD-10-CM | POA: Diagnosis not present

## 2022-01-09 DIAGNOSIS — F411 Generalized anxiety disorder: Secondary | ICD-10-CM

## 2022-01-09 DIAGNOSIS — J449 Chronic obstructive pulmonary disease, unspecified: Secondary | ICD-10-CM

## 2022-01-09 DIAGNOSIS — E039 Hypothyroidism, unspecified: Secondary | ICD-10-CM | POA: Diagnosis not present

## 2022-01-09 DIAGNOSIS — Z0000 Encounter for general adult medical examination without abnormal findings: Secondary | ICD-10-CM

## 2022-01-09 MED ORDER — ROSUVASTATIN CALCIUM 10 MG PO TABS: 10.0000 mg | ORAL_TABLET | Freq: Every day | ORAL | 3 refills | Status: DC

## 2022-01-09 MED ORDER — LISINOPRIL 10 MG PO TABS: 10.0000 mg | ORAL_TABLET | Freq: Every day | ORAL | 3 refills | Status: DC

## 2022-01-09 MED ORDER — VITAMIN D (ERGOCALCIFEROL) 1.25 MG (50000 UNIT) PO CAPS: 50000.0000 [IU] | ORAL_CAPSULE | ORAL | 3 refills | Status: DC

## 2022-01-09 MED ORDER — LEVOTHYROXINE SODIUM 25 MCG PO TABS: 25.0000 ug | ORAL_TABLET | Freq: Every day | ORAL | 3 refills | Status: DC

## 2022-01-09 MED ORDER — ESCITALOPRAM OXALATE 20 MG PO TABS: 20.0000 mg | ORAL_TABLET | Freq: Every day | ORAL | 3 refills | Status: DC

## 2022-01-09 MED ORDER — ALBUTEROL SULFATE HFA 108 (90 BASE) MCG/ACT IN AERS: 2.0000 | INHALATION_SPRAY | Freq: Four times a day (QID) | RESPIRATORY_TRACT | 0 refills | Status: DC | PRN

## 2022-01-09 NOTE — Progress Notes (Signed)
Amanda York is a 56 y.o. female presents to office today for annual physical exam examination.    Concerns today include: 1.  None.  She is doing well.  She has discontinued BuSpar and trazodone because she did not find them to be especially helpful nor did she find an ongoing need for them.  She continues to care for her 2 grandchildren, ages 21 and 3.  Both have ADHD and 1 has a learning disability but she is navigating through these issues with the aid of speech therapy and other interventions from their school.  She has been doing breathing exercises at home in efforts to improve lung function and is totally compliant with all medicines as prescribed   Diet: Balanced, Exercise: Stays active with her grandchildren Last colonoscopy: Uses Cologuard Last mammogram: Needs Last pap smear: Not due Refills needed today: Needs all Immunizations needed: Declines flu Immunization History  Administered Date(s) Administered   PFIZER(Purple Top)SARS-COV-2 Vaccination 10/31/2019, 11/21/2019   Tdap 05/21/2018   Zoster Recombinat (Shingrix) 05/21/2018, 12/25/2018     Past Medical History:  Diagnosis Date   Arthritis    Atypical chest pain 10/20/2014   Chest pain 10/19/2014   COPD (chronic obstructive pulmonary disease) ([place])    Hypertension    Hypothyroidism    Tobacco abuse    Social History   Socioeconomic History   Marital status: Married    Spouse name: Not on file   Number of children: Not on file   Years of education: Not on file   Highest education level: Not on file  Occupational History   Not on file  Tobacco Use   Smoking status: Every Day    Packs/day: 1.00    Years: 41.00    Total pack years: 41.00    Types: Cigarettes   Smokeless tobacco: Never   Tobacco comments:    Since age 45  Vaping Use   Vaping Use: Never used  Substance and Sexual Activity   Alcohol use: No    Alcohol/week: 0.0 standard drinks of alcohol   Drug use: No   Sexual activity: Yes  Other  Topics Concern   Not on file  Social History Narrative   Not on file   Social Determinants of Health   Financial Resource Strain: Not on file  Food Insecurity: Not on file  Transportation Needs: Not on file  Physical Activity: Not on file  Stress: Not on file  Social Connections: Not on file  Intimate Partner Violence: Not on file   Past Surgical History:  Procedure Laterality Date   none     Family History  Problem Relation Age of Onset   CAD Father        Died at 11 of [place]   Coronary artery disease Father    CAD Paternal Uncle        Died at 59 of [place]   CAD Paternal Grandfather        Details unclaer   COPD Sister     Current Outpatient Medications:    aspirin EC 81 MG EC tablet, Take 1 tablet (81 mg total) by mouth daily., Disp: 30 tablet, Rfl: 0   cetirizine (ZYRTEC) 10 MG tablet, Take 1 tablet (10 mg total) by mouth daily., Disp: 30 tablet, Rfl: 2   Fluticasone-Umeclidin-Vilant (TRELEGY ELLIPTA) 100-62.5-25 MCG/INH AEPB, Inhale 1 puff into the lungs daily., Disp: 60 each, Rfl: 12   albuterol (VENTOLIN HFA) 108 (90 Base) MCG/ACT inhaler, Inhale 2 puffs into the  lungs every 6 (six) hours as needed for wheezing or shortness of breath., Disp: 1 each, Rfl: 0   escitalopram (LEXAPRO) 20 MG tablet, Take 1 tablet (20 mg total) by mouth daily., Disp: 90 tablet, Rfl: 3   levothyroxine (SYNTHROID) 25 MCG tablet, Take 1 tablet (25 mcg total) by mouth daily before breakfast., Disp: 90 tablet, Rfl: 3   lisinopril (ZESTRIL) 10 MG tablet, Take 1 tablet (10 mg total) by mouth daily., Disp: 90 tablet, Rfl: 3   pantoprazole (PROTONIX) 20 MG tablet, Take 1 tablet (20 mg total) by mouth daily., Disp: 30 tablet, Rfl: 0   rosuvastatin (CRESTOR) 10 MG tablet, Take 1 tablet (10 mg total) by mouth daily., Disp: 90 tablet, Rfl: 3   Vitamin D, Ergocalciferol, (DRISDOL) 1.25 MG (50000 UNIT) CAPS capsule, Take 1 capsule (50,000 Units total) by mouth every 7 (seven) days., Disp: 12 capsule, Rfl:  3  Allergies  Allergen Reactions   Alendronate     Reported "internal" allergy. ?cause but this was the only thing started around the time she was evaluated in ER.     ROS: Review of Systems A comprehensive review of systems was negative except for: Ears, nose, mouth, throat, and face: positive for allergic rhinitis Respiratory: positive for chronic bronchitis Behavioral/Psych: positive for anxiety and depression    Physical exam BP 125/78   Pulse (!) 7   Ht 5' 1"  (1.549 m)   Wt 135 lb (61.2 kg)   SpO2 95%   BMI 25.51 kg/m  General appearance: alert, cooperative, appears stated age, and no distress Head: Normocephalic, without obvious abnormality, atraumatic Eyes: negative findings: lids and lashes normal, conjunctivae and sclerae normal, corneas clear, and pupils equal, round, reactive to light and accomodation Ears: normal TM's and external ear canals both ears Nose: Nares normal. Septum midline. Mucosa normal. No drainage or sinus tenderness. Throat:  Oropharynx without erythema or masses.  She has false uppers Neck: no adenopathy, supple, symmetrical, trachea midline, and thyroid not enlarged, symmetric, no tenderness/mass/nodules Back: symmetric, no curvature. ROM normal. No CVA tenderness. Lungs:  Globally decreased breath sounds with normal work of breathing on room air.  She has mild rhonchi noted at the apexes bilaterally Heart: regular rate and rhythm, S1, S2 normal, no murmur, click, rub or gallop Abdomen: soft, non-tender; bowel sounds normal; no masses,  no organomegaly Extremities: extremities normal, atraumatic, no cyanosis or edema Pulses: 2+ and symmetric Skin: Skin color, texture, turgor normal. No rashes or lesions Lymph nodes: Cervical, supraclavicular, and axillary nodes normal. Neurologic: Grossly normal Psych: Mood stable, speech normal, affect appropriate     01/09/2022    9:24 AM 03/17/2021    8:58 AM 09/14/2020   11:17 AM  Depression screen PHQ 2/9   Decreased Interest 1 0 0  Down, Depressed, Hopeless 0 0 0  PHQ - 2 Score 1 0 0  Altered sleeping 0 0 0  Tired, decreased energy 1 0 0  Change in appetite 0 0 0  Feeling bad or failure about yourself  0 0 0  Trouble concentrating 1 0 0  Moving slowly or fidgety/restless 0 0 0  Suicidal thoughts 0 0 0  PHQ-9 Score 3 0 0  Difficult doing work/chores Not difficult at all Not difficult at all Not difficult at all      01/09/2022    9:25 AM 03/17/2021    8:59 AM 09/14/2020   11:17 AM 08/20/2019    1:05 PM  GAD 7 : Generalized Anxiety Score  Nervous, Anxious, on Edge 1 0 0 1  Control/stop worrying 0 0 0 1  Worry too much - different things 0 0 0 1  Trouble relaxing 0 0 0 1  Restless 0 0 0 1  Easily annoyed or irritable 0 0 0 2  Afraid - awful might happen 0 0 0 1  Total GAD 7 Score 1 0 0 8  Anxiety Difficulty Not difficult at all Not difficult at all Not difficult at all     Assessment/ Plan: Amanda York here for annual physical exam.   Annual physical exam  Colon cancer screening - Plan: Cologuard  Influenza vaccine refused  Vitamin D deficiency - Plan: CMP14+EGFR, VITAMIN D 25 Hydroxy (Vit-D Deficiency, Fractures), Vitamin D, Ergocalciferol, (DRISDOL) 1.25 MG (50000 UNIT) CAPS capsule  Hypothyroidism, unspecified type - Plan: CMP14+EGFR, TSH, T4, Free, levothyroxine (SYNTHROID) 25 MCG tablet  Pure hypercholesterolemia - Plan: CMP14+EGFR, Lipid Panel, rosuvastatin (CRESTOR) 10 MG tablet  Essential hypertension - Plan: CMP14+EGFR, lisinopril (ZESTRIL) 10 MG tablet  Chronic obstructive pulmonary disease, unspecified COPD type ([facility]) - Plan: CBC, albuterol (VENTOLIN HFA) 108 (90 Base) MCG/ACT inhaler  Osteoporosis without current pathological fracture, unspecified osteoporosis type - Plan: CMP14+EGFR, VITAMIN D 25 Hydroxy (Vit-D Deficiency, Fractures), Vitamin D, Ergocalciferol, (DRISDOL) 1.25 MG (50000 UNIT) CAPS capsule  Generalized anxiety disorder - Plan: escitalopram  (LEXAPRO) 20 MG tablet  Major depressive disorder with single episode, in partial remission ([place]) - Plan: escitalopram (LEXAPRO) 20 MG tablet  Declines vaccinations.  She will get her mammogram done on the mobile mammogram bus today.  Cologuard ordered.  No contraindications to use for colon cancer screening  Given known vitamin D deficiency and osteoporosis I have renewed her vitamin D.  Thyroid levels ordered.  Continue Synthroid.  Asymptomatic  Check fasting lipid.  Continue statin  Blood pressure well controlled.  ACE inhibitor renewed.  Check renal function, electrolytes  COPD is chronic and stable.  Have given her 3 samples of the Trelegy and renewed her albuterol inhaler.  Not yet due for DEXA scan.  Unfortunately had intolerance to bisphosphonate and could not afford Prolia so maintaining with just vitamin D and calcium for now.  Continue adequate nutrition and weightbearing exercises  Anxiety and depressive disorder is chronic and stable.  Continue Lexapro  Counseled on healthy lifestyle choices, including diet (rich in fruits, vegetables and lean meats and low in salt and simple carbohydrates) and exercise (at least 30 minutes of moderate physical activity daily).  Patient to follow up in 56mfor thyroid check  Meshia Rau M. GLajuana Ripple DO

## 2022-01-09 NOTE — Patient Instructions (Signed)
Preventive Care 40-57 Years Old, Female Preventive care refers to lifestyle choices and visits with your health care provider that can promote health and wellness. Preventive care visits are also called wellness exams. What can I expect for my preventive care visit? Counseling Your health care provider may ask you questions about your: Medical history, including: Past medical problems. Family medical history. Pregnancy history. Current health, including: Menstrual cycle. Method of birth control. Emotional well-being. Home life and relationship well-being. Sexual activity and sexual health. Lifestyle, including: Alcohol, nicotine or tobacco, and drug use. Access to firearms. Diet, exercise, and sleep habits. Work and work environment. Sunscreen use. Safety issues such as seatbelt and bike helmet use. Physical exam Your health care provider will check your: Height and weight. These may be used to calculate your BMI (body mass index). BMI is a measurement that tells if you are at a healthy weight. Waist circumference. This measures the distance around your waistline. This measurement also tells if you are at a healthy weight and may help predict your risk of certain diseases, such as type 2 diabetes and high blood pressure. Heart rate and blood pressure. Body temperature. Skin for abnormal spots. What immunizations do I need?  Vaccines are usually given at various ages, according to a schedule. Your health care provider will recommend vaccines for you based on your age, medical history, and lifestyle or other factors, such as travel or where you work. What tests do I need? Screening Your health care provider may recommend screening tests for certain conditions. This may include: Lipid and cholesterol levels. Diabetes screening. This is done by checking your blood sugar (glucose) after you have not eaten for a while (fasting). Pelvic exam and Pap test. Hepatitis B test. Hepatitis C  test. HIV (human immunodeficiency virus) test. STI (sexually transmitted infection) testing, if you are at risk. Lung cancer screening. Colorectal cancer screening. Mammogram. Talk with your health care provider about when you should start having regular mammograms. This may depend on whether you have a family history of breast cancer. BRCA-related cancer screening. This may be done if you have a family history of breast, ovarian, tubal, or peritoneal cancers. Bone density scan. This is done to screen for osteoporosis. Talk with your health care provider about your test results, treatment options, and if necessary, the need for more tests. Follow these instructions at home: Eating and drinking  Eat a diet that includes fresh fruits and vegetables, whole grains, lean protein, and low-fat dairy products. Take vitamin and mineral supplements as recommended by your health care provider. Do not drink alcohol if: Your health care provider tells you not to drink. You are pregnant, may be pregnant, or are planning to become pregnant. If you drink alcohol: Limit how much you have to 0-1 drink a day. Know how much alcohol is in your drink. In the [place] one drink equals one 12 oz bottle of beer (355 mL), one 5 oz glass of wine (148 mL), or one 1 oz glass of hard liquor (44 mL). Lifestyle Brush your teeth every morning and night with fluoride toothpaste. Floss one time each day. Exercise for at least 30 minutes 5 or more days each week. Do not use any products that contain nicotine or tobacco. These products include cigarettes, chewing tobacco, and vaping devices, such as e-cigarettes. If you need help quitting, ask your health care provider. Do not use drugs. If you are sexually active, practice safe sex. Use a condom or other form of protection to   prevent STIs. If you do not wish to become pregnant, use a form of birth control. If you plan to become pregnant, see your health care provider for a  prepregnancy visit. Take aspirin only as told by your health care provider. Make sure that you understand how much to take and what form to take. Work with your health care provider to find out whether it is safe and beneficial for you to take aspirin daily. Find healthy ways to manage stress, such as: Meditation, yoga, or listening to music. Journaling. Talking to a trusted person. Spending time with friends and family. Minimize exposure to UV radiation to reduce your risk of skin cancer. Safety Always wear your seat belt while driving or riding in a vehicle. Do not drive: If you have been drinking alcohol. Do not ride with someone who has been drinking. When you are tired or distracted. While texting. If you have been using any mind-altering substances or drugs. Wear a helmet and other protective equipment during sports activities. If you have firearms in your house, make sure you follow all gun safety procedures. Seek help if you have been physically or sexually abused. What's next? Visit your health care provider once a year for an annual wellness visit. Ask your health care provider how often you should have your eyes and teeth checked. Stay up to date on all vaccines. This information is not intended to replace advice given to you by your health care provider. Make sure you discuss any questions you have with your health care provider. Document Revised: 09/15/2020 Document Reviewed: 09/15/2020 Elsevier Patient Education  [place].

## 2022-01-10 LAB — VITAMIN D 25 HYDROXY (VIT D DEFICIENCY, FRACTURES): Vit D, 25-Hydroxy: 49 ng/mL (ref 30.0–100.0)

## 2022-01-10 LAB — LIPID PANEL
Chol/HDL Ratio: 3.2 ratio (ref 0.0–4.4)
Cholesterol, Total: 253 mg/dL — ABNORMAL HIGH (ref 100–199)
HDL: 80 mg/dL
LDL Chol Calc (NIH): 154 mg/dL — ABNORMAL HIGH (ref 0–99)
Triglycerides: 109 mg/dL (ref 0–149)
VLDL Cholesterol Cal: 19 mg/dL (ref 5–40)

## 2022-01-10 LAB — CMP14+EGFR
ALT: 7 [IU]/L (ref 0–32)
AST: 11 [IU]/L (ref 0–40)
Albumin/Globulin Ratio: 2.1 (ref 1.2–2.2)
Albumin: 4.5 g/dL (ref 3.8–4.9)
Alkaline Phosphatase: 70 [IU]/L (ref 44–121)
BUN/Creatinine Ratio: 7 — ABNORMAL LOW (ref 9–23)
BUN: 4 mg/dL — ABNORMAL LOW (ref 6–24)
Bilirubin Total: 0.5 mg/dL (ref 0.0–1.2)
CO2: 24 mmol/L (ref 20–29)
Calcium: 9.6 mg/dL (ref 8.7–10.2)
Chloride: 97 mmol/L (ref 96–106)
Creatinine, Ser: 0.61 mg/dL (ref 0.57–1.00)
Globulin, Total: 2.1 g/dL (ref 1.5–4.5)
Glucose: 76 mg/dL (ref 70–99)
Potassium: 3.8 mmol/L (ref 3.5–5.2)
Sodium: 135 mmol/L (ref 134–144)
Total Protein: 6.6 g/dL (ref 6.0–8.5)
eGFR: 104 mL/min/{1.73_m2}

## 2022-01-10 LAB — TSH: TSH: 1.57 u[IU]/mL (ref 0.450–4.500)

## 2022-01-10 LAB — T4, FREE: Free T4: 1.19 ng/dL (ref 0.82–1.77)

## 2022-01-10 LAB — CBC
Hematocrit: 48.5 % — ABNORMAL HIGH (ref 34.0–46.6)
Hemoglobin: 16.5 g/dL — ABNORMAL HIGH (ref 11.1–15.9)
MCH: 29.4 pg (ref 26.6–33.0)
MCHC: 34 g/dL (ref 31.5–35.7)
MCV: 86 fL (ref 79–97)
Platelets: 243 10*3/uL (ref 150–450)
RBC: 5.62 x10E6/uL — ABNORMAL HIGH (ref 3.77–5.28)
RDW: 13.2 % (ref 11.7–15.4)
WBC: 7.1 10*3/uL (ref 3.4–10.8)

## 2022-03-31 ENCOUNTER — Other Ambulatory Visit: Payer: Self-pay | Admitting: Family Medicine

## 2022-03-31 DIAGNOSIS — Z720 Tobacco use: Secondary | ICD-10-CM

## 2022-03-31 DIAGNOSIS — J449 Chronic obstructive pulmonary disease, unspecified: Secondary | ICD-10-CM

## 2022-09-15 ENCOUNTER — Other Ambulatory Visit: Payer: Self-pay | Admitting: Family Medicine

## 2022-09-15 DIAGNOSIS — J449 Chronic obstructive pulmonary disease, unspecified: Secondary | ICD-10-CM

## 2022-09-15 DIAGNOSIS — Z720 Tobacco use: Secondary | ICD-10-CM

## 2022-10-13 ENCOUNTER — Other Ambulatory Visit: Payer: Self-pay | Admitting: Family Medicine

## 2022-10-13 DIAGNOSIS — J449 Chronic obstructive pulmonary disease, unspecified: Secondary | ICD-10-CM

## 2022-10-13 DIAGNOSIS — Z720 Tobacco use: Secondary | ICD-10-CM

## 2022-11-10 ENCOUNTER — Other Ambulatory Visit: Payer: Self-pay | Admitting: Family Medicine

## 2022-11-10 DIAGNOSIS — J449 Chronic obstructive pulmonary disease, unspecified: Secondary | ICD-10-CM

## 2022-11-10 DIAGNOSIS — Z720 Tobacco use: Secondary | ICD-10-CM

## 2022-11-13 ENCOUNTER — Encounter: Payer: Self-pay | Admitting: Family Medicine

## 2022-11-13 NOTE — Telephone Encounter (Signed)
[facility]ottsch[place] pt NTBS 30-d given 10/13/22

## 2022-11-13 NOTE — Telephone Encounter (Signed)
LMTCB to schedule appt Letter mailed 

## 2022-12-07 ENCOUNTER — Other Ambulatory Visit: Payer: Self-pay | Admitting: Family Medicine

## 2022-12-07 DIAGNOSIS — Z720 Tobacco use: Secondary | ICD-10-CM

## 2022-12-07 DIAGNOSIS — J449 Chronic obstructive pulmonary disease, unspecified: Secondary | ICD-10-CM

## 2022-12-07 DIAGNOSIS — E7800 Pure hypercholesterolemia, unspecified: Secondary | ICD-10-CM

## 2022-12-07 DIAGNOSIS — F324 Major depressive disorder, single episode, in partial remission: Secondary | ICD-10-CM

## 2022-12-07 DIAGNOSIS — F411 Generalized anxiety disorder: Secondary | ICD-10-CM

## 2022-12-07 DIAGNOSIS — E039 Hypothyroidism, unspecified: Secondary | ICD-10-CM

## 2022-12-07 DIAGNOSIS — I10 Essential (primary) hypertension: Secondary | ICD-10-CM

## 2022-12-07 NOTE — Telephone Encounter (Signed)
  Prescription Request  12/07/2022  What is the name of the medication or equipment? ALL  Have you contacted your pharmacy to request a refill? YES  Which pharmacy would you like this sent to? [facility] [place]  Pt is scheduled to see PCP in December (first available) for med refill. Needs refills sent in to last her until her appt.    Patient notified that their request is being sent to the clinical staff for review and that they should receive a response within 2 business days.

## 2022-12-08 ENCOUNTER — Other Ambulatory Visit: Payer: Self-pay | Admitting: Family Medicine

## 2022-12-08 MED ORDER — LISINOPRIL 10 MG PO TABS: 10.0000 mg | ORAL_TABLET | Freq: Every day | ORAL | 1 refills | Status: DC

## 2022-12-08 MED ORDER — PANTOPRAZOLE SODIUM 20 MG PO TBEC: 20.0000 mg | DELAYED_RELEASE_TABLET | Freq: Every day | ORAL | 1 refills | Status: DC

## 2022-12-08 MED ORDER — ALBUTEROL SULFATE HFA 108 (90 BASE) MCG/ACT IN AERS: 2.0000 | INHALATION_SPRAY | Freq: Four times a day (QID) | RESPIRATORY_TRACT | 1 refills | Status: DC | PRN

## 2022-12-08 MED ORDER — ROSUVASTATIN CALCIUM 10 MG PO TABS: 10.0000 mg | ORAL_TABLET | Freq: Every day | ORAL | 1 refills | Status: DC

## 2022-12-08 MED ORDER — TRELEGY ELLIPTA 100-62.5-25 MCG/ACT IN AEPB: 1.0000 | INHALATION_SPRAY | Freq: Every day | RESPIRATORY_TRACT | 1 refills | Status: DC

## 2022-12-08 MED ORDER — ESCITALOPRAM OXALATE 20 MG PO TABS: 20.0000 mg | ORAL_TABLET | Freq: Every day | ORAL | 1 refills | Status: DC

## 2022-12-08 MED ORDER — LEVOTHYROXINE SODIUM 25 MCG PO TABS: 25.0000 ug | ORAL_TABLET | Freq: Every day | ORAL | 1 refills | Status: DC

## 2022-12-26 ENCOUNTER — Encounter: Payer: Self-pay | Admitting: Nurse Practitioner

## 2022-12-26 ENCOUNTER — Ambulatory Visit: Payer: BC Managed Care – PPO | Admitting: Nurse Practitioner

## 2022-12-26 VITALS — BP 133/78 | HR 101 | Temp 98.4°F | Ht 61.0 in | Wt 140.0 lb

## 2022-12-26 DIAGNOSIS — U071 COVID-19: Secondary | ICD-10-CM | POA: Insufficient documentation

## 2022-12-26 MED ORDER — NIRMATRELVIR/RITONAVIR (PAXLOVID)TABLET: 3.0000 | ORAL_TABLET | Freq: Two times a day (BID) | ORAL | 0 refills | Status: AC

## 2022-12-26 NOTE — Progress Notes (Signed)
Acute Office Visit  Subjective:     Patient ID: Amanda York, female    DOB: 13-Oct-1965, 57 y.o.   MRN: [id number]  Chief Complaint  Patient presents with   Covid Positive    Started feeling bad yesterday, body aches, fever, congestion, cough. At home covid test positive this morning     HPI SUBJECTIVE:  Amanda York is a 57 y.o. female seen today as an acute visit who complains of productive cough, nausea, vomiting, myalgias, and fever for 2 days, relieve with ibuprofen temp at home 101.5. She denies a history of sweats and weight loss and has and COPD a history of asthma. Patient admits to smoke cigarettes 1-pack daily. She took at home COVID test this morning and it was positive. Last eGFR from 01/09/2022 104  Active Ambulatory Problems    Diagnosis Date Noted   Hypertension 10/19/2014   COPD (chronic obstructive pulmonary disease) (HCC) 10/20/2014   Tobacco abuse 10/20/2014   Depression 04/28/2015   Generalized anxiety disorder 06/04/2018   Vitamin D deficiency 10/07/2020   Positive self-administered antigen test for COVID-19 12/26/2022   Resolved Ambulatory Problems    Diagnosis Date Noted   Chest pain 10/19/2014   Atypical chest pain 10/20/2014   Past Medical History:  Diagnosis Date   Arthritis    Hypothyroidism     Review of Systems  Constitutional:  Positive for chills, fever and malaise/fatigue.  HENT:  Negative for congestion and sore throat.   Eyes:  Negative for pain and redness.  Respiratory:  Positive for cough, shortness of breath and wheezing.        Has a dx of COPD  Cardiovascular:  Negative for chest pain and leg swelling.  Gastrointestinal:  Positive for nausea and vomiting. Negative for diarrhea and melena.  Genitourinary:  Negative for hematuria and urgency.  Musculoskeletal:  Positive for myalgias. Negative for falls.  Skin:  Negative for itching and rash.  Neurological:  Positive for headaches. Negative for dizziness.   Endo/Heme/Allergies:  Negative for environmental allergies and polydipsia. Does not bruise/bleed easily.  Psychiatric/Behavioral:  Negative for suicidal ideas. The patient does not have insomnia.    Negative unless indicated in HPI    Objective:    BP 133/78   Pulse (!) 101   Temp 98.4 F (36.9 C) (Temporal)   Ht 5\' 1"  (1.549 m)   Wt 140 lb (63.5 kg)   SpO2 90%   BMI 26.45 kg/m  BP Readings from Last 3 Encounters:  12/26/22 133/78  01/09/22 125/78  03/17/21 120/82   Wt Readings from Last 3 Encounters:  12/26/22 140 lb (63.5 kg)  01/09/22 135 lb (61.2 kg)  03/17/21 120 lb 8 oz (54.7 kg)    Physical Exam Vitals and nursing note reviewed.  Constitutional:      Appearance: Normal appearance.  HENT:     Head: Normocephalic and atraumatic.  Eyes:     Extraocular Movements: Extraocular movements intact.     Conjunctiva/sclera: Conjunctivae normal.     Pupils: Pupils are equal, round, and reactive to light.  Cardiovascular:     Rate and Rhythm: Tachycardia present.  Pulmonary:     Effort: Pulmonary effort is normal.     Breath sounds: Wheezing present.  Skin:    General: Skin is warm and dry.     Capillary Refill: Capillary refill takes less than 2 seconds.     Coloration: Skin is not jaundiced.     Findings: No rash.  Neurological:  Mental Status: She is alert and oriented to person, place, and time. Mental status is at baseline.  Psychiatric:        Mood and Affect: Mood normal.        Behavior: Behavior normal.        Thought Content: Thought content normal.        Judgment: Judgment normal.    No results found for any visits on 12/26/22.      Assessment & Plan:  Positive self-administered antigen test for COVID-19 -     nirmatrelvir/ritonavir; Take 3 tablets by mouth 2 (two) times daily for 5 days. Patient GFR is 109. Take nirmatrelvir (150 mg) two tablets twice daily for 5 days and ritonavir (100 mg) one tablet twice daily for 5 days.  Dispense: 30  tablet; Refill: 0   Amanda York is 57 yrs old female COVID+ Last eGFR from 01/09/2022 104, will treat her with Paxlovid She is instructed to get a pulse oximeter to monitor oxygen level. If oxygen remains below 90%, not improved with her rescue inhaler, need to go to ED. - increase hydration, tylenol/for fever Rest and follow new CDC guidelines for isolation protocols She verbalized understanding   The above assessment and management plan was discussed with the patient. The patient verbalized understanding of and has agreed to the management plan. Patient is aware to call the clinic if they develop any new symptoms or if symptoms persist or worsen. Patient is aware when to return to the clinic for a follow-up visit. Patient educated on when it is appropriate to go to the emergency department.       Person Under Monitoring Name: Amanda York  Location: [address] [place] [address]   Infection Prevention Recommendations for Individuals Confirmed to have, or Being Evaluated for, 2019 Novel Coronavirus (COVID-19) Infection Who Receive Care at Home  Individuals who are confirmed to have, or are being evaluated for, COVID-19 should follow the prevention steps below until a healthcare provider or local or state health department says they can return to normal activities.  Stay home except to get medical care You should restrict activities outside your home, except for getting medical care. Do not go to work, school, or public areas, and do not use public transportation or taxis.  Call ahead before visiting your doctor Before your medical appointment, call the healthcare provider and tell them that you have, or are being evaluated for, COVID-19 infection. This will help the healthcare provider's office take steps to keep other people from getting infected. Ask your healthcare provider to call the local or state health department.  Monitor your symptoms Seek prompt medical  attention if your illness is worsening (e.g., difficulty breathing). Before going to your medical appointment, call the healthcare provider and tell them that you have, or are being evaluated for, COVID-19 infection. Ask your healthcare provider to call the local or state health department.  Wear a facemask You should wear a facemask that covers your nose and mouth when you are in the same room with other people and when you visit a healthcare provider. People who live with or visit you should also wear a facemask while they are in the same room with you.  Separate yourself from other people in your home As much as possible, you should stay in a different room from other people in your home. Also, you should use a separate bathroom, if available.  Avoid sharing household items You should not share dishes, drinking  glasses, cups, eating utensils, towels, bedding, or other items with other people in your home. After using these items, you should wash them thoroughly with soap and water.  Cover your coughs and sneezes Cover your mouth and nose with a tissue when you cough or sneeze, or you can cough or sneeze into your sleeve. Throw used tissues in a lined trash can, and immediately wash your hands with soap and water for at least 20 seconds or use an alcohol-based hand rub.  Wash your [employer] your hands often and thoroughly with soap and water for at least 20 seconds. You can use an alcohol-based hand sanitizer if soap and water are not available and if your hands are not visibly dirty. Avoid touching your eyes, nose, and mouth with unwashed hands.   Prevention Steps for Caregivers and Household Members of Individuals Confirmed to have, or Being Evaluated for, COVID-19 Infection Being Cared for in the Home  If you live with, or provide care at home for, a person confirmed to have, or being evaluated for, COVID-19 infection please follow these guidelines to prevent  infection:  Follow healthcare provider's instructions Make sure that you understand and can help the patient follow any healthcare provider instructions for all care.  Provide for the patient's basic needs You should help the patient with basic needs in the home and provide support for getting groceries, prescriptions, and other personal needs.  Monitor the patient's symptoms If they are getting sicker, call his or her medical provider and tell them that the patient has, or is being evaluated for, COVID-19 infection. This will help the healthcare provider's office take steps to keep other people from getting infected. Ask the healthcare provider to call the local or state health department.  Limit the number of people who have contact with the patient If possible, have only one caregiver for the patient. Other household members should stay in another home or place of residence. If this is not possible, they should stay in another room, or be separated from the patient as much as possible. Use a separate bathroom, if available. Restrict visitors who do not have an essential need to be in the home.  Keep older adults, very young children, and other sick people away from the patient Keep older adults, very young children, and those who have compromised immune systems or chronic health conditions away from the patient. This includes people with chronic heart, lung, or kidney conditions, diabetes, and cancer.  Ensure good ventilation Make sure that shared spaces in the home have good air flow, such as from an air conditioner or an opened window, weather permitting.  Wash your hands often Wash your hands often and thoroughly with soap and water for at least 20 seconds. You can use an alcohol based hand sanitizer if soap and water are not available and if your hands are not visibly dirty. Avoid touching your eyes, nose, and mouth with unwashed hands. Use disposable paper towels to dry your  hands. If not available, use dedicated cloth towels and replace them when they become wet.  Wear a facemask and gloves Wear a disposable facemask at all times in the room and gloves when you touch or have contact with the patient's blood, body fluids, and/or secretions or excretions, such as sweat, saliva, sputum, nasal mucus, vomit, urine, or feces.  Ensure the mask fits over your nose and mouth tightly, and do not touch it during use. Throw out disposable facemasks and gloves after using them.  Do not reuse. Wash your hands immediately after removing your facemask and gloves. If your personal clothing becomes contaminated, carefully remove clothing and launder. Wash your hands after handling contaminated clothing. Place all used disposable facemasks, gloves, and other waste in a lined container before disposing them with other household waste. Remove gloves and wash your hands immediately after handling these items.  Do not share dishes, glasses, or other household items with the patient Avoid sharing household items. You should not share dishes, drinking glasses, cups, eating utensils, towels, bedding, or other items with a patient who is confirmed to have, or being evaluated for, COVID-19 infection. After the person uses these items, you should wash them thoroughly with soap and water.  Wash laundry thoroughly Immediately remove and wash clothes or bedding that have blood, body fluids, and/or secretions or excretions, such as sweat, saliva, sputum, nasal mucus, vomit, urine, or feces, on them. Wear gloves when handling laundry from the patient. Read and follow directions on labels of laundry or clothing items and detergent. In general, wash and dry with the warmest temperatures recommended on the label.  Clean all areas the individual has used often Clean all touchable surfaces, such as counters, tabletops, doorknobs, bathroom fixtures, toilets, phones, keyboards, tablets, and bedside tables,  every day. Also, clean any surfaces that may have blood, body fluids, and/or secretions or excretions on them. Wear gloves when cleaning surfaces the patient has come in contact with. Use a diluted bleach solution (e.g., dilute bleach with 1 part bleach and 10 parts water) or a household disinfectant with a label that says EPA-registered for coronaviruses. To make a bleach solution at home, add 1 tablespoon of bleach to 1 quart (4 cups) of water. For a larger supply, add  cup of bleach to 1 gallon (16 cups) of water. Read labels of cleaning products and follow recommendations provided on product labels. Labels contain instructions for safe and effective use of the cleaning product including precautions you should take when applying the product, such as wearing gloves or eye protection and making sure you have good ventilation during use of the product. Remove gloves and wash hands immediately after cleaning.  Monitor yourself for signs and symptoms of illness Caregivers and household members are considered close contacts, should monitor their health, and will be asked to limit movement outside of the home to the extent possible. Follow the monitoring steps for close contacts listed on the symptom monitoring form.   ? If you have additional questions, contact your local health department or call the epidemiologist on call at [phone] (available 24/7). ? This guidance is subject to change. For the most up-to-date guidance from [facility], please refer to their website: [email or web address]   Return if symptoms worsen or fail to improve.  Arrie Aran Santa Lighter, DNP [facility] [address] [place], [place] [address] [phone]

## 2023-01-18 ENCOUNTER — Other Ambulatory Visit: Payer: Self-pay | Admitting: Family Medicine

## 2023-01-18 DIAGNOSIS — J449 Chronic obstructive pulmonary disease, unspecified: Secondary | ICD-10-CM

## 2023-03-26 ENCOUNTER — Encounter: Payer: Self-pay | Admitting: Family Medicine

## 2023-03-26 ENCOUNTER — Ambulatory Visit: Payer: BC Managed Care – PPO | Admitting: Family Medicine

## 2023-03-26 VITALS — BP 124/83 | HR 88 | Temp 98.4°F | Ht 61.0 in | Wt 138.0 lb

## 2023-03-26 DIAGNOSIS — E559 Vitamin D deficiency, unspecified: Secondary | ICD-10-CM | POA: Diagnosis not present

## 2023-03-26 DIAGNOSIS — Z720 Tobacco use: Secondary | ICD-10-CM | POA: Diagnosis not present

## 2023-03-26 DIAGNOSIS — E039 Hypothyroidism, unspecified: Secondary | ICD-10-CM | POA: Diagnosis not present

## 2023-03-26 DIAGNOSIS — Z5320 Procedure and treatment not carried out because of patient's decision for unspecified reasons: Secondary | ICD-10-CM

## 2023-03-26 DIAGNOSIS — E7800 Pure hypercholesterolemia, unspecified: Secondary | ICD-10-CM | POA: Insufficient documentation

## 2023-03-26 DIAGNOSIS — Z1159 Encounter for screening for other viral diseases: Secondary | ICD-10-CM | POA: Diagnosis not present

## 2023-03-26 DIAGNOSIS — F324 Major depressive disorder, single episode, in partial remission: Secondary | ICD-10-CM

## 2023-03-26 DIAGNOSIS — M810 Age-related osteoporosis without current pathological fracture: Secondary | ICD-10-CM | POA: Diagnosis not present

## 2023-03-26 DIAGNOSIS — J449 Chronic obstructive pulmonary disease, unspecified: Secondary | ICD-10-CM

## 2023-03-26 DIAGNOSIS — F411 Generalized anxiety disorder: Secondary | ICD-10-CM

## 2023-03-26 DIAGNOSIS — I10 Essential (primary) hypertension: Secondary | ICD-10-CM

## 2023-03-26 MED ORDER — ROSUVASTATIN CALCIUM 10 MG PO TABS: 10.0000 mg | ORAL_TABLET | Freq: Every day | ORAL | 1 refills | Status: DC

## 2023-03-26 MED ORDER — TRELEGY ELLIPTA 100-62.5-25 MCG/ACT IN AEPB: 1.0000 | INHALATION_SPRAY | Freq: Every day | RESPIRATORY_TRACT | 1 refills | Status: DC

## 2023-03-26 MED ORDER — VITAMIN D (ERGOCALCIFEROL) 1.25 MG (50000 UNIT) PO CAPS: 50000.0000 [IU] | ORAL_CAPSULE | ORAL | 3 refills | Status: DC

## 2023-03-26 MED ORDER — ESCITALOPRAM OXALATE 20 MG PO TABS: 20.0000 mg | ORAL_TABLET | Freq: Every day | ORAL | 1 refills | Status: DC

## 2023-03-26 MED ORDER — LEVOTHYROXINE SODIUM 25 MCG PO TABS: 25.0000 ug | ORAL_TABLET | Freq: Every day | ORAL | 1 refills | Status: DC

## 2023-03-26 MED ORDER — PANTOPRAZOLE SODIUM 20 MG PO TBEC: 20.0000 mg | DELAYED_RELEASE_TABLET | Freq: Every day | ORAL | 1 refills | Status: DC

## 2023-03-26 MED ORDER — LISINOPRIL 10 MG PO TABS: 10.0000 mg | ORAL_TABLET | Freq: Every day | ORAL | 1 refills | Status: DC

## 2023-03-26 NOTE — Progress Notes (Signed)
Subjective: CC: Chronic follow-up PCP: Raliegh Ip, DO XLK:[id number] S Amanda York is a 57 y.o. female presenting to clinic today for:  1.  Hypertension associate with hyperlipidemia Patient is compliant with lisinopril but notes that she discontinued rosuvastatin because she did not like the way it made her feel.  She denies any chest pain, shortness of breath, dizziness.  She is gained a few pounds because her daughter has been making a lot of baked goods.  2.  COPD Compliant with Trelegy.  Ongoing tobacco use.  No hemoptysis, unplanned weight loss, night sweats or chest pain reported.  3.  Anxiety and depression Compliant with Lexapro.  Reports stability.  Needs refills  4.  Hypothyroidism Compliant with Synthroid.  No tremor, heart palpitations or changes in bowel habits   ROS: Per HPI  Allergies  Allergen Reactions   Alendronate     Reported "internal" allergy. ?cause but this was the only thing started around the time she was evaluated in ER.   Past Medical History:  Diagnosis Date   Arthritis    Atypical chest pain 10/20/2014   Chest pain 10/19/2014   COPD (chronic obstructive pulmonary disease) (HCC)    Hypertension    Hypothyroidism    Tobacco abuse     Current Outpatient Medications:    albuterol (VENTOLIN HFA) 108 (90 Base) MCG/ACT inhaler, INHALE 2 PUFFS INTO THE LUNGS EVERY 6 HOURS AS NEEDED FOR WHEEZING OR SHORTNESS OF BREATH, Disp: 6.7 g, Rfl: 1   aspirin EC 81 MG EC tablet, Take 1 tablet (81 mg total) by mouth daily., Disp: 30 tablet, Rfl: 0   cetirizine (ZYRTEC) 10 MG tablet, Take 1 tablet (10 mg total) by mouth daily., Disp: 30 tablet, Rfl: 2   escitalopram (LEXAPRO) 20 MG tablet, Take 1 tablet (20 mg total) by mouth daily., Disp: 90 tablet, Rfl: 1   Fluticasone-Umeclidin-Vilant (TRELEGY ELLIPTA) 100-62.5-25 MCG/ACT AEPB, Inhale 1 puff into the lungs daily., Disp: 180 each, Rfl: 1   levothyroxine (SYNTHROID) 25 MCG tablet, Take 1 tablet (25 mcg total)  by mouth daily before breakfast., Disp: 90 tablet, Rfl: 1   lisinopril (ZESTRIL) 10 MG tablet, Take 1 tablet (10 mg total) by mouth daily., Disp: 90 tablet, Rfl: 1   Vitamin D, Ergocalciferol, (DRISDOL) 1.25 MG (50000 UNIT) CAPS capsule, Take 1 capsule (50,000 Units total) by mouth every 7 (seven) days., Disp: 12 capsule, Rfl: 3 Social History   Socioeconomic History   Marital status: Married    Spouse name: Not on file   Number of children: Not on file   Years of education: Not on file   Highest education level: Not on file  Occupational History   Not on file  Tobacco Use   Smoking status: Every Day    Current packs/day: 1.00    Average packs/day: 1 pack/day for 41.0 years (41.0 ttl pk-yrs)    Types: Cigarettes   Smokeless tobacco: Never   Tobacco comments:    Since age 77  Vaping Use   Vaping status: Never Used  Substance and Sexual Activity   Alcohol use: No    Alcohol/week: 0.0 standard drinks of alcohol   Drug use: No   Sexual activity: Yes  Other Topics Concern   Not on file  Social History Narrative   Not on file   Social Drivers of Health   Financial Resource Strain: Low Risk  (03/26/2023)   Overall Financial Resource Strain (CARDIA)    Difficulty of Paying Living Expenses: Not hard  at all  Food Insecurity: No Food Insecurity (03/26/2023)   Hunger Vital Sign    Worried About Running Out of Food in the Last Year: Never true    Ran Out of Food in the Last Year: Never true  Transportation Needs: No Transportation Needs (03/26/2023)   PRAPARE - Administrator, Civil Service (Medical): No    Lack of Transportation (Non-Medical): No  Physical Activity: Inactive (03/26/2023)   Exercise Vital Sign    Days of Exercise per Week: 0 days    Minutes of Exercise per Session: 0 min  Stress: No Stress Concern Present (03/26/2023)   [employer] of Occupational Health - Occupational Stress Questionnaire    Feeling of Stress : Not at all  Social Connections:  Socially Isolated (03/26/2023)   Social Connection and Isolation Panel [NHANES]    Frequency of Communication with Friends and Family: Never    Frequency of Social Gatherings with Friends and Family: Never    Attends Religious Services: Never    Database administrator or Organizations: No    Attends Banker Meetings: Never    Marital Status: Married  Catering manager Violence: Not At Risk (03/26/2023)   Humiliation, Afraid, Rape, and Kick questionnaire    Fear of Current or Ex-Partner: No    Emotionally Abused: No    Physically Abused: No    Sexually Abused: No   Family History  Problem Relation Age of Onset   CAD Father        Died at 66 of MI   Coronary artery disease Father    COPD Sister    CAD Paternal Uncle        Died at 26 of MI   Breast cancer Maternal Grandmother    CAD Paternal Grandfather        Details unclaer    Objective: Office vital signs reviewed. BP 124/83   Pulse 88   Temp 98.4 F (36.9 C)   Ht 5\' 1"  (1.549 m)   Wt 138 lb (62.6 kg)   SpO2 95%   BMI 26.07 kg/m   Physical Examination:  General: Awake, alert, well nourished, No acute distress HEENT: sclera white, MMM Cardio: regular rate and rhythm, S1S2 heard, no murmurs appreciated Pulm: Rhonchorous breath sounds appreciated throughout.  Normal work of breathing on room air     03/26/2023    1:35 PM 01/09/2022    9:24 AM 03/17/2021    8:58 AM  Depression screen PHQ 2/9  Decreased Interest 0 1 0  Down, Depressed, Hopeless 0 0 0  PHQ - 2 Score 0 1 0  Altered sleeping 0 0 0  Tired, decreased energy 0 1 0  Change in appetite 0 0 0  Feeling bad or failure about yourself  0 0 0  Trouble concentrating 0 1 0  Moving slowly or fidgety/restless 0 0 0  Suicidal thoughts 0 0 0  PHQ-9 Score 0 3 0  Difficult doing work/chores Not difficult at all Not difficult at all Not difficult at all      03/26/2023    1:34 PM 01/09/2022    9:25 AM 03/17/2021    8:59 AM 09/14/2020   11:17 AM   GAD 7 : Generalized Anxiety Score  Nervous, Anxious, on Edge 0 1 0 0  Control/stop worrying 0 0 0 0  Worry too much - different things 0 0 0 0  Trouble relaxing 0 0 0 0  Restless 0 0 0 0  Easily annoyed or irritable 0 0 0 0  Afraid - awful might happen 0 0 0 0  Total GAD 7 Score 0 1 0 0  Anxiety Difficulty  Not difficult at all Not difficult at all Not difficult at all   Assessment/ Plan: 57 y.o. female   Generalized anxiety disorder - Plan: escitalopram (LEXAPRO) 20 MG tablet  Major depressive disorder with single episode, in partial remission (HCC) - Plan: escitalopram (LEXAPRO) 20 MG tablet  Chronic obstructive pulmonary disease, unspecified COPD type (HCC) - Plan: Fluticasone-Umeclidin-Vilant (TRELEGY ELLIPTA) 100-62.5-25 MCG/ACT AEPB  Tobacco use - Plan: Fluticasone-Umeclidin-Vilant (TRELEGY ELLIPTA) 100-62.5-25 MCG/ACT AEPB, CBC  Acquired hypothyroidism - Plan: levothyroxine (SYNTHROID) 25 MCG tablet, TSH + free T4  Essential hypertension - Plan: lisinopril (ZESTRIL) 10 MG tablet  Pure hypercholesterolemia - Plan: Lipid Panel, DISCONTINUED: rosuvastatin (CRESTOR) 10 MG tablet  Refusal of statin medication by patient  Age-related osteoporosis without current pathological fracture - Plan: Vitamin D, Ergocalciferol, (DRISDOL) 1.25 MG (50000 UNIT) CAPS capsule, CMP14+EGFR, VITAMIN D 25 Hydroxy (Vit-D Deficiency, Fractures)  Vitamin D deficiency - Plan: Vitamin D, Ergocalciferol, (DRISDOL) 1.25 MG (50000 UNIT) CAPS capsule, VITAMIN D 25 Hydroxy (Vit-D Deficiency, Fractures)  Need for hepatitis C screening test - Plan: Hepatitis C Antibody  I renewed all of her medications.  Anxiety and depression are well-controlled.    COPD is stable with Trelegy.  She was given samples today and renewal of Rx sent.  Tobacco use.  She declines lung cancer screening.  Rhonchorous breath sounds on exam today  Asymptomatic from a thyroid standpoint.  Check thyroid levels.  Synthroid  renewed  Blood pressure controlled.  Patient has declined ongoing statin use.  Understands risk of CVA, MI.  Lipid panel collected today  Vitamin D renewed.  Not currently treated for osteoporosis besides vitamin D and calcium.  Will revisit this discussion at her next visit  Screening hepatitis C   Blondell Laperle Hulen Skains, DO [facility] [phone]

## 2023-03-27 LAB — LIPID PANEL
Chol/HDL Ratio: 3.9 {ratio} (ref 0.0–4.4)
Cholesterol, Total: 264 mg/dL — ABNORMAL HIGH (ref 100–199)
HDL: 68 mg/dL (ref >39)
LDL Chol Calc (NIH): 164 mg/dL — ABNORMAL HIGH (ref 0–99)
Triglycerides: 177 mg/dL — ABNORMAL HIGH (ref 0–149)
VLDL Cholesterol Cal: 32 mg/dL (ref 5–40)

## 2023-03-27 LAB — CMP14+EGFR
ALT: 11 [IU]/L (ref 0–32)
AST: 14 [IU]/L (ref 0–40)
Albumin: 4.3 g/dL (ref 3.8–4.9)
Alkaline Phosphatase: 68 [IU]/L (ref 44–121)
BUN/Creatinine Ratio: 14 (ref 9–23)
BUN: 9 mg/dL (ref 6–24)
Bilirubin Total: 0.3 mg/dL (ref 0.0–1.2)
CO2: 24 mmol/L (ref 20–29)
Calcium: 9.6 mg/dL (ref 8.7–10.2)
Chloride: 95 mmol/L — ABNORMAL LOW (ref 96–106)
Creatinine, Ser: 0.64 mg/dL (ref 0.57–1.00)
Globulin, Total: 2.1 g/dL (ref 1.5–4.5)
Glucose: 93 mg/dL (ref 70–99)
Potassium: 4.6 mmol/L (ref 3.5–5.2)
Sodium: 134 mmol/L (ref 134–144)
Total Protein: 6.4 g/dL (ref 6.0–8.5)
eGFR: 102 mL/min/{1.73_m2} (ref >59)

## 2023-03-27 LAB — CBC
Hematocrit: 49.5 % — ABNORMAL HIGH (ref 34.0–46.6)
Hemoglobin: 16.5 g/dL — ABNORMAL HIGH (ref 11.1–15.9)
MCH: 29.2 pg (ref 26.6–33.0)
MCHC: 33.3 g/dL (ref 31.5–35.7)
MCV: 88 fL (ref 79–97)
Platelets: 232 10*3/uL (ref 150–450)
RBC: 5.66 x10E6/uL — ABNORMAL HIGH (ref 3.77–5.28)
RDW: 13.1 % (ref 11.7–15.4)
WBC: 6 10*3/uL (ref 3.4–10.8)

## 2023-03-27 LAB — VITAMIN D 25 HYDROXY (VIT D DEFICIENCY, FRACTURES): Vit D, 25-Hydroxy: 54.9 ng/mL (ref 30.0–100.0)

## 2023-03-27 LAB — HEPATITIS C ANTIBODY: Hep C Virus Ab: NONREACTIVE

## 2023-03-27 LAB — TSH+FREE T4
Free T4: 1.06 ng/dL (ref 0.82–1.77)
TSH: 1.97 u[IU]/mL (ref 0.450–4.500)

## 2023-04-02 ENCOUNTER — Telehealth: Payer: Self-pay

## 2023-04-02 NOTE — Telephone Encounter (Signed)
Copied from CRM [id number]. Topic: Clinical - Lab/Test Results >> Apr 02, 2023 10:58 AM Clayton Bibles wrote: Reason for CRM: She is returning phone call about lab results. Please call her at [phone]

## 2023-04-02 NOTE — Telephone Encounter (Signed)
Refer to lab result  

## 2023-04-23 ENCOUNTER — Other Ambulatory Visit (HOSPITAL_COMMUNITY): Payer: Self-pay

## 2023-04-23 ENCOUNTER — Telehealth: Payer: Self-pay | Admitting: Pharmacy Technician

## 2023-04-23 NOTE — Telephone Encounter (Signed)
 ERROR

## 2023-06-18 ENCOUNTER — Other Ambulatory Visit: Payer: Self-pay | Admitting: Family Medicine

## 2023-06-18 DIAGNOSIS — Z720 Tobacco use: Secondary | ICD-10-CM

## 2023-06-18 DIAGNOSIS — J449 Chronic obstructive pulmonary disease, unspecified: Secondary | ICD-10-CM

## 2023-06-21 ENCOUNTER — Other Ambulatory Visit (HOSPITAL_COMMUNITY): Payer: Self-pay

## 2023-06-21 ENCOUNTER — Telehealth: Payer: Self-pay | Admitting: Pharmacy Technician

## 2023-06-21 NOTE — Telephone Encounter (Signed)
 Pharmacy Patient Advocate Encounter   Received notification from CoverMyMeds that prior authorization for Trelegy Ellipta 100-62.5-25MCG/ACT is required/requested.   Insurance verification completed.   The patient is insured through  [employer]  .   Per test claim:  Amanda York is preferred by the insurance.  If suggested medication is appropriate, Please send in a new RX and discontinue this one. If not, please advise as to why it's not appropriate so that we may request a Prior Authorization. Please note, some preferred medications may still require a PA.  If the suggested medications have not been trialed and there are no contraindications to their use, the PA will not be submitted, as it will not be approved.

## 2023-06-22 NOTE — Telephone Encounter (Signed)
 Left vm for cb

## 2023-06-22 NOTE — Telephone Encounter (Signed)
 Please inform the patient that Trelegy is not covered by her insurance without having at least trialed Breztri.  I am glad to send this prescription over if she is amenable to the change.  If she does agree to the change please inform her that will be 2 puffs twice daily of the new inhaler and she is to rinse her mouth out after each use

## 2023-07-27 ENCOUNTER — Other Ambulatory Visit: Payer: Self-pay

## 2023-07-27 DIAGNOSIS — Z1211 Encounter for screening for malignant neoplasm of colon: Secondary | ICD-10-CM

## 2023-12-30 ENCOUNTER — Ambulatory Visit
Admission: EM | Admit: 2023-12-30 | Discharge: 2023-12-30 | Disposition: A | Discharge status: Home / Self Care | Attending: Internal Medicine | Admitting: Internal Medicine

## 2023-12-30 DIAGNOSIS — L02412 Cutaneous abscess of left axilla: Secondary | ICD-10-CM

## 2023-12-30 MED ORDER — DOXYCYCLINE HYCLATE 100 MG PO CAPS: 100.0000 mg | ORAL_CAPSULE | Freq: Two times a day (BID) | ORAL | 0 refills | Status: DC

## 2023-12-30 NOTE — ED Triage Notes (Signed)
 Pt reports she has lump under her left armpit axilla that is painful x 4 days

## 2023-12-30 NOTE — Discharge Instructions (Signed)

## 2023-12-30 NOTE — ED Provider Notes (Signed)
 [facility]    CSN: [id number] Arrival date & time: 12/30/23  1304      History   Chief Complaint No chief complaint on file.   HPI Amanda York is a 58 y.o. female.   Amanda York is a 58 y.o. female presenting for chief complaint of swollen, red, and painful knot underneath the left armpit that started 4 days ago on Wednesday, December 26, 2023.  The area of pain and swelling has grown in size over the last 4 days.  Denies history of abscess/cyst to this area.  Denies recent fever, chills, nausea, vomiting, and bodyaches.  No recent injuries or trauma to the left axilla.  She has not shaved her hair in the armpit region lately.  She is using warm compresses and Tylenol  for pain with some relief.  Denies recent antibiotic or steroid use in last 90 days.      Past Medical History:  Diagnosis Date   Arthritis    Atypical chest pain 10/20/2014   Chest pain 10/19/2014   COPD (chronic obstructive pulmonary disease) (HCC)    Hypertension    Hypothyroidism    Tobacco abuse     Patient Active Problem List   Diagnosis Date Noted   Pure hypercholesterolemia 03/26/2023   Age-related osteoporosis without current pathological fracture 03/26/2023   Acquired hypothyroidism 03/26/2023   Positive self-administered antigen test for COVID-19 12/26/2022   Vitamin D  deficiency 10/07/2020   Generalized anxiety disorder 06/04/2018   Depression 04/28/2015   COPD (chronic obstructive pulmonary disease) (HCC) 10/20/2014   Tobacco abuse 10/20/2014   Essential hypertension 10/19/2014    Past Surgical History:  Procedure Laterality Date   none      OB History   No obstetric history on file.      Home Medications    Prior to Admission medications   Medication Sig Start Date End Date Taking? Authorizing Provider  doxycycline  (VIBRAMYCIN ) 100 MG capsule Take 1 capsule (100 mg total) by mouth 2 (two) times daily. 12/30/23  Yes Enedelia Dorna HERO, FNP  albuterol   (VENTOLIN  HFA) 108 (90 Base) MCG/ACT inhaler INHALE 2 PUFFS INTO THE LUNGS EVERY 6 HOURS AS NEEDED FOR WHEEZING OR SHORTNESS OF BREATH 01/19/23   Jolinda Potter M, DO  aspirin  EC 81 MG EC tablet Take 1 tablet (81 mg total) by mouth daily. 10/20/14   Dhungel, Barnetta, MD  cetirizine  (ZYRTEC ) 10 MG tablet Take 1 tablet (10 mg total) by mouth daily. 09/14/20   Jolinda Potter HERO, DO  escitalopram  (LEXAPRO ) 20 MG tablet Take 1 tablet (20 mg total) by mouth daily. 03/26/23   Jolinda Potter HERO, DO  levothyroxine  (SYNTHROID ) 25 MCG tablet Take 1 tablet (25 mcg total) by mouth daily before breakfast. 03/26/23   Jolinda Potter M, DO  lisinopril  (ZESTRIL ) 10 MG tablet Take 1 tablet (10 mg total) by mouth daily. 03/26/23   Jolinda Potter HERO, DO  TRELEGY ELLIPTA  100-62.5-25 MCG/ACT AEPB INHALE 1 PUFF INTO THE LUNGS DAILY 06/18/23   Jolinda Potter M, DO  Vitamin D , Ergocalciferol , (DRISDOL ) 1.25 MG (50000 UNIT) CAPS capsule Take 1 capsule (50,000 Units total) by mouth every 7 (seven) days. 03/26/23   Jolinda Potter HERO, DO    Family History Family History  Problem Relation Age of Onset   CAD Father        Died at 1 of MI   Coronary artery disease Father    COPD Sister    CAD Paternal Uncle  Died at 36 of MI   Breast cancer Maternal Grandmother    CAD Paternal Grandfather        Details unclaer    Social History Social History   Tobacco Use   Smoking status: Every Day    Current packs/day: 1.00    Average packs/day: 1 pack/day for 41.0 years (41.0 ttl pk-yrs)    Types: Cigarettes   Smokeless tobacco: Never   Tobacco comments:    Since age 64  Vaping Use   Vaping status: Never Used  Substance Use Topics   Alcohol use: No    Alcohol/week: 0.0 standard drinks of alcohol   Drug use: No     Allergies   Alendronate    Review of Systems Review of Systems Per HPI  Physical Exam Triage Vital Signs ED Triage Vitals  Encounter Vitals Group     BP 12/30/23 1404 118/87      Girls Systolic BP Percentile --      Girls Diastolic BP Percentile --      Boys Systolic BP Percentile --      Boys Diastolic BP Percentile --      Pulse Rate 12/30/23 1404 (!) 112     Resp 12/30/23 1404 20     Temp 12/30/23 1404 98.2 F (36.8 C)     Temp Source 12/30/23 1404 Oral     SpO2 12/30/23 1404 92 %     Weight --      Height --      Head Circumference --      Peak Flow --      Pain Score 12/30/23 1403 10     Pain Loc --      Pain Education --      Exclude from Growth Chart --    No data found.  Updated Vital Signs BP 118/87 (BP Location: Right Arm)   Pulse (!) 112   Temp 98.2 F (36.8 C) (Oral)   Resp 20   SpO2 92%   Visual Acuity Right Eye Distance:   Left Eye Distance:   Bilateral Distance:    Right Eye Near:   Left Eye Near:    Bilateral Near:     Physical Exam Vitals and nursing note reviewed.  Constitutional:      Appearance: She is not ill-appearing or toxic-appearing.  HENT:     Head: Normocephalic and atraumatic.     Right Ear: Hearing and external ear normal.     Left Ear: Hearing and external ear normal.     Nose: Nose normal.     Mouth/Throat:     Lips: Pink.  Eyes:     General: Lids are normal. Vision grossly intact. Gaze aligned appropriately.     Extraocular Movements: Extraocular movements intact.     Conjunctiva/sclera: Conjunctivae normal.  Pulmonary:     Effort: Pulmonary effort is normal.  Musculoskeletal:     Cervical back: Neck supple.  Lymphadenopathy:     Upper Body:     Left upper body: Axillary adenopathy present.  Skin:    General: Skin is warm and dry.     Capillary Refill: Capillary refill takes less than 2 seconds.     Findings: Abscess (Abscess to the left axilla region as seen in image below.  Soft tissue swelling, erythema, and induration surrounding abscess.  Very tender to palpation.  Nondraining.  Nonfluctuant.) present. No rash.  Neurological:     General: No focal deficit present.     Mental Status:  She is alert and oriented to person, place, and time. Mental status is at baseline.     Cranial Nerves: No dysarthria or facial asymmetry.  Psychiatric:        Mood and Affect: Mood normal.        Speech: Speech normal.        Behavior: Behavior normal.        Thought Content: Thought content normal.        Judgment: Judgment normal.    Left axilla   UC Treatments / Results  Labs (all labs ordered are listed, but only abnormal results are displayed) Labs Reviewed - No data to display  EKG   Radiology No results found.  Procedures Procedures (including critical care time)  Medications Ordered in UC Medications - No data to display  Initial Impression / Assessment and Plan / UC Course  I have reviewed the triage vital signs and the nursing notes.  Pertinent labs & imaging results that were available during my care of the patient were reviewed by me and considered in my medical decision making (see chart for details).   1.  Abscess of left axilla Abscess appears infected, though is immature and is not ready for drainage.  Doxycycline  antibiotic ordered, discussed supportive care and wound care as outlined in AVS. Return for I&D if abscess fails to drain on its own in the next 2-3 days with use of antibiotic and supportive care.   Counseled patient on potential for adverse effects with medications prescribed/recommended today, strict ER and return-to-clinic precautions discussed, patient verbalized understanding.    Final Clinical Impressions(s) / UC Diagnoses   Final diagnoses:  Abscess of left axilla     Discharge Instructions      Your abscess has been evaluated in the clinic and appears to be infected, but is not quite ready for drainage.   I would like for you to perform warm compresses to the area frequently and continue taking over the counter medications for pain and inflammation as directed.   Start taking antibiotic sent to pharmacy as directed. This  will help reduce infection to area and help the abscess mature/drain.   If abscess does not begin to drain on its own over the next few days and becomes softer, please return to urgent care for this to be drained appropriately.   If you have any fever, chills, nausea, vomiting, or worsening pain/swelling to the area, please return to urgent care for reevaluation.     ED Prescriptions     Medication Sig Dispense Auth. Provider   doxycycline  (VIBRAMYCIN ) 100 MG capsule Take 1 capsule (100 mg total) by mouth 2 (two) times daily. 20 capsule Enedelia Dorna HERO, FNP      PDMP not reviewed this encounter.   Enedelia Dorna HERO, [place] 12/30/23 1501

## 2024-01-03 ENCOUNTER — Ambulatory Visit: Admitting: Family Medicine

## 2024-01-03 ENCOUNTER — Encounter: Payer: Self-pay | Admitting: Family Medicine

## 2024-01-03 VITALS — BP 133/83 | HR 84 | Temp 98.2°F | Ht 61.0 in | Wt 139.8 lb

## 2024-01-03 DIAGNOSIS — L02412 Cutaneous abscess of left axilla: Secondary | ICD-10-CM | POA: Diagnosis not present

## 2024-01-03 DIAGNOSIS — L732 Hidradenitis suppurativa: Secondary | ICD-10-CM | POA: Diagnosis not present

## 2024-01-03 MED ORDER — LINEZOLID 600 MG PO TABS: 600.0000 mg | ORAL_TABLET | Freq: Two times a day (BID) | ORAL | 0 refills | Status: DC

## 2024-01-03 NOTE — Progress Notes (Signed)
 Subjective:  Patient ID: Amanda York, female    DOB: [birth year]  Age: 58 y.o. MRN: [id number]  CC: knot under left armpit (Urgent care says it is a mass)   HPI  Discussed the use of AI scribe software for clinical note transcription with the patient, who gave verbal consent to proceed.  History of Present Illness Amanda York is a 58 year old female who presents with painful masses under her left arm.  She has been experiencing multiple painful masses under her left arm, describing the pain as severe with significant pressure in the area. These symptoms prompted her to seek urgent care four days ago, where she was diagnosed with an infection and prescribed doxycycline . Despite taking the antibiotic for four days, there has been no improvement in her condition.  She has never experienced similar symptoms before. She is not very social and primarily interacts with her children and grandchildren, who are all reportedly healthy. She denies exposure to healthcare environments, nursing homes, or individuals with chronic infections.          01/03/2024    9:17 AM 03/26/2023    1:35 PM 01/09/2022    9:24 AM  Depression screen PHQ 2/9  Decreased Interest 0 0 1  Down, Depressed, Hopeless 0 0 0  PHQ - 2 Score 0 0 1  Altered sleeping  0 0  Tired, decreased energy  0 1  Change in appetite  0 0  Feeling bad or failure about yourself   0 0  Trouble concentrating  0 1  Moving slowly or fidgety/restless  0 0  Suicidal thoughts  0 0  PHQ-9 Score  0 3  Difficult doing work/chores  Not difficult at all Not difficult at all    History Amanda York has a past medical history of Arthritis, Atypical chest pain (10/20/2014), Chest pain (10/19/2014), COPD (chronic obstructive pulmonary disease) (HCC), Hypertension, Hypothyroidism, and Tobacco abuse.   She has a past surgical history that includes none.   Her family history includes Breast cancer in her maternal grandmother; CAD in her father,  paternal grandfather, and paternal uncle; COPD in her sister; Coronary artery disease in her father.She reports that she has been smoking cigarettes. She has a 41 pack-year smoking history. She has never used smokeless tobacco. She reports that she does not drink alcohol and does not use drugs.    ROS Review of Systems  Objective:  BP 133/83   Pulse 84   Temp 98.2 F (36.8 C)   Ht 5' 1 (1.549 m)   Wt 139 lb 12.8 oz (63.4 kg)   SpO2 93%   BMI 26.41 kg/m   BP Readings from Last 3 Encounters:  01/03/24 133/83  12/30/23 118/87  03/26/23 124/83    Wt Readings from Last 3 Encounters:  01/03/24 139 lb 12.8 oz (63.4 kg)  03/26/23 138 lb (62.6 kg)  12/26/22 140 lb (63.5 kg)     Physical Exam Physical Exam GENERAL: Alert, cooperative, well developed, no acute distress. HEENT: Normocephalic, normal oropharynx, moist mucous membranes. CHEST: Clear to auscultation bilaterally. No wheezes, rhonchi, or crackles. CARDIOVASCULAR: Normal heart rate and rhythm. S1 and S2 normal without murmurs. ABDOMEN: Soft, non-tender, non-distended, without organomegaly. Normal bowel sounds. EXTREMITIES: No cyanosis or edema. NEUROLOGICAL: Cranial nerves grossly intact. Moves all extremities without gross motor or sensory deficit. SKIN: Multiple painful masses under left arm, likely hidradenitis. Superficial abscess under left arm.   Assessment & Plan:  Hydradenitis -  WOUND CULTURE  Abscess of left axilla -     WOUND CULTURE  Other orders -     Linezolid; Take 1 tablet (600 mg total) by mouth 2 (two) times daily for 10 days.  Dispense: 20 tablet; Refill: 0    Assessment and Plan Assessment & Plan Hidradenitis suppurativa with cutaneous abscesses of left axilla   Multiple painful abscesses in the left axilla, consistent with hidradenitis suppurativa, are causing significant discomfort and pressure. Previous treatment with doxycycline  for four days was ineffective. The abscesses are  superficial and require drainage. Prepare for incision and drainage in the procedure room. Administer lidocaine 2% with epinephrine for local anesthesia. Use an eleven blade scalpel for incision. Clean the area with Betadine and set up suture materials for the procedure.  Area prepped as above. Three fluctuant nodules approx. 4 mm, 6 mm & 1.2 cm. Each was lanced with 11 blade scalpel and copious mucopurulent matter expressed. After thorough drainage and breaking up of loculations the lesion was dressed with neosporin and gauze. Wound care reviewed with patient.      Follow-up: Return if symptoms worsen or fail to improve.  Butler Der, M.D.

## 2024-01-06 ENCOUNTER — Ambulatory Visit: Payer: Self-pay | Admitting: Family Medicine

## 2024-01-08 ENCOUNTER — Encounter: Payer: Self-pay | Admitting: Family Medicine

## 2024-01-08 ENCOUNTER — Ambulatory Visit: Admitting: Family Medicine

## 2024-01-08 VITALS — BP 126/79 | HR 82 | Temp 98.5°F | Ht 61.0 in | Wt 140.0 lb

## 2024-01-08 DIAGNOSIS — L732 Hidradenitis suppurativa: Secondary | ICD-10-CM

## 2024-01-08 DIAGNOSIS — L02412 Cutaneous abscess of left axilla: Secondary | ICD-10-CM

## 2024-01-08 LAB — WOUND CULTURE

## 2024-01-08 MED ORDER — LINEZOLID 600 MG PO TABS: 600.0000 mg | ORAL_TABLET | Freq: Two times a day (BID) | ORAL | 0 refills | Status: AC

## 2024-01-08 NOTE — Progress Notes (Signed)
 Chief Complaint  Patient presents with   Mass    Left Axilla Follow up    HPI  Patient presents today for Discussed the use of AI scribe software for clinical note transcription with the patient, who gave verbal consent to proceed.  History of Present Illness Amanda York is a 58 year old female who presents with a MRSA infection in her arm.  She has been experiencing discomfort in her arm due to a MRSA infection, confirmed by a positive culture. She is currently on linezolid, an antibiotic, and has been taking it as prescribed. She has four more days of the current course of antibiotics remaining.  She notes significant improvement in the condition of her arm, describing it as 'ten times better' than before. However, she still feels a 'big lump' under the skin, which she describes as swollen rather than mushy.     PMH: Smoking status noted Review of Systems  Objective: BP 126/79   Pulse 82   Temp 98.5 F (36.9 C)   Ht 5' 1 (1.549 m)   Wt 140 lb (63.5 kg)   SpO2 92%   BMI 26.45 kg/m  Gen: NAD, alert, cooperative with exam HEENT: NCAT, EOMI, PERRL CV: RRR, good S1/S2, no murmur Resp: CTABL, no wheezes, non-labored Ext: No edema, warm. Left axilla has minimal induration of 4X8 mm at site with no remaining fluctuance or erythema. Neuro: Alert and oriented, No gross deficits  Abscess of left axilla  Hydradenitis  Other orders -     Linezolid; Take 1 tablet (600 mg total) by mouth 2 (two) times daily for 10 days.  Dispense: 20 tablet; Refill: 0    Assessment and Plan Assessment & Plan Hidradenitis suppurativa with cutaneous abscess of left axilla and MRSA skin/soft tissue infection   The abscess in the left axilla is improving, though residual swelling remains. The infection is confirmed as MRSA, with more swelling than pus. Extend linezolid treatment for an additional week. Schedule a follow-up in two weeks to reassess. Advise returning sooner if symptoms worsen.  Cancel the appointment a day in advance if significantly better and follow-up is unnecessary.      Butler Der, MD

## 2024-02-04 ENCOUNTER — Ambulatory Visit: Payer: Self-pay | Admitting: Family Medicine

## 2024-02-27 ENCOUNTER — Encounter: Payer: Self-pay | Admitting: Family Medicine

## 2024-02-27 ENCOUNTER — Encounter: Payer: Self-pay | Admitting: Emergency Medicine

## 2024-02-27 ENCOUNTER — Ambulatory Visit: Payer: Self-pay | Admitting: Family Medicine

## 2024-02-27 VITALS — BP 128/83 | HR 96 | Temp 97.8°F | Ht 61.0 in | Wt 134.5 lb

## 2024-02-27 DIAGNOSIS — M810 Age-related osteoporosis without current pathological fracture: Secondary | ICD-10-CM

## 2024-02-27 DIAGNOSIS — J432 Centrilobular emphysema: Secondary | ICD-10-CM

## 2024-02-27 DIAGNOSIS — I10 Essential (primary) hypertension: Secondary | ICD-10-CM

## 2024-02-27 DIAGNOSIS — E039 Hypothyroidism, unspecified: Secondary | ICD-10-CM

## 2024-02-27 DIAGNOSIS — Z720 Tobacco use: Secondary | ICD-10-CM

## 2024-02-27 DIAGNOSIS — Z0000 Encounter for general adult medical examination without abnormal findings: Secondary | ICD-10-CM

## 2024-02-27 DIAGNOSIS — F324 Major depressive disorder, single episode, in partial remission: Secondary | ICD-10-CM

## 2024-02-27 DIAGNOSIS — F411 Generalized anxiety disorder: Secondary | ICD-10-CM

## 2024-02-27 DIAGNOSIS — J441 Chronic obstructive pulmonary disease with (acute) exacerbation: Secondary | ICD-10-CM

## 2024-02-27 DIAGNOSIS — Z0001 Encounter for general adult medical examination with abnormal findings: Secondary | ICD-10-CM | POA: Diagnosis not present

## 2024-02-27 DIAGNOSIS — Z789 Other specified health status: Secondary | ICD-10-CM

## 2024-02-27 DIAGNOSIS — E7800 Pure hypercholesterolemia, unspecified: Secondary | ICD-10-CM

## 2024-02-27 MED ORDER — METHYLPREDNISOLONE ACETATE 80 MG/ML IJ SUSP
40.0000 mg | Freq: Once | INTRAMUSCULAR | Status: AC
  Administered 2024-02-27: 40 mg via INTRAMUSCULAR

## 2024-02-27 MED ORDER — TRELEGY ELLIPTA 100-62.5-25 MCG/ACT IN AEPB
1.0000 | INHALATION_SPRAY | Freq: Every day | RESPIRATORY_TRACT | 4 refills | Status: DC
Start: 2024-02-27 — End: 2024-02-27

## 2024-02-27 MED ORDER — ESCITALOPRAM OXALATE 20 MG PO TABS: 20.0000 mg | ORAL_TABLET | Freq: Every day | ORAL | 4 refills | Status: DC

## 2024-02-27 MED ORDER — LISINOPRIL 10 MG PO TABS: 10.0000 mg | ORAL_TABLET | Freq: Every day | ORAL | 4 refills | Status: DC

## 2024-02-27 MED ORDER — DOXYCYCLINE HYCLATE 100 MG PO TABS: 100.0000 mg | ORAL_TABLET | Freq: Two times a day (BID) | ORAL | 0 refills | Status: AC

## 2024-02-27 MED ORDER — TRELEGY ELLIPTA 200-62.5-25 MCG/ACT IN AEPB: 1.0000 | INHALATION_SPRAY | Freq: Every day | RESPIRATORY_TRACT | 3 refills | Status: DC

## 2024-02-27 MED ORDER — LEVOTHYROXINE SODIUM 25 MCG PO TABS: 25.0000 ug | ORAL_TABLET | Freq: Every day | ORAL | 4 refills | Status: DC

## 2024-02-27 MED ORDER — ALBUTEROL SULFATE HFA 108 (90 BASE) MCG/ACT IN AERS: 2.0000 | INHALATION_SPRAY | Freq: Four times a day (QID) | RESPIRATORY_TRACT | 1 refills | Status: DC | PRN

## 2024-02-27 MED ORDER — PREDNISONE 20 MG PO TABS: 40.0000 mg | ORAL_TABLET | Freq: Every day | ORAL | 0 refills | Status: AC

## 2024-02-27 MED ORDER — VITAMIN D (ERGOCALCIFEROL) 1.25 MG (50000 UNIT) PO CAPS: 50000.0000 [IU] | ORAL_CAPSULE | ORAL | 3 refills | Status: AC

## 2024-02-27 MED ORDER — LORAZEPAM 0.5 MG PO TABS: ORAL_TABLET | ORAL | 0 refills | Status: DC

## 2024-02-27 NOTE — Patient Instructions (Signed)
 Return that cologuard for colon cancer screening ASAP Schedule Mammogram ASAP

## 2024-02-27 NOTE — Progress Notes (Signed)
 Amanda York is a 58 y.o. female presents to office today for annual physical exam examination.    She reports that she has been doing pretty good.  She does report several day history of increasingly productive cough, shortness of breath and wheezing.  She think she is having a COPD exacerbation.  She has been cleaning out an old house after the passing of her brother-in-law and was exposed to a lot of dust and other particles.  She thinks this is what has flared her up.  She denies any hemoptysis.  No chest pain.  No fevers.  Utilizing her Trelegy regularly and has started adding back in the albuterol  over the last several days to help with the wheezing.  She continues to smoke but is down to less than half a pack per day from over 1 pack/day for over 40 years.  She has not had any lung cancer screening since 2020 but would be amenable to going back to doing them.  She has Cologuard at home but has not yet returned it.  Denies any rectal bleeding, vaginal bleeding.  No unplanned weight loss, changes in bowel habits or abdominal pain.  Marital status: married, Substance use: tobacco Health Maintenance Due  Topic Date Due   Hepatitis B Vaccines 19-59 Average Risk (1 of 3 - 19+ 3-dose series) Never done   Fecal DNA (Cologuard)  09/30/2023   Mammogram  01/10/2024    Immunization History  Administered Date(s) Administered   PFIZER(Purple Top)SARS-COV-2 Vaccination 10/31/2019, 11/21/2019   Tdap 05/21/2018   Zoster Recombinant(Shingrix ) 05/21/2018, 12/25/2018   Past Medical History:  Diagnosis Date   Arthritis    Atypical chest pain 10/20/2014   Chest pain 10/19/2014   COPD (chronic obstructive pulmonary disease) (HCC)    Hypertension    Hypothyroidism    Tobacco abuse    Social History   Socioeconomic History   Marital status: Married    Spouse name: Not on file   Number of children: Not on file   Years of education: Not on file   Highest education level: Not on file   Occupational History   Not on file  Tobacco Use   Smoking status: Every Day    Current packs/day: 1.00    Average packs/day: 1 pack/day for 41.0 years (41.0 ttl pk-yrs)    Types: Cigarettes   Smokeless tobacco: Never   Tobacco comments:    Since age 69  Vaping Use   Vaping status: Never Used  Substance and Sexual Activity   Alcohol use: No    Alcohol/week: 0.0 standard drinks of alcohol   Drug use: No   Sexual activity: Yes  Other Topics Concern   Not on file  Social History Narrative   Not on file   Social Drivers of Health   Financial Resource Strain: Low Risk  (03/26/2023)   Overall Financial Resource Strain (CARDIA)    Difficulty of Paying Living Expenses: Not hard at all  Food Insecurity: No Food Insecurity (03/26/2023)   Hunger Vital Sign    Worried About Running Out of Food in the Last Year: Never true    Ran Out of Food in the Last Year: Never true  Transportation Needs: No Transportation Needs (03/26/2023)   PRAPARE - Administrator, Civil Service (Medical): No    Lack of Transportation (Non-Medical): No  Physical Activity: Inactive (03/26/2023)   Exercise Vital Sign    Days of Exercise per Week: 0 days    Minutes  of Exercise per Session: 0 min  Stress: No Stress Concern Present (03/26/2023)   [employer] of Occupational Health - Occupational Stress Questionnaire    Feeling of Stress : Not at all  Social Connections: Socially Isolated (03/26/2023)   Social Connection and Isolation Panel    Frequency of Communication with Friends and Family: Never    Frequency of Social Gatherings with Friends and Family: Never    Attends Religious Services: Never    Database Administrator or Organizations: No    Attends Banker Meetings: Never    Marital Status: Married  Catering Manager Violence: Not At Risk (03/26/2023)   Humiliation, Afraid, Rape, and Kick questionnaire    Fear of Current or Ex-Partner: No    Emotionally Abused: No     Physically Abused: No    Sexually Abused: No   Past Surgical History:  Procedure Laterality Date   none     Family History  Problem Relation Age of Onset   CAD Father        Died at 71 of MI   Coronary artery disease Father    COPD Sister    CAD Paternal Uncle        Died at 24 of MI   Breast cancer Maternal Grandmother    CAD Paternal Grandfather        Details unclaer    Current Outpatient Medications:    aspirin  EC 81 MG EC tablet, Take 1 tablet (81 mg total) by mouth daily., Disp: 30 tablet, Rfl: 0   cetirizine  (ZYRTEC ) 10 MG tablet, Take 1 tablet (10 mg total) by mouth daily., Disp: 30 tablet, Rfl: 2   albuterol  (VENTOLIN  HFA) 108 (90 Base) MCG/ACT inhaler, Inhale 2 puffs into the lungs every 6 (six) hours as needed for wheezing or shortness of breath., Disp: 6.7 g, Rfl: 1   escitalopram  (LEXAPRO ) 20 MG tablet, Take 1 tablet (20 mg total) by mouth daily., Disp: 90 tablet, Rfl: 4   Fluticasone -Umeclidin-Vilant (TRELEGY ELLIPTA ) 100-62.5-25 MCG/ACT AEPB, Inhale 1 puff into the lungs daily., Disp: 180 each, Rfl: 4   levothyroxine  (SYNTHROID ) 25 MCG tablet, Take 1 tablet (25 mcg total) by mouth daily before breakfast., Disp: 90 tablet, Rfl: 4   lisinopril  (ZESTRIL ) 10 MG tablet, Take 1 tablet (10 mg total) by mouth daily., Disp: 90 tablet, Rfl: 4   Vitamin D , Ergocalciferol , (DRISDOL ) 1.25 MG (50000 UNIT) CAPS capsule, Take 1 capsule (50,000 Units total) by mouth every 7 (seven) days., Disp: 12 capsule, Rfl: 3  Allergies  Allergen Reactions   Alendronate      Reported internal allergy. ?cause but this was the only thing started around the time she was evaluated in ER.     ROS: Review of Systems Pertinent items noted in HPI and remainder of comprehensive ROS otherwise negative.    Physical exam BP 128/83   Pulse 96   Temp 97.8 F (36.6 C)   Ht 5' 1 (1.549 m)   Wt 134 lb 8 oz (61 kg)   SpO2 90%   BMI 25.41 kg/m  General appearance: alert, cooperative, appears stated  age, and no distress Head: Normocephalic, without obvious abnormality, atraumatic Eyes: negative findings: lids and lashes normal, conjunctivae and sclerae normal, corneas clear, and pupils equal, round, reactive to light and accomodation Ears: normal TM's and external ear canals both ears Nose: Nares normal. Septum midline. Mucosa normal. No drainage or sinus tenderness. Throat: False upper teeth.  Oropharynx without masses.  No sublingual masses  Neck: no adenopathy, supple, symmetrical, trachea midline, and thyroid  not enlarged, symmetric, no tenderness/mass/nodules Back: Slight increased kyphosis of thoracic spine.  Ambulating independently with normal gait and station. Lungs: Global inspiratory and expiratory wheezes with fair air movement.  No coughing or dyspnea observed on exam. Heart: regular rate and rhythm, S1, S2 normal, no murmur, click, rub or gallop Abdomen: soft, non-tender; bowel sounds normal; no masses,  no organomegaly Extremities: extremities normal, atraumatic, no cyanosis or edema and varicose veins noted Pulses: 2+ and symmetric Skin: Few cherry angiomas noted on the back.  She has some solar lentigo on the upper extremities.  Several varicose vein patches throughout the upper extremities, right shoulder Lymph nodes: No supraclavicular or anterior cervical lymph node enlargement Neurologic: Alert and oriented X 3, normal strength and tone. Normal symmetric reflexes. Normal coordination and gait      02/27/2024    9:09 AM 01/08/2024   10:45 AM 01/03/2024    9:17 AM  Depression screen PHQ 2/9  Decreased Interest 0 0 0  Down, Depressed, Hopeless 0 0 0  PHQ - 2 Score 0 0 0  Altered sleeping 0    Tired, decreased energy 0    Change in appetite 0    Feeling bad or failure about yourself  0    Trouble concentrating 0    Moving slowly or fidgety/restless 0    Suicidal thoughts 0    PHQ-9 Score 0    Difficult doing work/chores Not difficult at all        02/27/2024     9:10 AM 01/08/2024   10:45 AM 01/03/2024    9:17 AM 03/26/2023    1:34 PM  GAD 7 : Generalized Anxiety Score  Nervous, Anxious, on Edge 0 0 0 0  Control/stop worrying 0 0 0 0  Worry too much - different things 0 0 0 0  Trouble relaxing 0 0 0 0  Restless 0 0 0 0  Easily annoyed or irritable 0 0 0 0  Afraid - awful might happen 0 0 0 0  Total GAD 7 Score 0 0 0 0  Anxiety Difficulty Not difficult at all  Not difficult at all      Assessment/ Plan: Jon GORMAN Polite here for annual physical exam.   Annual physical exam  COPD with acute exacerbation (HCC) - Plan: predniSONE  (DELTASONE ) 20 MG tablet, doxycycline  (VIBRA -TABS) 100 MG tablet, methylPREDNISolone  acetate (DEPO-MEDROL ) injection 40 mg  Centrilobular emphysema (HCC) - Plan: CBC with Differential, albuterol  (VENTOLIN  HFA) 108 (90 Base) MCG/ACT inhaler, Fluticasone -Umeclidin-Vilant (TRELEGY ELLIPTA ) 200-62.5-25 MCG/ACT AEPB, Ambulatory Referral Lung Cancer Screening [facility], LORazepam  (ATIVAN ) 0.5 MG tablet, DISCONTINUED: Fluticasone -Umeclidin-Vilant (TRELEGY ELLIPTA ) 100-62.5-25 MCG/ACT AEPB  Tobacco abuse - Plan: Ambulatory Referral Lung Cancer Screening [facility], LORazepam  (ATIVAN ) 0.5 MG tablet  Acquired hypothyroidism - Plan: CMP14+EGFR, TSH + free T4, levothyroxine  (SYNTHROID ) 25 MCG tablet  Age-related osteoporosis without current pathological fracture - Plan: CMP14+EGFR, VITAMIN D  25 Hydroxy (Vit-D Deficiency, Fractures), Vitamin D , Ergocalciferol , (DRISDOL ) 1.25 MG (50000 UNIT) CAPS capsule  Pure hypercholesterolemia - Plan: CMP14+EGFR, Lipid Panel  Essential hypertension - Plan: CMP14+EGFR, lisinopril  (ZESTRIL ) 10 MG tablet  Generalized anxiety disorder - Plan: escitalopram  (LEXAPRO ) 20 MG tablet, LORazepam  (ATIVAN ) 0.5 MG tablet  Major depressive disorder with single episode, in partial remission - Plan: escitalopram  (LEXAPRO ) 20 MG tablet  Hepatitis B vaccination status unknown - Plan: Hepatitis B  surface antibody,quantitative   Having COPD exacerbation.  Doxycycline , prednisone  and Depo-Medrol  given.  I have renewed  her albuterol  inhaler for as needed use.  Will go ahead and increase her Trelegy to 200 mcg daily.  Reinforced ongoing smoking cessation and I have referred her for lung cancer screening.  Ativan  given for as needed use prior to scan because she has claustrophobia though I reinforced that this will be an open scan most likely  She is clinically euthyroid.  Check thyroid  levels.  Synthroid  renewed  Check vitamin D  level, calcium  level given known osteoporosis.  Not currently treated with anything except for supplements  Fasting lipid collected.  Not currently treated with statin  Blood pressure controlled with lisinopril .  Renewal sent  Lexapro  renewed and this is working well to control her generalized anxiety disorder and depressive disorder  Hepatitis B antibodies collected though not sure that she will take vaccination as she declined other vaccinations today  Reinforced need to schedule mammogram and return Cologuard ASAP  The Narcotic Database has been reviewed.  There were no red flags.     Counseled on healthy lifestyle choices, including diet (rich in fruits, vegetables and lean meats and low in salt and simple carbohydrates) and exercise (at least 30 minutes of moderate physical activity daily).  Patient to follow up 19m for f/u COPD  Abbigail Anstey M. Jolinda, DO

## 2024-02-28 LAB — CMP14+EGFR
ALT: 13 IU/L (ref 0–32)
AST: 19 IU/L (ref 0–40)
Albumin: 4.5 g/dL (ref 3.8–4.9)
Alkaline Phosphatase: 68 IU/L (ref 49–135)
BUN/Creatinine Ratio: 7 — ABNORMAL LOW (ref 9–23)
BUN: 4 mg/dL — ABNORMAL LOW (ref 6–24)
Bilirubin Total: 0.7 mg/dL (ref 0.0–1.2)
CO2: 24 mmol/L (ref 20–29)
Calcium: 9.7 mg/dL (ref 8.7–10.2)
Chloride: 93 mmol/L — ABNORMAL LOW (ref 96–106)
Creatinine, Ser: 0.56 mg/dL — ABNORMAL LOW (ref 0.57–1.00)
Globulin, Total: 2 g/dL (ref 1.5–4.5)
Glucose: 85 mg/dL (ref 70–99)
Potassium: 4.8 mmol/L (ref 3.5–5.2)
Sodium: 134 mmol/L (ref 134–144)
Total Protein: 6.5 g/dL (ref 6.0–8.5)
eGFR: 105 mL/min/1.73 (ref >59)

## 2024-02-28 LAB — CBC WITH DIFFERENTIAL/PLATELET
Basophils Absolute: 0.1 x10E3/uL (ref 0.0–0.2)
Basos: 1 %
EOS (ABSOLUTE): 0.1 x10E3/uL (ref 0.0–0.4)
Eos: 1 %
Hematocrit: 47.8 % — ABNORMAL HIGH (ref 34.0–46.6)
Hemoglobin: 15.6 g/dL (ref 11.1–15.9)
Immature Grans (Abs): 0 x10E3/uL (ref 0.0–0.1)
Immature Granulocytes: 0 %
Lymphocytes Absolute: 1.2 x10E3/uL (ref 0.7–3.1)
Lymphs: 19 %
MCH: 29.5 pg (ref 26.6–33.0)
MCHC: 32.6 g/dL (ref 31.5–35.7)
MCV: 90 fL (ref 79–97)
Monocytes Absolute: 0.5 x10E3/uL (ref 0.1–0.9)
Monocytes: 7 %
Neutrophils Absolute: 4.6 x10E3/uL (ref 1.4–7.0)
Neutrophils: 72 %
Platelets: 286 x10E3/uL (ref 150–450)
RBC: 5.29 x10E6/uL — ABNORMAL HIGH (ref 3.77–5.28)
RDW: 13.8 % (ref 11.7–15.4)
WBC: 6.4 x10E3/uL (ref 3.4–10.8)

## 2024-02-28 LAB — LIPID PANEL
Chol/HDL Ratio: 3.6 ratio (ref 0.0–4.4)
Cholesterol, Total: 236 mg/dL — ABNORMAL HIGH (ref 100–199)
HDL: 66 mg/dL (ref >39)
LDL Chol Calc (NIH): 155 mg/dL — ABNORMAL HIGH (ref 0–99)
Triglycerides: 86 mg/dL (ref 0–149)
VLDL Cholesterol Cal: 15 mg/dL (ref 5–40)

## 2024-02-28 LAB — TSH+FREE T4
Free T4: 1.42 ng/dL (ref 0.82–1.77)
TSH: 1.21 u[IU]/mL (ref 0.450–4.500)

## 2024-02-28 LAB — VITAMIN D 25 HYDROXY (VIT D DEFICIENCY, FRACTURES): Vit D, 25-Hydroxy: 54.1 ng/mL (ref 30.0–100.0)

## 2024-02-28 LAB — HEPATITIS B SURFACE ANTIBODY, QUANTITATIVE: Hepatitis B Surf Ab Quant: <3.5 m[IU]/mL — AB

## 2024-03-03 ENCOUNTER — Telehealth: Payer: Self-pay | Admitting: Pharmacy Technician

## 2024-03-03 ENCOUNTER — Other Ambulatory Visit (HOSPITAL_COMMUNITY): Payer: Self-pay

## 2024-03-03 ENCOUNTER — Ambulatory Visit: Payer: Self-pay | Admitting: Family Medicine

## 2024-03-03 DIAGNOSIS — E7800 Pure hypercholesterolemia, unspecified: Secondary | ICD-10-CM

## 2024-03-03 NOTE — Telephone Encounter (Signed)
 Pharmacy Patient Advocate Encounter   Received notification from Onbase that prior authorization for Trelegy Ellipta  200-62.5-25 mcg is required/requested.   Insurance verification completed.   The patient is insured through [employer].   Per test claim: The current 30 day co-pay is, $50.00.  No PA needed at this time. This test claim was processed through [facility]- copay amounts may vary at other pharmacies due to pharmacy/plan contracts, or as the patient moves through the different stages of their insurance plan.    **The prescription was written for 3 months but the insurance will only cover 1 month at a time at retail level**

## 2024-03-05 ENCOUNTER — Other Ambulatory Visit: Payer: Self-pay

## 2024-03-05 DIAGNOSIS — E7800 Pure hypercholesterolemia, unspecified: Secondary | ICD-10-CM

## 2024-03-05 MED ORDER — ROSUVASTATIN CALCIUM 10 MG PO TABS: 10.0000 mg | ORAL_TABLET | Freq: Every day | ORAL | 3 refills | Status: DC

## 2024-03-17 ENCOUNTER — Other Ambulatory Visit (HOSPITAL_COMMUNITY): Payer: Self-pay

## 2024-03-17 ENCOUNTER — Telehealth: Payer: Self-pay

## 2024-03-17 NOTE — Telephone Encounter (Signed)
 Copied from CRM #[id number]. Topic: Clinical - Medication Prior Auth >> Mar 17, 2024  9:06 AM Diannia H wrote: Reason for CRM: Patient calling to check the status of Fluticasone -Umeclidin-Vilant (TRELEGY ELLIPTA ) 200-62.5-25 MCG/ACT AEPB. She stated the pharmacy told her to contact her insurance company because she is needing prior auth. Could you assist? Could you call her with an update? Patients callback number is [phone]. She only has about a two day supply left and needs to take them everyday.

## 2024-03-19 ENCOUNTER — Other Ambulatory Visit (HOSPITAL_COMMUNITY): Payer: Self-pay

## 2024-03-19 ENCOUNTER — Telehealth: Payer: Self-pay | Admitting: Pharmacy Technician

## 2024-03-19 NOTE — Telephone Encounter (Signed)
 Pharmacy Patient Advocate Encounter   Received notification from Pt Calls Messages that prior authorization for Trelegy Ellipta  is required/requested.   Insurance verification completed.   The patient is insured through [facility].   Per test claim: The current 30 day co-pay is, $50.00.  No PA needed at this time. This test claim was processed through [facility]- copay amounts may vary at other pharmacies due to pharmacy/plan contracts, or as the patient moves through the different stages of their insurance plan.     **I also called [facility] and spoke with Francis and apparently they did not have her [facility] information which I gave him and he is filling her trelegy inhaler this morning**

## 2024-03-19 NOTE — Telephone Encounter (Signed)
 Good morning, I will call [facility] when they open after 9 am as I'm not sure what the problem is because when I test billed my claim went through and her copay was $50.00

## 2024-03-20 NOTE — Telephone Encounter (Signed)
 Taken care of in a new encounter.

## 2024-03-20 NOTE — Telephone Encounter (Signed)
 Per DPR left detailed message, advised patient to call back with any questions.

## 2024-03-24 ENCOUNTER — Emergency Department (HOSPITAL_COMMUNITY)

## 2024-03-24 ENCOUNTER — Emergency Department (HOSPITAL_COMMUNITY)
Admission: EM | Admit: 2024-03-24 | Discharge: 2024-03-24 | Disposition: A | Discharge status: Home / Self Care | Attending: Emergency Medicine | Admitting: Emergency Medicine

## 2024-03-24 ENCOUNTER — Encounter (HOSPITAL_COMMUNITY): Payer: Self-pay

## 2024-03-24 ENCOUNTER — Ambulatory Visit: Payer: Self-pay

## 2024-03-24 ENCOUNTER — Other Ambulatory Visit: Payer: Self-pay

## 2024-03-24 DIAGNOSIS — E039 Hypothyroidism, unspecified: Secondary | ICD-10-CM | POA: Diagnosis not present

## 2024-03-24 DIAGNOSIS — R059 Cough, unspecified: Secondary | ICD-10-CM | POA: Diagnosis present

## 2024-03-24 DIAGNOSIS — F17210 Nicotine dependence, cigarettes, uncomplicated: Secondary | ICD-10-CM | POA: Diagnosis not present

## 2024-03-24 DIAGNOSIS — J101 Influenza due to other identified influenza virus with other respiratory manifestations: Secondary | ICD-10-CM | POA: Diagnosis not present

## 2024-03-24 DIAGNOSIS — J441 Chronic obstructive pulmonary disease with (acute) exacerbation: Secondary | ICD-10-CM | POA: Insufficient documentation

## 2024-03-24 DIAGNOSIS — Z7982 Long term (current) use of aspirin: Secondary | ICD-10-CM | POA: Diagnosis not present

## 2024-03-24 DIAGNOSIS — Z79899 Other long term (current) drug therapy: Secondary | ICD-10-CM | POA: Insufficient documentation

## 2024-03-24 DIAGNOSIS — I10 Essential (primary) hypertension: Secondary | ICD-10-CM | POA: Diagnosis not present

## 2024-03-24 DIAGNOSIS — J111 Influenza due to unidentified influenza virus with other respiratory manifestations: Secondary | ICD-10-CM

## 2024-03-24 LAB — RESP PANEL BY RT-PCR (RSV, FLU A&B, COVID)  RVPGX2
Influenza A by PCR: POSITIVE — AB
Influenza B by PCR: NEGATIVE
Resp Syncytial Virus by PCR: NEGATIVE
SARS Coronavirus 2 by RT PCR: NEGATIVE

## 2024-03-24 LAB — CBC WITH DIFFERENTIAL/PLATELET
Abs Immature Granulocytes: 0.01 K/uL (ref 0.00–0.07)
Basophils Absolute: 0 K/uL (ref 0.0–0.1)
Basophils Relative: 0 %
Eosinophils Absolute: 0 K/uL (ref 0.0–0.5)
Eosinophils Relative: 0 %
HCT: 47.5 % — ABNORMAL HIGH (ref 36.0–46.0)
Hemoglobin: 16.1 g/dL — ABNORMAL HIGH (ref 12.0–15.0)
Immature Granulocytes: 0 %
Lymphocytes Relative: 14 %
Lymphs Abs: 0.8 K/uL (ref 0.7–4.0)
MCH: 29.8 pg (ref 26.0–34.0)
MCHC: 33.9 g/dL (ref 30.0–36.0)
MCV: 88 fL (ref 80.0–100.0)
Monocytes Absolute: 0.5 K/uL (ref 0.1–1.0)
Monocytes Relative: 8 %
Neutro Abs: 4.3 K/uL (ref 1.7–7.7)
Neutrophils Relative %: 78 %
Platelets: 208 K/uL (ref 150–400)
RBC: 5.4 MIL/uL — ABNORMAL HIGH (ref 3.87–5.11)
RDW: 14.7 % (ref 11.5–15.5)
WBC: 5.6 K/uL (ref 4.0–10.5)
nRBC: 0 % (ref 0.0–0.2)

## 2024-03-24 LAB — BASIC METABOLIC PANEL WITH GFR
Anion gap: 15 (ref 5–15)
BUN: 7 mg/dL (ref 6–20)
CO2: 25 mmol/L (ref 22–32)
Calcium: 9.1 mg/dL (ref 8.9–10.3)
Chloride: 93 mmol/L — ABNORMAL LOW (ref 98–111)
Creatinine, Ser: 0.74 mg/dL (ref 0.44–1.00)
GFR, Estimated: >60 mL/min
Glucose, Bld: 90 mg/dL (ref 70–99)
Potassium: 3.8 mmol/L (ref 3.5–5.1)
Sodium: 132 mmol/L — ABNORMAL LOW (ref 135–145)

## 2024-03-24 MED ORDER — IPRATROPIUM-ALBUTEROL 0.5-2.5 (3) MG/3ML IN SOLN
3.0000 mL | Freq: Once | RESPIRATORY_TRACT | Status: AC
  Administered 2024-03-24: 3 mL via RESPIRATORY_TRACT
  Filled 2024-03-24: qty 3

## 2024-03-24 MED ORDER — OSELTAMIVIR PHOSPHATE 75 MG PO CAPS: 75.0000 mg | ORAL_CAPSULE | Freq: Two times a day (BID) | ORAL | 0 refills | Status: DC

## 2024-03-24 MED ORDER — PREDNISONE 50 MG PO TABS: 50.0000 mg | ORAL_TABLET | Freq: Every day | ORAL | 0 refills | Status: DC

## 2024-03-24 MED ORDER — ACETAMINOPHEN 500 MG PO TABS
1000.0000 mg | ORAL_TABLET | Freq: Once | ORAL | Status: AC
  Administered 2024-03-24: 1000 mg via ORAL
  Filled 2024-03-24: qty 2

## 2024-03-24 MED ORDER — METHYLPREDNISOLONE SODIUM SUCC 125 MG IJ SOLR
125.0000 mg | Freq: Once | INTRAMUSCULAR | Status: AC
  Administered 2024-03-24: 125 mg via INTRAVENOUS
  Filled 2024-03-24: qty 2

## 2024-03-24 MED ORDER — OSELTAMIVIR PHOSPHATE 75 MG PO CAPS
75.0000 mg | ORAL_CAPSULE | Freq: Once | ORAL | Status: AC
  Administered 2024-03-24: 75 mg via ORAL
  Filled 2024-03-24: qty 1

## 2024-03-24 MED ORDER — SODIUM CHLORIDE 0.9 % IV BOLUS
1000.0000 mL | Freq: Once | INTRAVENOUS | Status: AC
  Administered 2024-03-24: 1000 mL via INTRAVENOUS

## 2024-03-24 MED ORDER — IPRATROPIUM-ALBUTEROL 0.5-2.5 (3) MG/3ML IN SOLN: 3.0000 mL | Freq: Four times a day (QID) | RESPIRATORY_TRACT | 0 refills | Status: DC | PRN

## 2024-03-24 NOTE — ED Triage Notes (Signed)
 Pt arrived via POV from home c/o cough, headache, chills, weakness, fatigue, low oxygen at home reporting 82% on room air and a fever of 102.28F at home. Pt reports taking OTC medication for her fever and symptoms with minimal relief.

## 2024-03-24 NOTE — ED Provider Notes (Signed)
 " [place] EMERGENCY DEPARTMENT AT [facility] Provider Note  CSN: [id number] Arrival date & time: 03/24/24 1720  Chief Complaint(s) Cough  HPI LATOIA EYSTER is a 58 y.o. female history of COPD, hypertension presenting to the emergency department shortness of breath.  Reports grandson was sick with some type of infection.  Patient today and yesterday experiencing cough which is nonproductive, generalized weakness, chills, headache, body aches, fever.  Reports low oxygen level at home.  No abdominal pain.  She reports some chest tightness but not pain.  No abdominal pain.  No leg swelling.  No other new symptoms   Past Medical History Past Medical History:  Diagnosis Date   Arthritis    Atypical chest pain 10/20/2014   Chest pain 10/19/2014   COPD (chronic obstructive pulmonary disease) (HCC)    Hypertension    Hypothyroidism    Tobacco abuse    Patient Active Problem List   Diagnosis Date Noted   Pure hypercholesterolemia 03/26/2023   Age-related osteoporosis without current pathological fracture 03/26/2023   Acquired hypothyroidism 03/26/2023   Vitamin D  deficiency 10/07/2020   Generalized anxiety disorder 06/04/2018   Depression 04/28/2015   Centrilobular emphysema (HCC) 10/20/2014   Tobacco abuse 10/20/2014   Essential hypertension 10/19/2014   Home Medication(s) Prior to Admission medications  Medication Sig Start Date End Date Taking? Authorizing Provider  ipratropium-albuterol  (DUONEB) 0.5-2.5 (3) MG/3ML SOLN Take 3 mLs by nebulization every 6 (six) hours as needed. 03/24/24  Yes Francesca Elsie CROME, MD  oseltamivir  (TAMIFLU ) 75 MG capsule Take 1 capsule (75 mg total) by mouth every 12 (twelve) hours. 03/24/24  Yes Francesca Elsie CROME, MD  predniSONE  (DELTASONE ) 50 MG tablet Take 1 tablet (50 mg total) by mouth daily with breakfast for 5 days. 03/25/24 03/30/24 Yes Francesca Elsie CROME, MD  albuterol  (VENTOLIN  HFA) 108 (90 Base) MCG/ACT inhaler Inhale 2  puffs into the lungs every 6 (six) hours as needed for wheezing or shortness of breath. 02/27/24   [name], DO  aspirin  EC 81 MG EC tablet Take 1 tablet (81 mg total) by mouth daily. 10/20/14   Dhungel, Barnetta, MD  cetirizine  (ZYRTEC ) 10 MG tablet Take 1 tablet (10 mg total) by mouth daily. 09/14/20   [name], DO  escitalopram  (LEXAPRO ) 20 MG tablet Take 1 tablet (20 mg total) by mouth daily. 02/27/24   [name], DO  Fluticasone -Umeclidin-Vilant (TRELEGY ELLIPTA ) 200-62.5-25 MCG/ACT AEPB Inhale 1 puff into the lungs daily. **DOSE CHANGE 02/27/24   Jolinda Norene M, DO  levothyroxine  (SYNTHROID ) 25 MCG tablet Take 1 tablet (25 mcg total) by mouth daily before breakfast. 02/27/24   Jolinda Norene M, DO  lisinopril  (ZESTRIL ) 10 MG tablet Take 1 tablet (10 mg total) by mouth daily. 02/27/24   [name], DO  LORazepam  (ATIVAN ) 0.5 MG tablet Take 1 tablet 1 hour before scheduled scan. May repeat dose 1 time before scan if needed 02/27/24   Jolinda Norene M, DO  rosuvastatin  (CRESTOR ) 10 MG tablet Take 1 tablet (10 mg total) by mouth daily. 03/05/24   [name], DO  Vitamin D , Ergocalciferol , (DRISDOL ) 1.25 MG (50000 UNIT) CAPS capsule Take 1 capsule (50,000 Units total) by mouth every 7 (seven) days. 02/27/24   [name], DO  Past Surgical History Past Surgical History:  Procedure Laterality Date   none     Family History Family History  Problem Relation Age of Onset   CAD Father        Died at 57 of MI   Coronary artery disease Father    COPD Sister    CAD Paternal Uncle        Died at 40 of MI   Breast cancer Maternal Grandmother    CAD Paternal Grandfather        Details unclaer    Social History Social History[1] Allergies Alendronate   Review of Systems Review of Systems  All other  systems reviewed and are negative.   Physical Exam Vital Signs  I have reviewed the triage vital signs BP 129/84 (BP Location: Right Arm)   Pulse (!) 101   Temp 98.8 F (37.1 C) (Oral)   Resp 18   Ht 5' 1 (1.549 m)   Wt 61 kg   SpO2 95%   BMI 25.41 kg/m  Physical Exam Vitals and nursing note reviewed.  Constitutional:      General: She is not in acute distress.    Appearance: She is well-developed.  HENT:     Head: Normocephalic and atraumatic.     Mouth/Throat:     Mouth: Mucous membranes are moist.  Eyes:     Pupils: Pupils are equal, round, and reactive to light.  Cardiovascular:     Rate and Rhythm: Normal rate and regular rhythm.     Heart sounds: No murmur heard. Pulmonary:     Comments: Borderline tachypnea and increased work of breathing, diffuse wheezing, no focal findings Abdominal:     General: Abdomen is flat.     Palpations: Abdomen is soft.     Tenderness: There is no abdominal tenderness.  Musculoskeletal:        General: No tenderness.     Right lower leg: No edema.     Left lower leg: No edema.  Skin:    General: Skin is warm and dry.  Neurological:     General: No focal deficit present.     Mental Status: She is alert. Mental status is at baseline.  Psychiatric:        Mood and Affect: Mood normal.        Behavior: Behavior normal.     ED Results and Treatments Labs (all labs ordered are listed, but only abnormal results are displayed) Labs Reviewed  RESP PANEL BY RT-PCR (RSV, FLU A&B, COVID)  RVPGX2 - Abnormal; Notable for the following components:      Result Value   Influenza A by PCR POSITIVE (*)    All other components within normal limits  CBC WITH DIFFERENTIAL/PLATELET - Abnormal; Notable for the following components:   RBC 5.40 (*)    Hemoglobin 16.1 (*)    HCT 47.5 (*)    All other components within normal limits  BASIC METABOLIC PANEL WITH GFR - Abnormal; Notable for the following components:   Sodium 132 (*)    Chloride  93 (*)    All other components within normal limits  Radiology DG Chest Portable 1 View Result Date: 03/24/2024 CLINICAL DATA:  Shortness of breath. EXAM: PORTABLE CHEST 1 VIEW COMPARISON:  Chest radiograph dated 02/19/2021. FINDINGS: Background of emphysema. No focal consolidation, pleural effusion, pneumothorax. The cardiac silhouette is within normal limits. No acute osseous pathology. IMPRESSION: No active disease. Electronically Signed   By: Vanetta Chou M.D.   On: 03/24/2024 18:48    Pertinent labs & imaging results that were available during my care of the patient were reviewed by me and considered in my medical decision making (see MDM for details).  Medications Ordered in ED Medications  acetaminophen  (TYLENOL ) tablet 1,000 mg (has no administration in time range)  sodium chloride  0.9 % bolus 1,000 mL (0 mLs Intravenous Stopped 03/24/24 2059)  methylPREDNISolone  sodium succinate (SOLU-MEDROL ) 125 mg/2 mL injection 125 mg (125 mg Intravenous Given 03/24/24 1858)  ipratropium-albuterol  (DUONEB) 0.5-2.5 (3) MG/3ML nebulizer solution 3 mL (3 mLs Nebulization Given 03/24/24 1822)  ipratropium-albuterol  (DUONEB) 0.5-2.5 (3) MG/3ML nebulizer solution 3 mL (3 mLs Nebulization Given 03/24/24 2036)  oseltamivir  (TAMIFLU ) capsule 75 mg (75 mg Oral Given 03/24/24 2100)                                                                                                                                     Procedures Procedures  (including critical care time)  Medical Decision Making / ED Course   MDM:  58 year old presenting to the emergency department with shortness of breath, fever, cough.  Patient is overall well-appearing, physical examination with diffuse wheezing.  Seems consistent with COPD exacerbation.  Patient is flu positive, this likely in the setting of  influenza infection.  On my interpretation chest x-ray without focal infiltrate pending formal radiology read.  She reports significant movement after receiving DuoNeb.  Will also give fluids and steroid.  Also obtain EKG given chest tightness although states this seems much more consistent with COPD exacerbation  Clinical Course as of 03/24/24 2110  Mon Mar 24, 2024  2110 Patient respiratory status seems much better.  Oxygen better.  Her heart rate has improved after IV fluids and treatment.  She feels ready to go home.  She has received Tamiflu , steroids and breathing treatment as well as fluids.  Will prescribe Tamiflu  and steroids as well as nebulizer solution.  Recommended follow-up with primary physician.  Discussed return precautions.  [WS]    Clinical Course User Index [WS] Francesca Elsie CROME, MD     Additional history obtained: -Additional history obtained from family -External records from outside source obtained and reviewed including: Chart review including previous notes, labs, imaging, consultation notes including prior notes    Lab Tests: -I ordered, reviewed, and interpreted labs.   The pertinent results include:   Labs Reviewed  RESP PANEL BY RT-PCR (RSV, FLU A&B, COVID)  RVPGX2 - Abnormal; Notable for the following components:      Result Value   Influenza A by  PCR POSITIVE (*)    All other components within normal limits  CBC WITH DIFFERENTIAL/PLATELET - Abnormal; Notable for the following components:   RBC 5.40 (*)    Hemoglobin 16.1 (*)    HCT 47.5 (*)    All other components within normal limits  BASIC METABOLIC PANEL WITH GFR - Abnormal; Notable for the following components:   Sodium 132 (*)    Chloride 93 (*)    All other components within normal limits    Notable for + flu, mild hemoconcentration  EKG   EKG Interpretation Date/Time:  Monday March 24 2024 19:08:55 EST Ventricular Rate:  99 PR Interval:  144 QRS Duration:  68 QT Interval:  333 QTC  Calculation: 428 R Axis:   78  Text Interpretation: Sinus rhythm Anteroseptal infarct, age indeterminate Confirmed by Francesca Fallow ([id number]) on 03/24/2024 7:15:13 PM         Imaging Studies ordered: I ordered imaging studies including CXR On my interpretation imaging demonstrates no acute process I independently visualized and interpreted imaging. I agree with the radiologist interpretation   Medicines ordered and prescription drug management: Meds ordered this encounter  Medications   sodium chloride  0.9 % bolus 1,000 mL   methylPREDNISolone  sodium succinate (SOLU-MEDROL ) 125 mg/2 mL injection 125 mg   ipratropium-albuterol  (DUONEB) 0.5-2.5 (3) MG/3ML nebulizer solution 3 mL   ipratropium-albuterol  (DUONEB) 0.5-2.5 (3) MG/3ML nebulizer solution 3 mL   oseltamivir  (TAMIFLU ) capsule 75 mg   acetaminophen  (TYLENOL ) tablet 1,000 mg   predniSONE  (DELTASONE ) 50 MG tablet    Sig: Take 1 tablet (50 mg total) by mouth daily with breakfast for 5 days.    Dispense:  5 tablet    Refill:  0   oseltamivir  (TAMIFLU ) 75 MG capsule    Sig: Take 1 capsule (75 mg total) by mouth every 12 (twelve) hours.    Dispense:  10 capsule    Refill:  0   ipratropium-albuterol  (DUONEB) 0.5-2.5 (3) MG/3ML SOLN    Sig: Take 3 mLs by nebulization every 6 (six) hours as needed.    Dispense:  360 mL    Refill:  0    -I have reviewed the patients home medicines and have made adjustments as needed   Cardiac Monitoring: The patient was maintained on a cardiac monitor.  I personally viewed and interpreted the cardiac monitored which showed an underlying rhythm of: sinus rhythm   Social Determinants of Health:  Diagnosis or treatment significantly limited by social determinants of health: current smoker   Reevaluation: After the interventions noted above, I reevaluated the patient and found that their symptoms have improved  Co morbidities that complicate the patient evaluation  Past Medical  History:  Diagnosis Date   Arthritis    Atypical chest pain 10/20/2014   Chest pain 10/19/2014   COPD (chronic obstructive pulmonary disease) (HCC)    Hypertension    Hypothyroidism    Tobacco abuse       Dispostion: Disposition decision including need for hospitalization was considered, and patient discharged from emergency department.    Final Clinical Impression(s) / ED Diagnoses Final diagnoses:  Influenza  COPD exacerbation (HCC)     This chart was dictated using voice recognition software.  Despite best efforts to proofread,  errors can occur which can change the documentation meaning.     [1]  Social History Tobacco Use   Smoking status: Every Day    Current packs/day: 1.00    Average packs/day: 1 pack/day for 41.0 years (41.0  ttl pk-yrs)    Types: Cigarettes    Passive exposure: Current   Smokeless tobacco: Never   Tobacco comments:    Since age 66  Vaping Use   Vaping status: Never Used  Substance Use Topics   Alcohol use: No    Alcohol/week: 0.0 standard drinks of alcohol   Drug use: No     Francesca Elsie CROME, MD 03/24/24 2111  "

## 2024-03-24 NOTE — Telephone Encounter (Signed)
Noted  -LS

## 2024-03-24 NOTE — Telephone Encounter (Signed)
 FYI Only or Action Required?: FYI only for provider: ED advised.  Patient was last seen in primary care on 02/27/2024 by Amanda Norene HERO, DO.  Called Nurse Triage reporting Influenza.  Symptoms began yesterday.  Interventions attempted: OTC medications: Ibuprofen, tylenol .  Symptoms are: gradually worsening.  Triage Disposition: Go to ED Now (Notify PCP)  Patient/caregiver understands and will follow disposition?: Yes   Yesterday onset body aches with 102.9 F fever, headache, dry cough and increased SOB. Concerned she may have the flu. [name] recently came home with the flu on Friday. Has hx of COPD, normally has mild SOB intermittently. Now has mild SOB at rest with moderate to severe S with exertion. O2 normally 93-97%, currently 88%. Woke up this morning with an O2 of 82%, improved after taking trelegy. Normally wakes up with O2 of 86-87% in the morning. Does not wear O2. Reports moderate intermittent wheezing. Intermittent tight burning 7/10 CP in the center of chest with increased anxiety r/t increased SOB. Resolves as pt settles down, reports this has happened in the past. Feels like heart is beating fast, HR 118. Normally in the 80s. Has not taken a  flu or covid test. Advised ED, agreeable to go and has someone that can take her. Advised 911 for worsening symptoms.    Copied from CRM #[id number]. Topic: Clinical - Red Word Triage >> Mar 24, 2024  3:33 PM Amanda York wrote: [facility] that prompted transfer to Nurse Triage: possible flu, body aches, fever (102.9 - won't go down ), copd worse ( shortness of breath ) Reason for Disposition  Oxygen level (e.York., pulse oximetry) 90% or lower  Answer Assessment - Initial Assessment Questions 1. RESPIRATORY STATUS: Describe your breathing? (e.York., wheezing, shortness of breath, unable to speak, severe coughing)      Increased SOB at rest and with exertion, cough  2. ONSET: When did this breathing problem begin?      Yesterday  3.  PATTERN Does the difficult breathing come and go, or has it been constant since it started?      Constant, fluctuates  4. SEVERITY: How bad is your breathing? (e.York., mild, moderate, severe)      Mild SOB at rest, with moderate to severe SOB with exertion. Normally has mild intermittent SOB at baseline.  5. RECURRENT SYMPTOM: Have you had difficulty breathing before? If Yes, ask: When was the last time? and What happened that time?      Denies  6. CARDIAC HISTORY: Do you have any history of heart disease? (e.York., heart attack, angina, bypass surgery, angioplasty)      Denies  7. LUNG HISTORY: Do you have any history of lung disease?  (e.York., pulmonary embolus, asthma, emphysema)     COPD  8. CAUSE: What do you think is causing the breathing problem?      Flu. Her grandson recently came home with the flu on Friday.  9. OTHER SYMPTOMS: Do you have any other symptoms? (e.York., chest pain, cough, dizziness, fever, runny nose)     Body aches, 102.9 F fever, headache, dry cough, intermittent 7/10 tight burning pain in chest of chest tied to increased anxiety, resolves as the pt settles down  10. O2 SATURATION MONITOR:  Do you use an oxygen saturation monitor (pulse oximeter) at home? If Yes, ask: What is your reading (oxygen level) today? What is your usual oxygen saturation reading? (e.York., 95%)       88% Currently, normally 97%  Protocols used: Breathing Difficulty-A-AH

## 2024-03-24 NOTE — Discharge Instructions (Signed)
 We evaluated you for your shortness of breath.  Your testing shows that you have the flu.  We have prescribed you an antiviral medication and we have prescribed you steroids for your COPD.  Please take these as prescribed.  We have also sent a prescription for nebulizer solution that you can take every 6 hours as needed in addition to your regular inhaler.  Please follow-up with your primary doctor.  Please return if your symptoms worsen.

## 2024-03-24 NOTE — Progress Notes (Signed)
 Patient's hands were very cold, patient stated she felt her temp may be going back up.   Checked temp axillary since she was getting a breathing treatment; temp was 100.2.  Will notify RN.

## 2024-03-29 ENCOUNTER — Inpatient Hospital Stay (HOSPITAL_COMMUNITY)
Admission: EM | Admit: 2024-03-29 | Discharge: 2024-04-01 | DRG: 193 | Disposition: A | Discharge status: Home / Self Care | Attending: Family Medicine | Admitting: Family Medicine

## 2024-03-29 ENCOUNTER — Emergency Department (HOSPITAL_COMMUNITY)

## 2024-03-29 ENCOUNTER — Other Ambulatory Visit: Payer: Self-pay

## 2024-03-29 ENCOUNTER — Encounter (HOSPITAL_COMMUNITY): Payer: Self-pay

## 2024-03-29 DIAGNOSIS — E039 Hypothyroidism, unspecified: Secondary | ICD-10-CM | POA: Diagnosis present

## 2024-03-29 DIAGNOSIS — J101 Influenza due to other identified influenza virus with other respiratory manifestations: Principal | ICD-10-CM | POA: Diagnosis present

## 2024-03-29 DIAGNOSIS — F32A Depression, unspecified: Secondary | ICD-10-CM | POA: Diagnosis present

## 2024-03-29 DIAGNOSIS — J432 Centrilobular emphysema: Secondary | ICD-10-CM

## 2024-03-29 DIAGNOSIS — Z79890 Hormone replacement therapy (postmenopausal): Secondary | ICD-10-CM

## 2024-03-29 DIAGNOSIS — Z91148 Patient's other noncompliance with medication regimen for other reason: Secondary | ICD-10-CM

## 2024-03-29 DIAGNOSIS — E7800 Pure hypercholesterolemia, unspecified: Secondary | ICD-10-CM

## 2024-03-29 DIAGNOSIS — R5381 Other malaise: Secondary | ICD-10-CM | POA: Diagnosis present

## 2024-03-29 DIAGNOSIS — Z888 Allergy status to other drugs, medicaments and biological substances status: Secondary | ICD-10-CM

## 2024-03-29 DIAGNOSIS — I10 Essential (primary) hypertension: Secondary | ICD-10-CM | POA: Diagnosis present

## 2024-03-29 DIAGNOSIS — E785 Hyperlipidemia, unspecified: Secondary | ICD-10-CM | POA: Diagnosis present

## 2024-03-29 DIAGNOSIS — F411 Generalized anxiety disorder: Secondary | ICD-10-CM

## 2024-03-29 DIAGNOSIS — E876 Hypokalemia: Secondary | ICD-10-CM | POA: Diagnosis present

## 2024-03-29 DIAGNOSIS — Z1152 Encounter for screening for COVID-19: Secondary | ICD-10-CM

## 2024-03-29 DIAGNOSIS — J441 Chronic obstructive pulmonary disease with (acute) exacerbation: Secondary | ICD-10-CM | POA: Diagnosis not present

## 2024-03-29 DIAGNOSIS — Z7982 Long term (current) use of aspirin: Secondary | ICD-10-CM

## 2024-03-29 DIAGNOSIS — J9601 Acute respiratory failure with hypoxia: Secondary | ICD-10-CM | POA: Diagnosis present

## 2024-03-29 DIAGNOSIS — Z720 Tobacco use: Secondary | ICD-10-CM | POA: Diagnosis present

## 2024-03-29 DIAGNOSIS — Z803 Family history of malignant neoplasm of breast: Secondary | ICD-10-CM

## 2024-03-29 DIAGNOSIS — R0902 Hypoxemia: Secondary | ICD-10-CM

## 2024-03-29 DIAGNOSIS — F17210 Nicotine dependence, cigarettes, uncomplicated: Secondary | ICD-10-CM | POA: Diagnosis present

## 2024-03-29 DIAGNOSIS — F419 Anxiety disorder, unspecified: Secondary | ICD-10-CM | POA: Diagnosis present

## 2024-03-29 DIAGNOSIS — Z8249 Family history of ischemic heart disease and other diseases of the circulatory system: Secondary | ICD-10-CM

## 2024-03-29 DIAGNOSIS — F324 Major depressive disorder, single episode, in partial remission: Secondary | ICD-10-CM

## 2024-03-29 DIAGNOSIS — Z825 Family history of asthma and other chronic lower respiratory diseases: Secondary | ICD-10-CM

## 2024-03-29 DIAGNOSIS — Z716 Tobacco abuse counseling: Secondary | ICD-10-CM

## 2024-03-29 DIAGNOSIS — Z79899 Other long term (current) drug therapy: Secondary | ICD-10-CM

## 2024-03-29 LAB — BLOOD GAS, VENOUS
Acid-Base Excess: 13.1 mmol/L — ABNORMAL HIGH (ref 0.0–2.0)
Bicarbonate: 39.6 mmol/L — ABNORMAL HIGH (ref 20.0–28.0)
Drawn by: 6509
O2 Saturation: 92.3 %
Patient temperature: 36.8
pCO2, Ven: 56 mmHg (ref 44–60)
pH, Ven: 7.45 — ABNORMAL HIGH (ref 7.25–7.43)
pO2, Ven: 57 mmHg — ABNORMAL HIGH (ref 32–45)

## 2024-03-29 LAB — BASIC METABOLIC PANEL WITH GFR
Anion gap: 3 — ABNORMAL LOW (ref 5–15)
BUN: <5 mg/dL — ABNORMAL LOW (ref 6–20)
CO2: 38 mmol/L — ABNORMAL HIGH (ref 22–32)
Calcium: 9 mg/dL (ref 8.9–10.3)
Chloride: 100 mmol/L (ref 98–111)
Creatinine, Ser: 0.63 mg/dL (ref 0.44–1.00)
GFR, Estimated: >60 mL/min
Glucose, Bld: 93 mg/dL (ref 70–99)
Potassium: 3.2 mmol/L — ABNORMAL LOW (ref 3.5–5.1)
Sodium: 142 mmol/L (ref 135–145)

## 2024-03-29 LAB — CBC WITH DIFFERENTIAL/PLATELET
Abs Immature Granulocytes: 0.1 K/uL — ABNORMAL HIGH (ref 0.00–0.07)
Basophils Absolute: 0 K/uL (ref 0.0–0.1)
Basophils Relative: 1 %
Eosinophils Absolute: 0 K/uL (ref 0.0–0.5)
Eosinophils Relative: 0 %
HCT: 46.4 % — ABNORMAL HIGH (ref 36.0–46.0)
Hemoglobin: 15.2 g/dL — ABNORMAL HIGH (ref 12.0–15.0)
Immature Granulocytes: 2 %
Lymphocytes Relative: 28 %
Lymphs Abs: 1.7 K/uL (ref 0.7–4.0)
MCH: 29.5 pg (ref 26.0–34.0)
MCHC: 32.8 g/dL (ref 30.0–36.0)
MCV: 90.1 fL (ref 80.0–100.0)
Monocytes Absolute: 0.4 K/uL (ref 0.1–1.0)
Monocytes Relative: 7 %
Neutro Abs: 3.8 K/uL (ref 1.7–7.7)
Neutrophils Relative %: 62 %
Platelets: 262 K/uL (ref 150–400)
RBC: 5.15 MIL/uL — ABNORMAL HIGH (ref 3.87–5.11)
RDW: 15.2 % (ref 11.5–15.5)
WBC: 6.1 K/uL (ref 4.0–10.5)
nRBC: 0 % (ref 0.0–0.2)

## 2024-03-29 LAB — GLUCOSE, CAPILLARY: Glucose-Capillary: 388 mg/dL — ABNORMAL HIGH (ref 70–99)

## 2024-03-29 MED ORDER — ALBUTEROL SULFATE (2.5 MG/3ML) 0.083% IN NEBU: 2.5000 mg | INHALATION_SOLUTION | RESPIRATORY_TRACT | Status: DC | PRN

## 2024-03-29 MED ORDER — ASPIRIN 81 MG PO TBEC
81.0000 mg | DELAYED_RELEASE_TABLET | Freq: Every day | ORAL | Status: DC
  Administered 2024-03-30 – 2024-04-01 (×3): 81 mg via ORAL
  Filled 2024-03-29 (×2): qty 1

## 2024-03-29 MED ORDER — ENOXAPARIN SODIUM 40 MG/0.4ML IJ SOSY
40.0000 mg | PREFILLED_SYRINGE | INTRAMUSCULAR | Status: DC
  Administered 2024-03-29 – 2024-03-31 (×3): 40 mg via SUBCUTANEOUS
  Filled 2024-03-29 (×3): qty 0.4

## 2024-03-29 MED ORDER — BENZONATATE 100 MG PO CAPS
200.0000 mg | ORAL_CAPSULE | Freq: Once | ORAL | Status: AC
  Administered 2024-03-29: 200 mg via ORAL
  Filled 2024-03-29: qty 2

## 2024-03-29 MED ORDER — ONDANSETRON HCL 4 MG/2ML IJ SOLN: 4.0000 mg | Freq: Four times a day (QID) | INTRAMUSCULAR | Status: DC | PRN

## 2024-03-29 MED ORDER — ACETAMINOPHEN 325 MG PO TABS
650.0000 mg | ORAL_TABLET | Freq: Four times a day (QID) | ORAL | Status: DC | PRN
  Administered 2024-03-29: 650 mg via ORAL
  Filled 2024-03-29 (×2): qty 2

## 2024-03-29 MED ORDER — SODIUM CHLORIDE 0.9% FLUSH: 3.0000 mL | INTRAVENOUS | Status: DC | PRN

## 2024-03-29 MED ORDER — SODIUM CHLORIDE 0.9 % IV BOLUS
500.0000 mL | Freq: Once | INTRAVENOUS | Status: AC
  Administered 2024-03-29: 500 mL via INTRAVENOUS

## 2024-03-29 MED ORDER — DM-GUAIFENESIN ER 30-600 MG PO TB12
1.0000 | ORAL_TABLET | Freq: Two times a day (BID) | ORAL | Status: DC
  Administered 2024-03-29 – 2024-04-01 (×7): 1 via ORAL
  Filled 2024-03-29 (×5): qty 1

## 2024-03-29 MED ORDER — LORAZEPAM 0.5 MG PO TABS
0.5000 mg | ORAL_TABLET | Freq: Two times a day (BID) | ORAL | Status: DC | PRN
  Administered 2024-03-30 – 2024-03-31 (×3): 0.5 mg via ORAL
  Filled 2024-03-29 (×3): qty 1

## 2024-03-29 MED ORDER — POLYETHYLENE GLYCOL 3350 17 G PO PACK: 17.0000 g | PACK | Freq: Every day | ORAL | Status: DC | PRN

## 2024-03-29 MED ORDER — ESCITALOPRAM OXALATE 10 MG PO TABS
20.0000 mg | ORAL_TABLET | Freq: Every day | ORAL | Status: DC
  Administered 2024-03-29 – 2024-04-01 (×4): 20 mg via ORAL
  Filled 2024-03-29 (×3): qty 2

## 2024-03-29 MED ORDER — SODIUM CHLORIDE 0.9 % IV SOLN: INTRAVENOUS | Status: AC | PRN

## 2024-03-29 MED ORDER — BUDESON-GLYCOPYRROL-FORMOTEROL 160-9-4.8 MCG/ACT IN AERO
2.0000 | INHALATION_SPRAY | Freq: Two times a day (BID) | RESPIRATORY_TRACT | Status: DC
  Filled 2024-03-29: qty 5.9

## 2024-03-29 MED ORDER — METHYLPREDNISOLONE SODIUM SUCC 40 MG IJ SOLR
40.0000 mg | Freq: Two times a day (BID) | INTRAMUSCULAR | Status: DC
  Administered 2024-03-29 – 2024-04-01 (×7): 40 mg via INTRAVENOUS
  Filled 2024-03-29 (×6): qty 1

## 2024-03-29 MED ORDER — POTASSIUM CHLORIDE CRYS ER 20 MEQ PO TBCR
40.0000 meq | EXTENDED_RELEASE_TABLET | Freq: Once | ORAL | Status: AC
  Administered 2024-03-29: 40 meq via ORAL
  Filled 2024-03-29: qty 2

## 2024-03-29 MED ORDER — ONDANSETRON HCL 4 MG PO TABS: 4.0000 mg | ORAL_TABLET | Freq: Four times a day (QID) | ORAL | Status: DC | PRN

## 2024-03-29 MED ORDER — NICOTINE 21 MG/24HR TD PT24
21.0000 mg | MEDICATED_PATCH | Freq: Every day | TRANSDERMAL | Status: DC
  Filled 2024-03-29 (×2): qty 1

## 2024-03-29 MED ORDER — ROSUVASTATIN CALCIUM 10 MG PO TABS
10.0000 mg | ORAL_TABLET | Freq: Every day | ORAL | Status: DC
  Administered 2024-03-29 – 2024-04-01 (×4): 10 mg via ORAL
  Filled 2024-03-29 (×3): qty 1

## 2024-03-29 MED ORDER — SODIUM CHLORIDE 0.9% FLUSH
3.0000 mL | Freq: Two times a day (BID) | INTRAVENOUS | Status: DC
  Administered 2024-03-31: 3 mL via INTRAVENOUS

## 2024-03-29 MED ORDER — IPRATROPIUM-ALBUTEROL 0.5-2.5 (3) MG/3ML IN SOLN
3.0000 mL | Freq: Once | RESPIRATORY_TRACT | Status: AC
  Administered 2024-03-29: 3 mL via RESPIRATORY_TRACT
  Filled 2024-03-29: qty 3

## 2024-03-29 MED ORDER — IPRATROPIUM-ALBUTEROL 0.5-2.5 (3) MG/3ML IN SOLN
3.0000 mL | Freq: Four times a day (QID) | RESPIRATORY_TRACT | Status: DC
  Administered 2024-03-29 – 2024-03-30 (×6): 3 mL via RESPIRATORY_TRACT
  Filled 2024-03-29 (×6): qty 3

## 2024-03-29 MED ORDER — ACETAMINOPHEN 650 MG RE SUPP: 650.0000 mg | Freq: Four times a day (QID) | RECTAL | Status: DC | PRN

## 2024-03-29 MED ORDER — BISACODYL 10 MG RE SUPP: 10.0000 mg | Freq: Every day | RECTAL | Status: DC | PRN

## 2024-03-29 MED ORDER — LEVOTHYROXINE SODIUM 25 MCG PO TABS
25.0000 ug | ORAL_TABLET | Freq: Every day | ORAL | Status: DC
  Administered 2024-03-30 – 2024-04-01 (×3): 25 ug via ORAL
  Filled 2024-03-29 (×2): qty 1

## 2024-03-29 MED ORDER — OSELTAMIVIR PHOSPHATE 75 MG PO CAPS
75.0000 mg | ORAL_CAPSULE | Freq: Two times a day (BID) | ORAL | Status: AC
  Administered 2024-03-29 – 2024-03-31 (×4): 75 mg via ORAL
  Filled 2024-03-29 (×4): qty 1

## 2024-03-29 MED ORDER — SODIUM CHLORIDE 0.9% FLUSH
3.0000 mL | Freq: Two times a day (BID) | INTRAVENOUS | Status: DC
  Administered 2024-03-29 – 2024-04-01 (×7): 3 mL via INTRAVENOUS

## 2024-03-29 MED ORDER — LISINOPRIL 10 MG PO TABS
10.0000 mg | ORAL_TABLET | Freq: Every day | ORAL | Status: DC
  Administered 2024-03-29 – 2024-04-01 (×4): 10 mg via ORAL
  Filled 2024-03-29 (×3): qty 1

## 2024-03-29 MED ORDER — ALBUTEROL SULFATE (2.5 MG/3ML) 0.083% IN NEBU
10.0000 mg | INHALATION_SOLUTION | Freq: Once | RESPIRATORY_TRACT | Status: AC
  Administered 2024-03-29: 10 mg via RESPIRATORY_TRACT
  Filled 2024-03-29: qty 12

## 2024-03-29 NOTE — ED Provider Notes (Signed)
 " [place] EMERGENCY DEPARTMENT AT [facility] Provider Note   CSN: [id number] Arrival date & time: 03/29/24  9157     Patient presents with: Shortness of Breath   Amanda York is a 58 y.o. female with a history including hypertension, emphysema, hypothyroidism who was diagnosed here 5 days ago with influenza presenting for evaluation of increased wheezing and shortness of breath, she also describes generalized bodyaches consistent with her flu diagnosis.  This morning she tried to use a breathing treatment but was not helpful, stating she was unable to take an inhalation effective enough to get the medicine into her lungs.  EMS was called and she received a breathing treatment and route, it was noted that her initial oxygen saturation was 79% on room air prior to arrival.  She is not on home oxygen at baseline.   She is completed a course of Tamiflu  and is also on prednisone  50 mg daily since her flu diagnosis.  She denies nausea or vomiting, chest pain, abdominal pain, peripheral edema, unilateral leg swelling.  No orthopnea.   The history is provided by the patient.       Prior to Admission medications  Medication Sig Start Date End Date Taking? Authorizing Provider  albuterol  (VENTOLIN  HFA) 108 (90 Base) MCG/ACT inhaler Inhale 2 puffs into the lungs every 6 (six) hours as needed for wheezing or shortness of breath. 02/27/24   [name], DO  aspirin  EC 81 MG EC tablet Take 1 tablet (81 mg total) by mouth daily. 10/20/14   Dhungel, Barnetta, MD  cetirizine  (ZYRTEC ) 10 MG tablet Take 1 tablet (10 mg total) by mouth daily. 09/14/20   [name], DO  escitalopram  (LEXAPRO ) 20 MG tablet Take 1 tablet (20 mg total) by mouth daily. 02/27/24   [name], DO  Fluticasone -Umeclidin-Vilant (TRELEGY ELLIPTA ) 200-62.5-25 MCG/ACT AEPB Inhale 1 puff into the lungs daily. **DOSE CHANGE 02/27/24   Jolinda Norene M, DO  ipratropium-albuterol  (DUONEB) 0.5-2.5 (3)  MG/3ML SOLN Take 3 mLs by nebulization every 6 (six) hours as needed. 03/24/24   Francesca Elsie CROME, MD  levothyroxine  (SYNTHROID ) 25 MCG tablet Take 1 tablet (25 mcg total) by mouth daily before breakfast. 02/27/24   Jolinda Norene M, DO  lisinopril  (ZESTRIL ) 10 MG tablet Take 1 tablet (10 mg total) by mouth daily. 02/27/24   [name], DO  LORazepam  (ATIVAN ) 0.5 MG tablet Take 1 tablet 1 hour before scheduled scan. May repeat dose 1 time before scan if needed 02/27/24   Jolinda Norene M, DO  oseltamivir  (TAMIFLU ) 75 MG capsule Take 1 capsule (75 mg total) by mouth every 12 (twelve) hours. 03/24/24   Francesca Elsie CROME, MD  predniSONE  (DELTASONE ) 50 MG tablet Take 1 tablet (50 mg total) by mouth daily with breakfast for 5 days. 03/25/24 03/30/24  Francesca Elsie CROME, MD  rosuvastatin  (CRESTOR ) 10 MG tablet Take 1 tablet (10 mg total) by mouth daily. 03/05/24   [name], DO  Vitamin D , Ergocalciferol , (DRISDOL ) 1.25 MG (50000 UNIT) CAPS capsule Take 1 capsule (50,000 Units total) by mouth every 7 (seven) days. 02/27/24   [name], DO    Allergies: Alendronate     Review of Systems  Constitutional:  Positive for fatigue. Negative for chills and fever.  HENT:  Negative for congestion, ear pain, rhinorrhea, sinus pressure, sore throat, trouble swallowing and voice change.   Eyes:  Negative for discharge.  Respiratory:  Positive for cough, shortness of breath and  wheezing. Negative for stridor.   Cardiovascular:  Negative for chest pain.  Gastrointestinal:  Negative for abdominal pain, nausea and vomiting.  Genitourinary: Negative.   Musculoskeletal:  Positive for myalgias.    Updated Vital Signs BP (!) 151/86   Pulse 85   Temp 98.2 F (36.8 C) (Oral)   Resp (!) 24   Ht 5' 1 (1.549 m)   Wt 63.5 kg   SpO2 97%   BMI 26.45 kg/m   Physical Exam Vitals and nursing note reviewed.  Constitutional:      Appearance: She is well-developed.  HENT:      Head: Normocephalic and atraumatic.     Mouth/Throat:     Mouth: Mucous membranes are dry.  Eyes:     Conjunctiva/sclera: Conjunctivae normal.  Cardiovascular:     Rate and Rhythm: Normal rate and regular rhythm.     Heart sounds: Normal heart sounds.  Pulmonary:     Effort: Pulmonary effort is normal.     Breath sounds: Decreased breath sounds and wheezing present.     Comments: Wheezing throughout all lung fields with prolonged expirations. Abdominal:     General: Bowel sounds are normal.     Palpations: Abdomen is soft.     Tenderness: There is no abdominal tenderness.  Musculoskeletal:        General: Normal range of motion.     Cervical back: Normal range of motion.     Right lower leg: No tenderness. No edema.     Left lower leg: No tenderness. No edema.  Skin:    General: Skin is warm and dry.  Neurological:     Mental Status: She is alert.     (all labs ordered are listed, but only abnormal results are displayed) Labs Reviewed  BASIC METABOLIC PANEL WITH GFR - Abnormal; Notable for the following components:      Result Value   Potassium 3.2 (*)    CO2 38 (*)    BUN <5 (*)    Anion gap 3 (*)    All other components within normal limits  CBC WITH DIFFERENTIAL/PLATELET - Abnormal; Notable for the following components:   RBC 5.15 (*)    Hemoglobin 15.2 (*)    HCT 46.4 (*)    Abs Immature Granulocytes 0.10 (*)    All other components within normal limits    EKG: EKG Interpretation Date/Time:  Saturday March 29 2024 09:28:07 EST Ventricular Rate:  92 PR Interval:  145 QRS Duration:  71 QT Interval:  343 QTC Calculation: 425 R Axis:   82  Text Interpretation: Sinus rhythm Consider left atrial enlargement Minimal ST elevation, inferior leads No significant change since last tracing Confirmed by Ellouise Fine ([id number]) on 03/29/2024 9:44:28 AM  Radiology: [facility] Chest Port 1 View Result Date: 03/29/2024 CLINICAL DATA:  Shortness of breath.  Influenza  infection. EXAM: PORTABLE CHEST 1 VIEW COMPARISON:  03/24/2024 FINDINGS: Rotated film. The cardiopericardial silhouette is within normal limits for size. Interstitial markings are diffusely coarsened with chronic features. Hyperexpansion suggests emphysema. No acute bony abnormality. IMPRESSION: Chronic interstitial coarsening without acute cardiopulmonary findings. Electronically Signed   By: Camellia Candle M.D.   On: 03/29/2024 10:28     Procedures   Medications Ordered in the ED  albuterol  (PROVENTIL ) (2.5 MG/3ML) 0.083% nebulizer solution 10 mg (has no administration in time range)  ipratropium-albuterol  (DUONEB) 0.5-2.5 (3) MG/3ML nebulizer solution 3 mL (3 mLs Nebulization Given 03/29/24 0937)  benzonatate  (TESSALON ) capsule 200 mg (200 mg Oral  Given 03/29/24 0934)  sodium chloride  0.9 % bolus 500 mL (500 mLs Intravenous Bolus from Bag 03/29/24 0936)  potassium chloride  SA (KLOR-CON  M) CR tablet 40 mEq (40 mEq Oral Given 03/29/24 1059)                                    Medical Decision Making Patient with a history of COPD was diagnosed earlier this week with influenza and is completing a course of Tamiflu  but presents secondary to worsening wheezing and shortness of breath despite home breathing treatments, continues to desaturate despite additional breathing treatment here including albuterol  and Atrovent.  She received albuterol  and route as well.  After her DuoNeb treatment, she still had significant wheezing although improving.  Attempt at taking patient off of oxygen was unsuccessful, at rest she dropped to 86% on room air, therefore O2 was reinstated.  She was also ordered an albuterol  hour-long 10 mg neb.  Patient will benefit from admission.  Amount and/or Complexity of Data Reviewed Labs: ordered.    Details: Labs prior to today were reviewed including her respiratory panel from 12/22 positive for influenza A.  CBC and B med obtained today are reassuring, she has a normal WC BC  count of 6.1.  Her potassium is 3.2, she was given oral supplementation. Radiology: ordered.    Details: Chest x-ray reviewed, negative for pneumonia. ECG/medicine tests: ordered.    Details: Rate 92, sinus rhythm, no significant changes compared to prior EKG Discussion of management or test interpretation with external provider(s): Call placed to hospitalist for admission.  Dr. Pearlean accepting pt for admission  Risk Decision regarding hospitalization.        Final diagnoses:  Influenza A with respiratory manifestations  Hypoxia    ED Discharge Orders     None          Uriel Horkey, PA-C 03/29/24 1218    Kingsley, Victoria K, DO 03/29/24 1457  "

## 2024-03-29 NOTE — ED Triage Notes (Signed)
 Pt arrived via EMS; influenza dx Monday; 79% rm air, 99% on 3L; hx of COPD; EMS administered duoneb + solumedrol (125 mg) en route; pt states she used inhaler before EMS arrival as well. Pt states symptoms getting worse since Monday. A&Ox4

## 2024-03-29 NOTE — Progress Notes (Signed)
" °   03/29/24 1357  Assess: MEWS Score  Temp 97.8 F (36.6 C)  BP (!) 140/86  MAP (mmHg) 103  Pulse Rate (!) 103  Resp (!) 26  Level of Consciousness Alert  SpO2 92 %  O2 Device Nasal Cannula  O2 Flow Rate (L/min) 3 L/min  Assess: MEWS Score  MEWS Temp 0  MEWS Systolic 0  MEWS Pulse 1  MEWS RR 2  MEWS LOC 0  MEWS Score 3  MEWS Score Color Yellow  Assess: if the MEWS score is Yellow or Red  Were vital signs accurate and taken at a resting state? Yes  Does the patient meet 2 or more of the SIRS criteria? No  MEWS guidelines implemented  Yes, yellow  Treat  MEWS Interventions Considered administering scheduled or prn medications/treatments as ordered  Take Vital Signs  Increase Vital Sign Frequency  Yellow: Q2hr x1, continue Q4hrs until patient remains green for 12hrs  Escalate  MEWS: Escalate Yellow: Discuss with charge nurse and consider notifying provider and/or RRT  Notify: Charge Nurse/RN  Name of Charge Nurse/RN Notified Connell Aida PEAK  Provider Notification  Provider Name/Title Dr. Parker  Date Provider Notified 03/29/24  Method of Notification Page  Notification Reason Other (Comment) (patient yellow mews)  Provider response No new orders  Assess: SIRS CRITERIA  SIRS Temperature  0  SIRS Respirations  1  SIRS Pulse 1  SIRS WBC 0  SIRS Score Sum  2    "

## 2024-03-29 NOTE — Plan of Care (Signed)

## 2024-03-29 NOTE — H&P (Addendum)
 " History and Physical    Patient: Amanda York FMW:[id number] DOB: 1966-01-16 DOA: 03/29/2024 DOS: the patient was seen and examined on 03/29/2024 PCP: [name], DO  Patient coming from: Home  Chief Complaint:  Chief Complaint  Patient presents with   Shortness of Breath   HPI: Amanda York is a 57 y.o. female with medical history significant for ongoing tobacco abuse, COPD, HTN, hypothyroidism, HLD as well as depression and anxiety who was diagnosed with influenza A on 03/24/2024 and  started on prednisone  and Tamiflu  (she missed a couple doses of her Tamiflu  since being seen in the ED on 03/24/2024) presents to the ED again today with worsening shortness of breath, cough, and hypoxia -- Additional history obtained from patient's daughter Amanda York at bedside - Per EMS O2 sats was down to 79% on room air patient currently on 3 L after interventions including IV Solu-Medrol  and bronchodilators - Patient endorses myalgias, fatigue and malaise--wheezing improved in the ED after bronchodilators , but hypoxia persist -- Patient's 30 year old nephew also has influenza -- Patient did Not get the flu vaccine No Nausea, Vomiting or Diarrhea - Patient reports poor appetite and poor oral intake - No frank chest pains  Chest x-ray on 03/29/2024 and prior chest x-ray from 03/24/2024 without acute findings patient does have chronic findings consistent with emphysema On 03/24/2024 during previous ED visit patient was positive for influenza A negative for RSV negative for COVID -- VBG without elevated pCO2 - WBC 6.1 hemoglobin is 15.2 platelets 262 - Sodium is 142, potassium is low at 3.2 chloride 100 bicarb 38 creatinine 0.63, anion gap 3, bicarb is 38  Review of Systems: As mentioned in the history of present illness. All other systems reviewed and are negative. Past Medical History:  Diagnosis Date   Arthritis    Atypical chest pain 10/20/2014   Chest pain 10/19/2014   COPD  (chronic obstructive pulmonary disease) (HCC)    Hypertension    Hypothyroidism    Tobacco abuse    Past Surgical History:  Procedure Laterality Date   none     Social History:  reports that she has been smoking cigarettes. She has a 41 pack-year smoking history. She has been exposed to tobacco smoke. She has never used smokeless tobacco. She reports that she does not drink alcohol and does not use drugs.  Allergies[1]  Family History  Problem Relation Age of Onset   CAD Father        Died at 28 of MI   Coronary artery disease Father    COPD Sister    CAD Paternal Uncle        Died at 58 of MI   Breast cancer Maternal Grandmother    CAD Paternal Grandfather        Details unclaer    Prior to Admission medications  Medication Sig Start Date End Date Taking? Authorizing Provider  albuterol  (VENTOLIN  HFA) 108 (90 Base) MCG/ACT inhaler Inhale 2 puffs into the lungs every 6 (six) hours as needed for wheezing or shortness of breath. 02/27/24  Yes [name], DO  aspirin  EC 81 MG EC tablet Take 1 tablet (81 mg total) by mouth daily. 10/20/14  Yes Dhungel, Nishant, MD  cetirizine  (ZYRTEC ) 10 MG tablet Take 1 tablet (10 mg total) by mouth daily. 09/14/20  Yes Gottschalk, Norene M, DO  escitalopram  (LEXAPRO ) 20 MG tablet Take 1 tablet (20 mg total) by mouth daily. 02/27/24  Yes [name], DO  Fluticasone -Umeclidin-Vilant (TRELEGY ELLIPTA ) 200-62.5-25 MCG/ACT AEPB Inhale 1 puff into the lungs daily. **DOSE CHANGE 02/27/24  Yes Jolinda Potter M, DO  ipratropium-albuterol  (DUONEB) 0.5-2.5 (3) MG/3ML SOLN Take 3 mLs by nebulization every 6 (six) hours as needed. 03/24/24  Yes Francesca Elsie CROME, MD  levothyroxine  (SYNTHROID ) 25 MCG tablet Take 1 tablet (25 mcg total) by mouth daily before breakfast. 02/27/24  Yes Gottschalk, Ashly M, DO  lisinopril  (ZESTRIL ) 10 MG tablet Take 1 tablet (10 mg total) by mouth daily. 02/27/24  Yes Jolinda Potter M, DO  oseltamivir  (TAMIFLU )  75 MG capsule Take 1 capsule (75 mg total) by mouth every 12 (twelve) hours. 03/24/24  Yes Francesca Elsie CROME, MD  predniSONE  (DELTASONE ) 50 MG tablet Take 1 tablet (50 mg total) by mouth daily with breakfast for 5 days. 03/25/24 03/30/24 Yes Francesca Elsie CROME, MD  rosuvastatin  (CRESTOR ) 10 MG tablet Take 1 tablet (10 mg total) by mouth daily. 03/05/24  Yes Gottschalk, Potter M, DO  Vitamin D , Ergocalciferol , (DRISDOL ) 1.25 MG (50000 UNIT) CAPS capsule Take 1 capsule (50,000 Units total) by mouth every 7 (seven) days. 02/27/24  Yes Jolinda Potter M, DO  LORazepam  (ATIVAN ) 0.5 MG tablet Take 1 tablet 1 hour before scheduled scan. May repeat dose 1 time before scan if needed Patient not taking: Reported on 03/29/2024 02/27/24   Jolinda Potter HERO, DO    Physical Exam: Vitals:   03/29/24 9062 03/29/24 1030 03/29/24 1223 03/29/24 1357  BP:  (!) 151/86  (!) 140/86  Pulse:  85  (!) 103  Resp:  (!) 24  (!) 26  Temp:    97.8 F (36.6 C)  TempSrc:    Oral  SpO2: 97% 97% 94% 92%  Weight:      Height:        Physical Exam Gen:- Awake Alert, conversational dyspnea HEENT:- [place].AT, No sclera icterus Nose- [place] 3L/min Neck-Supple Neck,No JVD,.  Lungs-diminished breath sounds, few scattered wheezes bilaterally CV- S1, S2 normal, RRR, tachycardic Abd-  +ve B.Sounds, Abd Soft, No tenderness,    Extremity/Skin:- No  edema,   good pedal pulses  Psych-affect is flat,, oriented x3 Neuro-generalized weakness, no new focal deficits, no tremors  Data Reviewed: Chest x-ray on 03/29/2024 and prior chest x-ray from 03/24/2024 without acute findings patient does have chronic findings consistent with emphysema On 03/24/2024 during previous ED visit patient was positive for influenza A negative for RSV negative for COVID -- VBG without elevated pCO2 - WBC 6.1 hemoglobin is 15.2 platelets 262 - Sodium is 142, potassium is low at 3.2 chloride 100 bicarb 38 creatinine 0.63, anion gap 3, bicarb is  38  Assessment and Plan: 1)Influenza A infection--diagnosed on 03/24/2024 --- Patient did Not get the flu vaccine - Respiratory symptoms have worsened since then --No leukocytosis - Patient missed doses Tamiflu  - Okay to complete Tamiflu   - Admit Solu-Medrol , bronchodilators and mucolytics as ordered - Chest x-ray from 03/25/2019 , and repeat chest x-ray from from 03/29/2024 without superimposed pneumonia -Given incentive spirometry -- 2) acute COPD exacerbation--- due to #1 above- =-Management as above #1  3)Tobacco abuse--smoking cessation advised - Okay to give nicotine  patch  4)Acute hypoxic respiratory failure--- due to #1 #2 above - VBG without evidence of CO2 retention - Currently requiring 2 L of oxygen via nasal cannula - Management as above #1  5)Depression/anxiety --- continue Lexapro , may use lorazepam  as needed  6)HLD--continue Crestor   7) hypothyroidism--- continue levothyroxine   8) mild hypokalemia--- replace potassium especially in the setting  of bronchodilator use --   Advance Care Planning:   Code Status: Full Code   Family Communication: Daughter Amanda York at bedside  Severity of Illness: The appropriate patient status for this patient is OBSERVATION. Observation status is judged to be reasonable and necessary in order to provide the required intensity of service to ensure the patient's safety. The patient's presenting symptoms, physical exam findings, and initial radiographic and laboratory data in the context of their medical condition is felt to place them at decreased risk for further clinical deterioration. Furthermore, it is anticipated that the patient will be medically stable for discharge from the hospital within 2 midnights of admission.   Author: Rendall Carwin, MD 03/29/2024 2:47 PM  For on call review [email or web address].      [1]  Allergies Allergen Reactions   Alendronate      Reported internal allergy. ?cause but this was the only thing  started around the time she was evaluated in ER.   "

## 2024-03-30 DIAGNOSIS — F419 Anxiety disorder, unspecified: Secondary | ICD-10-CM | POA: Diagnosis present

## 2024-03-30 DIAGNOSIS — E039 Hypothyroidism, unspecified: Secondary | ICD-10-CM | POA: Diagnosis present

## 2024-03-30 DIAGNOSIS — F32A Depression, unspecified: Secondary | ICD-10-CM | POA: Diagnosis present

## 2024-03-30 DIAGNOSIS — Z716 Tobacco abuse counseling: Secondary | ICD-10-CM | POA: Diagnosis not present

## 2024-03-30 DIAGNOSIS — E785 Hyperlipidemia, unspecified: Secondary | ICD-10-CM | POA: Diagnosis present

## 2024-03-30 DIAGNOSIS — E876 Hypokalemia: Secondary | ICD-10-CM | POA: Diagnosis present

## 2024-03-30 DIAGNOSIS — Z79890 Hormone replacement therapy (postmenopausal): Secondary | ICD-10-CM | POA: Diagnosis not present

## 2024-03-30 DIAGNOSIS — R5381 Other malaise: Secondary | ICD-10-CM | POA: Diagnosis present

## 2024-03-30 DIAGNOSIS — Z888 Allergy status to other drugs, medicaments and biological substances status: Secondary | ICD-10-CM | POA: Diagnosis not present

## 2024-03-30 DIAGNOSIS — Z8249 Family history of ischemic heart disease and other diseases of the circulatory system: Secondary | ICD-10-CM | POA: Diagnosis not present

## 2024-03-30 DIAGNOSIS — F17210 Nicotine dependence, cigarettes, uncomplicated: Secondary | ICD-10-CM | POA: Diagnosis present

## 2024-03-30 DIAGNOSIS — J101 Influenza due to other identified influenza virus with other respiratory manifestations: Secondary | ICD-10-CM | POA: Diagnosis present

## 2024-03-30 DIAGNOSIS — Z803 Family history of malignant neoplasm of breast: Secondary | ICD-10-CM | POA: Diagnosis not present

## 2024-03-30 DIAGNOSIS — Z825 Family history of asthma and other chronic lower respiratory diseases: Secondary | ICD-10-CM | POA: Diagnosis not present

## 2024-03-30 DIAGNOSIS — Z7982 Long term (current) use of aspirin: Secondary | ICD-10-CM | POA: Diagnosis not present

## 2024-03-30 DIAGNOSIS — J9601 Acute respiratory failure with hypoxia: Secondary | ICD-10-CM | POA: Diagnosis present

## 2024-03-30 DIAGNOSIS — J441 Chronic obstructive pulmonary disease with (acute) exacerbation: Secondary | ICD-10-CM

## 2024-03-30 DIAGNOSIS — Z79899 Other long term (current) drug therapy: Secondary | ICD-10-CM | POA: Diagnosis not present

## 2024-03-30 DIAGNOSIS — Z91148 Patient's other noncompliance with medication regimen for other reason: Secondary | ICD-10-CM | POA: Diagnosis not present

## 2024-03-30 DIAGNOSIS — I10 Essential (primary) hypertension: Secondary | ICD-10-CM | POA: Diagnosis present

## 2024-03-30 DIAGNOSIS — Z1152 Encounter for screening for COVID-19: Secondary | ICD-10-CM | POA: Diagnosis not present

## 2024-03-30 LAB — HIV ANTIBODY (ROUTINE TESTING W REFLEX): HIV Screen 4th Generation wRfx: NONREACTIVE

## 2024-03-30 LAB — GLUCOSE, CAPILLARY
Glucose-Capillary: 122 mg/dL — ABNORMAL HIGH (ref 70–99)
Glucose-Capillary: 172 mg/dL — ABNORMAL HIGH (ref 70–99)
Glucose-Capillary: 180 mg/dL — ABNORMAL HIGH (ref 70–99)
Glucose-Capillary: 92 mg/dL (ref 70–99)

## 2024-03-30 MED ORDER — POTASSIUM CHLORIDE CRYS ER 20 MEQ PO TBCR
40.0000 meq | EXTENDED_RELEASE_TABLET | Freq: Once | ORAL | Status: AC
  Administered 2024-03-30: 40 meq via ORAL
  Filled 2024-03-30: qty 2

## 2024-03-30 NOTE — Plan of Care (Signed)

## 2024-03-30 NOTE — Progress Notes (Signed)
 " PROGRESS NOTE  Amanda York, is a 58 y.o. female, DOB - 1966/03/11, FMW:[id number]  Admit date - 03/29/2024   Admitting Physician Amanda Hayduk Pearlean, MD  Outpatient Primary MD for the patient is Amanda Norene HERO, DO  LOS - 0  Chief Complaint  Patient presents with   Shortness of Breath      Brief Narrative:  58 y.o. female with medical history significant for ongoing tobacco abuse, COPD, HTN, hypothyroidism, HLD as well as depression and anxiety who was diagnosed with influenza A on 03/24/2024 and  started on prednisone  and Tamiflu  (she missed a couple doses of her Tamiflu  since being seen in the ED on 03/24/2024) admitted to the hospital on 03/29/2024 with worsening shortness of breath, cough, and new onset hypoxia with 3 L of oxygen via nasal cannula requirement    -Assessment and Plan: 1)Influenza A infection--diagnosed on 03/24/2024 --- Patient did Not get the flu vaccine - Respiratory symptoms have worsened since recent ED visit --No leukocytosis - Patient missed doses of Tamiflu  - Okay to complete Tamiflu  this admission --c/n  Solu-Medrol , bronchodilators and mucolytics as ordered - Chest x-ray from 03/24/2024 , and repeat chest x-ray from from 03/29/2024 without superimposed pneumonia -As of 03/30/2024 cough, dyspnea and overall respiratory status improving slowly - Continue incentive spirometry -- 2) acute COPD exacerbation--- due to #1 above- =-Management as above #1   3)Tobacco abuse--smoking cessation advised - Nicotine  patch as prescribed   4)Acute hypoxic respiratory failure--- due to #1 #2 above - VBG without evidence of CO2 retention - Currently requiring 2 L of oxygen via nasal cannula - Management as above #1   5)Depression/anxiety --- continue Lexapro , may use lorazepam  as needed   6)HLD--continue Crestor    7) hypothyroidism--- continue levothyroxine    8) mild hypokalemia--- replace potassium especially in the setting of bronchodilator use --   Status is: Inpatient   Disposition: The patient is from: Home              Anticipated d/c is to: Home              Anticipated d/c date is: 1 day              Patient currently is not medically stable to d/c. Barriers: Not Clinically Stable-   Code Status :  -  Code Status: Full Code   Family Communication:    NA (patient is alert, awake and coherent)   Discussed with daughter Amanda York at bedside  DVT Prophylaxis  :   - SCDs   enoxaparin  (LOVENOX ) injection 40 mg Start: 03/29/24 2200 SCDs Start: 03/29/24 1333 Place TED hose Start: 03/29/24 1333   Lab Results  Component Value Date   PLT 262 03/29/2024    Inpatient Medications  Scheduled Meds:  aspirin  EC  81 mg Oral Q breakfast   budesonide -glycopyrrolate -formoterol   2 puff Inhalation BID   dextromethorphan -guaiFENesin   1 tablet Oral BID   enoxaparin  (LOVENOX ) injection  40 mg Subcutaneous Q24H   escitalopram   20 mg Oral Daily   ipratropium-albuterol   3 mL Nebulization QID   levothyroxine   25 mcg Oral QAC breakfast   lisinopril   10 mg Oral Daily   methylPREDNISolone  (SOLU-MEDROL ) injection  40 mg Intravenous BID   nicotine   21 mg Transdermal Daily   oseltamivir   75 mg Oral Q12H   rosuvastatin   10 mg Oral Daily   sodium chloride  flush  3 mL Intravenous Q12H   sodium chloride  flush  3 mL Intravenous Q12H   Continuous Infusions:  PRN Meds:.acetaminophen  **OR** acetaminophen , albuterol , bisacodyl , LORazepam , ondansetron  **OR** ondansetron  (ZOFRAN ) IV, polyethylene glycol, sodium chloride  flush   Anti-infectives (From admission, onward)    Start     Dose/Rate Route Frequency Ordered Stop   03/29/24 1400  oseltamivir  (TAMIFLU ) capsule 75 mg        75 mg Oral Every 12 hours 03/29/24 1334 03/31/24 1359         Subjective: Amanda York today has no fevers, no emesis,  No chest pain,   -- Cough and dyspnea improving - Currently requiring 2 L of oxygen via nasal cannula - Daughter Amanda York at bedside, questions  answered   Objective: Vitals:   03/30/24 0627 03/30/24 1256 03/30/24 1419 03/30/24 1617  BP:   129/79   Pulse:   (!) 102   Resp:      Temp:   98.4 F (36.9 C)   TempSrc:   Oral   SpO2: 98% 94% 90% 94%  Weight:      Height:        Intake/Output Summary (Last 24 hours) at 03/30/2024 1659 Last data filed at 03/30/2024 0932 Gross per 24 hour  Intake 240 ml  Output --  Net 240 ml   Filed Weights   03/29/24 0857  Weight: 63.5 kg    Physical Exam  Gen:- Awake Alert, no conversational dyspnea but patient with dyspnea on exertion HEENT:- [place].AT, No sclera icterus Nose--[place] 2L/min Neck-Supple Neck,No JVD,.  Lungs-air movement remains diminished, few scattered wheezes  CV- S1, S2 normal, regular  Abd-  +ve B.Sounds, Abd Soft, No tenderness,    Extremity/Skin:- No  edema, pedal pulses present  Psych-affect is appropriate, oriented x3 Neuro-no new focal deficits, no tremors  Data Reviewed: I have personally reviewed following labs and imaging studies  CBC: Recent Labs  Lab 03/24/24 1757 03/29/24 1013  WBC 5.6 6.1  NEUTROABS 4.3 3.8  HGB 16.1* 15.2*  HCT 47.5* 46.4*  MCV 88.0 90.1  PLT 208 262   Basic Metabolic Panel: Recent Labs  Lab 03/24/24 1757 03/29/24 0931  NA 132* 142  K 3.8 3.2*  CL 93* 100  CO2 25 38*  GLUCOSE 90 93  BUN 7 <5*  CREATININE 0.74 0.63  CALCIUM  9.1 9.0   GFR: Estimated Creatinine Clearance: 64.7 mL/min (by C-G formula based on SCr of 0.63 mg/dL).  Recent Results (from the past 240 hours)  Resp panel by RT-PCR (RSV, Flu A&B, Covid) Anterior Nasal Swab     Status: Abnormal   Collection Time: 03/24/24  5:37 PM   Specimen: Anterior Nasal Swab  Result Value Ref Range Status   SARS Coronavirus 2 by RT PCR NEGATIVE NEGATIVE Final    Comment: (NOTE) SARS-CoV-2 target nucleic acids are NOT DETECTED.  The SARS-CoV-2 RNA is generally detectable in upper respiratory specimens during the acute phase of infection. The lowest concentration of  SARS-CoV-2 viral copies this assay can detect is 138 copies/mL. A negative result does not preclude SARS-Cov-2 infection and should not be used as the sole basis for treatment or other patient management decisions. A negative result may occur with  improper specimen collection/handling, submission of specimen other than nasopharyngeal swab, presence of viral mutation(s) within the areas targeted by this assay, and inadequate number of viral copies(<138 copies/mL). A negative result must be combined with clinical observations, patient history, and epidemiological information. The expected result is Negative.  Fact Sheet for Patients:  bloggercourse.com  Fact Sheet for Healthcare Providers:  seriousbroker.it  This test is no t  yet approved or cleared by the [place]  FDA and  has been authorized for detection and/or diagnosis of SARS-CoV-2 by FDA under an Emergency Use Authorization (EUA). This EUA will remain  in effect (meaning this test can be used) for the duration of the COVID-19 declaration under Section 564(b)(1) of the Act, 21 U.S.C.section 360bbb-3(b)(1), unless the authorization is terminated  or revoked sooner.       Influenza A by PCR POSITIVE (A) NEGATIVE Final   Influenza B by PCR NEGATIVE NEGATIVE Final    Comment: (NOTE) The Xpert Xpress SARS-CoV-2/FLU/RSV plus assay is intended as an aid in the diagnosis of influenza from Nasopharyngeal swab specimens and should not be used as a sole basis for treatment. Nasal washings and aspirates are unacceptable for Xpert Xpress SARS-CoV-2/FLU/RSV testing.  Fact Sheet for Patients: bloggercourse.com  Fact Sheet for Healthcare Providers: seriousbroker.it  This test is not yet approved or cleared by the [place]  FDA and has been authorized for detection and/or diagnosis of SARS-CoV-2 by FDA under an Emergency Use  Authorization (EUA). This EUA will remain in effect (meaning this test can be used) for the duration of the COVID-19 declaration under Section 564(b)(1) of the Act, 21 U.S.C. section 360bbb-3(b)(1), unless the authorization is terminated or revoked.     Resp Syncytial Virus by PCR NEGATIVE NEGATIVE Final    Comment: (NOTE) Fact Sheet for Patients: bloggercourse.com  Fact Sheet for Healthcare Providers: seriousbroker.it  This test is not yet approved or cleared by the [place]  FDA and has been authorized for detection and/or diagnosis of SARS-CoV-2 by FDA under an Emergency Use Authorization (EUA). This EUA will remain in effect (meaning this test can be used) for the duration of the COVID-19 declaration under Section 564(b)(1) of the Act, 21 U.S.C. section 360bbb-3(b)(1), unless the authorization is terminated or revoked.  Performed at [facility], [address] [place], [place] [address]     Radiology Studies: [facility] Chest [facility] 1 View Result Date: 03/29/2024 CLINICAL DATA:  Shortness of breath.  Influenza infection. EXAM: PORTABLE CHEST 1 VIEW COMPARISON:  03/24/2024 FINDINGS: Rotated film. The cardiopericardial silhouette is within normal limits for size. Interstitial markings are diffusely coarsened with chronic features. Hyperexpansion suggests emphysema. No acute bony abnormality. IMPRESSION: Chronic interstitial coarsening without acute cardiopulmonary findings. Electronically Signed   By: Camellia Candle M.D.   On: 03/29/2024 10:28   Scheduled Meds:  aspirin  EC  81 mg Oral Q breakfast   budesonide -glycopyrrolate -formoterol   2 puff Inhalation BID   dextromethorphan -guaiFENesin   1 tablet Oral BID   enoxaparin  (LOVENOX ) injection  40 mg Subcutaneous Q24H   escitalopram   20 mg Oral Daily   ipratropium-albuterol   3 mL Nebulization QID   levothyroxine   25 mcg Oral QAC breakfast   lisinopril   10 mg Oral Daily    methylPREDNISolone  (SOLU-MEDROL ) injection  40 mg Intravenous BID   nicotine   21 mg Transdermal Daily   oseltamivir   75 mg Oral Q12H   rosuvastatin   10 mg Oral Daily   sodium chloride  flush  3 mL Intravenous Q12H   sodium chloride  flush  3 mL Intravenous Q12H   Continuous Infusions:   LOS: 0 days   Rendall Carwin M.D on 03/30/2024 at 4:59 PM  Go to [email or web address] - for contact info  [facility] - Office  [phone]  If 7PM-7AM, please contact night-coverage [email or web address] 03/30/2024, 4:59 PM    "

## 2024-03-30 NOTE — TOC CM/SW Note (Signed)
 Transition of Care [facility]) - Inpatient Brief Assessment   Patient Details  Name: Amanda York MRN: [id number] Date of Birth: 05/08/1965  Transition of Care [facility]) CM/SW Contact:    Lucie Lunger, LCSWA Phone Number: 03/30/2024, 8:57 AM   Clinical Narrative: Transition of Care Department [facility]) has reviewed patient and no TOC needs have been identified at this time. We will continue to monitor patient advancement through interdiciplinary progression rounds. If new patient transition needs arise, please place a TOC consult.  Transition of Care Asessment: Insurance and Status: Insurance coverage has been reviewed Patient has primary care physician: Yes Home environment has been reviewed: From home Prior level of function:: Independently Prior/Current Home Services: No current home services Social Drivers of Health Review: SDOH reviewed no interventions necessary   Transition of care needs: no transition of care needs at this time

## 2024-03-30 NOTE — Progress Notes (Signed)
 This morning O2 sat was in mid 80's on room air at rest.  Placed back on .  Has been ambulating independently to bathroom and in room.  After receiving neb treatment c/o feeling  panicky and received prn ativan .  Daughter has been at bedside all day and has brought in outside food.

## 2024-03-31 DIAGNOSIS — J441 Chronic obstructive pulmonary disease with (acute) exacerbation: Secondary | ICD-10-CM | POA: Diagnosis not present

## 2024-03-31 LAB — GLUCOSE, CAPILLARY
Glucose-Capillary: 106 mg/dL — ABNORMAL HIGH (ref 70–99)
Glucose-Capillary: 129 mg/dL — ABNORMAL HIGH (ref 70–99)
Glucose-Capillary: 160 mg/dL — ABNORMAL HIGH (ref 70–99)
Glucose-Capillary: 181 mg/dL — ABNORMAL HIGH (ref 70–99)

## 2024-03-31 LAB — RENAL FUNCTION PANEL
Albumin: 3.9 g/dL (ref 3.5–5.0)
Anion gap: 6 (ref 5–15)
BUN: 8 mg/dL (ref 6–20)
CO2: 33 mmol/L — ABNORMAL HIGH (ref 22–32)
Calcium: 9.2 mg/dL (ref 8.9–10.3)
Chloride: 99 mmol/L (ref 98–111)
Creatinine, Ser: 0.51 mg/dL (ref 0.44–1.00)
GFR, Estimated: >60 mL/min
Glucose, Bld: 120 mg/dL — ABNORMAL HIGH (ref 70–99)
Phosphorus: 3.6 mg/dL (ref 2.5–4.6)
Potassium: 4.5 mmol/L (ref 3.5–5.1)
Sodium: 137 mmol/L (ref 135–145)

## 2024-03-31 MED ORDER — AZITHROMYCIN 250 MG PO TABS
500.0000 mg | ORAL_TABLET | Freq: Every day | ORAL | Status: DC
  Administered 2024-03-31 – 2024-04-01 (×2): 500 mg via ORAL
  Filled 2024-03-31 (×3): qty 2

## 2024-03-31 MED ORDER — IPRATROPIUM-ALBUTEROL 0.5-2.5 (3) MG/3ML IN SOLN
3.0000 mL | Freq: Three times a day (TID) | RESPIRATORY_TRACT | Status: DC
  Administered 2024-03-31 – 2024-04-01 (×4): 3 mL via RESPIRATORY_TRACT
  Filled 2024-03-31 (×5): qty 3

## 2024-03-31 NOTE — Progress Notes (Signed)
 " PROGRESS NOTE  Amanda York, is a 58 y.o. female, DOB - June 02, 1965, FMW:[id number]  Admit date - 03/29/2024   Admitting Physician Mikele Sifuentes Pearlean, MD  Outpatient Primary MD for the patient is [name], DO  LOS - 1  Chief Complaint  Patient presents with   Shortness of Breath      Brief Narrative:  58 y.o. female with medical history significant for ongoing tobacco abuse, COPD, HTN, hypothyroidism, HLD as well as depression and anxiety who was diagnosed with influenza A on 03/24/2024 and  started on prednisone  and Tamiflu  (she missed a couple doses of her Tamiflu  since being seen in the ED on 03/24/2024) admitted to the hospital on 03/29/2024 with worsening shortness of breath, cough, and new onset hypoxia with 3 L of oxygen via nasal cannula requirement    -Assessment and Plan: 1)Influenza A infection--diagnosed on 03/24/2024 --- Patient did Not get the flu vaccine - Respiratory symptoms have worsened since recent ED visit --No leukocytosis - Patient missed doses of Tamiflu  - Okay to complete Tamiflu  this admission --c/n  Solu-Medrol , bronchodilators and mucolytics as ordered - Chest x-ray from 03/24/2024 , and repeat chest x-ray from from 03/29/2024 without superimposed pneumonia -As of 03/30/2024 cough, dyspnea and overall respiratory status improving slowly - Continue incentive spirometry -- 2) acute COPD exacerbation--- due to #1 above- =-Management as above #1 -Continue IV Solu-Medrol , added azithromycin    3)Tobacco abuse--smoking cessation advised - Nicotine  patch as prescribed   4)Acute hypoxic respiratory failure--- due to #1 #2 above - VBG without evidence of CO2 retention - Patient desaturated with attempts to take her off O2 - Currently still requiring 2 L of oxygen at rest and 3 L with activity - Cough and dyspnea improving - Management as above #1   5)Depression/anxiety --- continue Lexapro , may use lorazepam  as needed   6)HLD--continue Crestor     7) hypothyroidism--- continue levothyroxine    8) mild hypokalemia--- replace potassium especially in the setting of bronchodilator use --  Status is: Inpatient   Disposition: The patient is from: Home              Anticipated d/c is to: Home              Anticipated d/c date is: 1 day              Patient currently is not medically stable to d/c. Barriers: Not Clinically Stable-   Code Status :  -  Code Status: Full Code   Family Communication:    NA (patient is alert, awake and coherent)   Discussed with daughter Amanda York at bedside  DVT Prophylaxis  :   - SCDs   enoxaparin  (LOVENOX ) injection 40 mg Start: 03/29/24 2200 SCDs Start: 03/29/24 1333 Place TED hose Start: 03/29/24 1333   Lab Results  Component Value Date   PLT 262 03/29/2024    Inpatient Medications  Scheduled Meds:  aspirin  EC  81 mg Oral Q breakfast   budesonide -glycopyrrolate -formoterol   2 puff Inhalation BID   dextromethorphan -guaiFENesin   1 tablet Oral BID   enoxaparin  (LOVENOX ) injection  40 mg Subcutaneous Q24H   escitalopram   20 mg Oral Daily   ipratropium-albuterol   3 mL Nebulization TID   levothyroxine   25 mcg Oral QAC breakfast   lisinopril   10 mg Oral Daily   methylPREDNISolone  (SOLU-MEDROL ) injection  40 mg Intravenous BID   nicotine   21 mg Transdermal Daily   rosuvastatin   10 mg Oral Daily   sodium chloride  flush  3  mL Intravenous Q12H   sodium chloride  flush  3 mL Intravenous Q12H   Continuous Infusions: PRN Meds:.acetaminophen  **OR** acetaminophen , albuterol , bisacodyl , LORazepam , ondansetron  **OR** ondansetron  (ZOFRAN ) IV, polyethylene glycol, sodium chloride  flush   Anti-infectives (From admission, onward)    Start     Dose/Rate Route Frequency Ordered Stop   03/29/24 1400  oseltamivir  (TAMIFLU ) capsule 75 mg        75 mg Oral Every 12 hours 03/29/24 1334 03/31/24 0119       Subjective: Amanda York today has no fevers, no emesis,  No chest pain,   -- -Patient desaturated  with attempts to take her off O2 - Currently still requiring 2 L of oxygen at rest and 3 L with activity - Daughter at bedside, questions answered - Cough and dyspnea improving   Objective: Vitals:   03/31/24 0753 03/31/24 0818 03/31/24 1253 03/31/24 1422  BP:  (!) 162/93 (!) 155/93   Pulse:  90 84   Resp:  19 17   Temp:  97.9 F (36.6 C) 98.8 F (37.1 C)   TempSrc:  Oral Oral   SpO2: 91% 90% 94% 92%  Weight:      Height:       No intake or output data in the 24 hours ending 03/31/24 1832  Filed Weights   03/29/24 0857  Weight: 63.5 kg    Physical Exam  Gen:- Awake Alert, no conversational dyspnea but patient with dyspnea on exertion HEENT:- [place].AT, No sclera icterus Nose--[place] 2L/min Neck-Supple Neck,No JVD,.  Lungs-air movement remains diminished, few scattered wheezes  CV- S1, S2 normal, regular  Abd-  +ve B.Sounds, Abd Soft, No tenderness,    Extremity/Skin:- No  edema, pedal pulses present  Psych-affect is appropriate, oriented x3 Neuro-no new focal deficits, no tremors  Data Reviewed: I have personally reviewed following labs and imaging studies  CBC: Recent Labs  Lab 03/29/24 1013  WBC 6.1  NEUTROABS 3.8  HGB 15.2*  HCT 46.4*  MCV 90.1  PLT 262   Basic Metabolic Panel: Recent Labs  Lab 03/29/24 0931 03/31/24 0511  NA 142 137  K 3.2* 4.5  CL 100 99  CO2 38* 33*  GLUCOSE 93 120*  BUN <5* 8  CREATININE 0.63 0.51  CALCIUM  9.0 9.2  PHOS  --  3.6   GFR: Estimated Creatinine Clearance: 64.7 mL/min (by C-G formula based on SCr of 0.51 mg/dL).  Recent Results (from the past 240 hours)  Resp panel by RT-PCR (RSV, Flu A&B, Covid) Anterior Nasal Swab     Status: Abnormal   Collection Time: 03/24/24  5:37 PM   Specimen: Anterior Nasal Swab  Result Value Ref Range Status   SARS Coronavirus 2 by RT PCR NEGATIVE NEGATIVE Final    Comment: (NOTE) SARS-CoV-2 target nucleic acids are NOT DETECTED.  The SARS-CoV-2 RNA is generally detectable in upper  respiratory specimens during the acute phase of infection. The lowest concentration of SARS-CoV-2 viral copies this assay can detect is 138 copies/mL. A negative result does not preclude SARS-Cov-2 infection and should not be used as the sole basis for treatment or other patient management decisions. A negative result may occur with  improper specimen collection/handling, submission of specimen other than nasopharyngeal swab, presence of viral mutation(s) within the areas targeted by this assay, and inadequate number of viral copies(<138 copies/mL). A negative result must be combined with clinical observations, patient history, and epidemiological information. The expected result is Negative.  Fact Sheet for Patients:  bloggercourse.com  Fact  Sheet for Healthcare Providers:  seriousbroker.it  This test is no t yet approved or cleared by the [place]  FDA and  has been authorized for detection and/or diagnosis of SARS-CoV-2 by FDA under an Emergency Use Authorization (EUA). This EUA will remain  in effect (meaning this test can be used) for the duration of the COVID-19 declaration under Section 564(b)(1) of the Act, 21 U.S.C.section 360bbb-3(b)(1), unless the authorization is terminated  or revoked sooner.       Influenza A by PCR POSITIVE (A) NEGATIVE Final   Influenza B by PCR NEGATIVE NEGATIVE Final    Comment: (NOTE) The Xpert Xpress SARS-CoV-2/FLU/RSV plus assay is intended as an aid in the diagnosis of influenza from Nasopharyngeal swab specimens and should not be used as a sole basis for treatment. Nasal washings and aspirates are unacceptable for Xpert Xpress SARS-CoV-2/FLU/RSV testing.  Fact Sheet for Patients: bloggercourse.com  Fact Sheet for Healthcare Providers: seriousbroker.it  This test is not yet approved or cleared by the [place]  FDA and has been  authorized for detection and/or diagnosis of SARS-CoV-2 by FDA under an Emergency Use Authorization (EUA). This EUA will remain in effect (meaning this test can be used) for the duration of the COVID-19 declaration under Section 564(b)(1) of the Act, 21 U.S.C. section 360bbb-3(b)(1), unless the authorization is terminated or revoked.     Resp Syncytial Virus by PCR NEGATIVE NEGATIVE Final    Comment: (NOTE) Fact Sheet for Patients: bloggercourse.com  Fact Sheet for Healthcare Providers: seriousbroker.it  This test is not yet approved or cleared by the [place]  FDA and has been authorized for detection and/or diagnosis of SARS-CoV-2 by FDA under an Emergency Use Authorization (EUA). This EUA will remain in effect (meaning this test can be used) for the duration of the COVID-19 declaration under Section 564(b)(1) of the Act, 21 U.S.C. section 360bbb-3(b)(1), unless the authorization is terminated or revoked.  Performed at [facility], [address], [place], [place] [address]     Radiology Studies: No results found.  Scheduled Meds:  aspirin  EC  81 mg Oral Q breakfast   budesonide -glycopyrrolate -formoterol   2 puff Inhalation BID   dextromethorphan -guaiFENesin   1 tablet Oral BID   enoxaparin  (LOVENOX ) injection  40 mg Subcutaneous Q24H   escitalopram   20 mg Oral Daily   ipratropium-albuterol   3 mL Nebulization TID   levothyroxine   25 mcg Oral QAC breakfast   lisinopril   10 mg Oral Daily   methylPREDNISolone  (SOLU-MEDROL ) injection  40 mg Intravenous BID   nicotine   21 mg Transdermal Daily   rosuvastatin   10 mg Oral Daily   sodium chloride  flush  3 mL Intravenous Q12H   sodium chloride  flush  3 mL Intravenous Q12H   Continuous Infusions:   LOS: 1 day   Rendall Carwin M.D on 03/31/2024 at 6:32 PM  Go to [email or web address] - for contact info  [facility] - Office  [phone]  If 7PM-7AM, please contact  night-coverage [email or web address] 03/31/2024, 6:32 PM    "

## 2024-03-31 NOTE — Plan of Care (Signed)

## 2024-04-01 LAB — GLUCOSE, CAPILLARY
Glucose-Capillary: 115 mg/dL — ABNORMAL HIGH (ref 70–99)
Glucose-Capillary: 90 mg/dL (ref 70–99)

## 2024-04-01 MED ORDER — GUAIFENESIN ER 600 MG PO TB12: 600.0000 mg | ORAL_TABLET | Freq: Two times a day (BID) | ORAL | 0 refills | Status: AC

## 2024-04-01 MED ORDER — AZITHROMYCIN 500 MG PO TABS: 500.0000 mg | ORAL_TABLET | Freq: Every day | ORAL | 0 refills | Status: AC

## 2024-04-01 MED ORDER — TRELEGY ELLIPTA 200-62.5-25 MCG/ACT IN AEPB: 1.0000 | INHALATION_SPRAY | Freq: Every day | RESPIRATORY_TRACT | 3 refills | Status: AC

## 2024-04-01 MED ORDER — LISINOPRIL 10 MG PO TABS: 10.0000 mg | ORAL_TABLET | Freq: Every day | ORAL | 3 refills | Status: AC

## 2024-04-01 MED ORDER — ROSUVASTATIN CALCIUM 10 MG PO TABS: 10.0000 mg | ORAL_TABLET | Freq: Every day | ORAL | 3 refills | Status: AC

## 2024-04-01 MED ORDER — IPRATROPIUM-ALBUTEROL 0.5-2.5 (3) MG/3ML IN SOLN: 3.0000 mL | Freq: Four times a day (QID) | RESPIRATORY_TRACT | 3 refills | Status: AC | PRN

## 2024-04-01 MED ORDER — ESCITALOPRAM OXALATE 20 MG PO TABS: 20.0000 mg | ORAL_TABLET | Freq: Every day | ORAL | 3 refills | Status: AC

## 2024-04-01 MED ORDER — NICOTINE 21 MG/24HR TD PT24: 21.0000 mg | MEDICATED_PATCH | Freq: Every day | TRANSDERMAL | 0 refills | Status: AC

## 2024-04-01 MED ORDER — PREDNISONE 20 MG PO TABS: 40.0000 mg | ORAL_TABLET | Freq: Every day | ORAL | 0 refills | Status: AC

## 2024-04-01 MED ORDER — LEVOTHYROXINE SODIUM 25 MCG PO TABS: 25.0000 ug | ORAL_TABLET | Freq: Every day | ORAL | 3 refills | Status: AC

## 2024-04-01 MED ORDER — ALBUTEROL SULFATE HFA 108 (90 BASE) MCG/ACT IN AERS: 2.0000 | INHALATION_SPRAY | RESPIRATORY_TRACT | 4 refills | Status: AC | PRN

## 2024-04-01 MED ORDER — ASPIRIN 81 MG PO TBEC: 81.0000 mg | DELAYED_RELEASE_TABLET | Freq: Every day | ORAL | 11 refills | Status: AC

## 2024-04-01 NOTE — Discharge Instructions (Signed)
 1)you need oxygen at home at 2 L via nasal cannula continuously while awake and while asleep--- smoking or having open fires around oxygen can cause fire, significant injury and death  2) complete abstinence from tobacco advised--- May use over-the-counter nicotine  patch to help you quit smoking  3) please note that there has been a few changes to your medications  4) please follow-up with your primary care physician Sharla, Ashly M, DO ) in about 3 to 4 weeks for recheck and to see if you still need oxygen

## 2024-04-01 NOTE — Plan of Care (Signed)
" °  Problem: Clinical Measurements: Goal: Respiratory complications will improve Outcome: Progressing   Problem: Coping: Goal: Level of anxiety will decrease Outcome: Progressing   Problem: Education: Goal: Knowledge of disease or condition will improve Outcome: Progressing Goal: Knowledge of the prescribed therapeutic regimen will improve Outcome: Progressing   Problem: Activity: Goal: Ability to tolerate increased activity will improve Outcome: Progressing Goal: Will verbalize the importance of balancing activity with adequate rest periods Outcome: Progressing   Problem: Respiratory: Goal: Ability to maintain a clear airway will improve Outcome: Progressing Goal: Levels of oxygenation will improve Outcome: Progressing Goal: Ability to maintain adequate ventilation will improve Outcome: Progressing   "

## 2024-04-01 NOTE — Progress Notes (Signed)
" °  °  SATURATION QUALIFICATIONS: (This note is used to comply with regulatory documentation for home oxygen)   Patient Saturations on Room Air at Rest = 87 %   Patient Saturations on Room Air while Ambulating = 83 %   Patient Saturations on 2 Liters of oxygen while Ambulating = 92 %    Patient needs continuous O2 at 2 L/min continuously via nasal cannula with humidifier, with gaseous portability and conserving device     Dx--COPD with hypoxic respiratory failure  Rendall Carwin, MD  "

## 2024-04-01 NOTE — TOC Transition Note (Signed)
 Transition of Care [facility]) - Discharge Note   Patient Details  Name: SHATINA STREETS MRN: [id number] Date of Birth: 12-19-1965  Transition of Care [facility]) CM/SW Contact:  Sharlyne Stabs, RN Phone Number: 04/01/2024, 2:12 PM   Clinical Narrative:   Patient discharged home. [employer] delivered home oxygen.     Final next level of care: Home/Self Care Barriers to Discharge: Barriers Resolved   Patient Goals and CMS Choice Patient states their goals for this hospitalization and ongoing recovery are:: return home CMS Medicare.gov Compare Post Acute Care list provided to:: Patient Choice offered to / list presented to : Patient [place] ownership interest in [facility].provided to:: Patient    Discharge Placement                    Patient and family notified of of transfer: 04/01/24  Discharge Plan and Services Additional resources added to the After Visit Summary for                  DME Arranged: Oxygen DME Agency: [employer] Date DME Agency Contacted: 04/01/24 Time DME Agency Contacted: 1200              Social Drivers of Health (SDOH) Interventions SDOH Screenings   Food Insecurity: No Food Insecurity (03/29/2024)  Housing: Low Risk (03/29/2024)  Transportation Needs: No Transportation Needs (03/29/2024)  Utilities: Not At Risk (03/29/2024)  Alcohol Screen: Low Risk (03/26/2023)  Depression (PHQ2-9): Low Risk (02/27/2024)  Financial Resource Strain: Low Risk (03/26/2023)  Physical Activity: Inactive (03/26/2023)  Social Connections: Socially Isolated (03/26/2023)  Stress: No Stress Concern Present (03/26/2023)  Tobacco Use: High Risk (03/29/2024)

## 2024-04-01 NOTE — Discharge Summary (Signed)
 "                                                                               Amanda York, is a 58 y.o. female  DOB May 30, 1965  MRN [id number].  Admission date:  03/29/2024  Admitting Physician  Rendall Carwin, MD  Discharge Date:  04/01/2024   Primary MD  [name], Amanda York  Recommendations for primary care physician for things to follow:  1)you need oxygen at home at 2 L via nasal cannula continuously while awake and while asleep--- smoking or having open fires around oxygen can cause fire, significant injury and death  2) complete abstinence from tobacco advised--- May use over-the-counter nicotine  patch to help you quit smoking  3) please note that there has been a few changes to your medications  4) please follow-up with your primary care physician Amanda York, Amanda York, Amanda York ) in about 3 to 4 weeks for recheck and to see if you still need oxygen  Admission Diagnosis  Hypoxia [R09.02] Influenza A with respiratory manifestations [J10.1] COPD with acute exacerbation (HCC) [J44.1]  Discharge Diagnosis  Hypoxia [R09.02] Influenza A with respiratory manifestations [J10.1] COPD with acute exacerbation (HCC) [J44.1]    Principal Problem:   COPD with acute exacerbation (HCC) Active Problems:   Influenza A   Essential hypertension   Tobacco abuse   Acquired hypothyroidism     Past Medical History:  Diagnosis Date   Arthritis    Atypical chest pain 10/20/2014   Chest pain 10/19/2014   COPD (chronic obstructive pulmonary disease) (HCC)    Hypertension    Hypothyroidism    Tobacco abuse    Past Surgical History:  Procedure Laterality Date   none       HPI  from the history and physical done on the day of admission:   HPI: Amanda York is a 58 y.o. female with medical history significant for ongoing tobacco abuse, COPD, HTN, hypothyroidism, HLD as well as depression and anxiety who was diagnosed with influenza A on 03/24/2024 and  started on prednisone  and Tamiflu   (she missed a couple doses of her Tamiflu  since being seen in the ED on 03/24/2024) presents to the ED again today with worsening shortness of breath, cough, and hypoxia -- Additional history obtained from patient's daughter Blimi at bedside - Per EMS O2 sats was down to 79% on room air patient currently on 3 L after interventions including IV Solu-Medrol  and bronchodilators - Patient endorses myalgias, fatigue and malaise--wheezing improved in the ED after bronchodilators , but hypoxia persist -- Patient's 50 year old nephew also has influenza -- Patient did Not get the flu vaccine No Nausea, Vomiting or Diarrhea - Patient reports poor appetite and poor oral intake - No frank chest pains   Chest x-ray on 03/29/2024 and prior chest x-ray from 03/24/2024 without acute findings patient does have chronic findings consistent with emphysema On 03/24/2024 during previous ED visit patient was positive for influenza A negative for RSV negative for COVID -- VBG without elevated pCO2 - WBC 6.1 hemoglobin is 15.2 platelets 262 - Sodium is 142, potassium is low at 3.2 chloride 100 bicarb 38 creatinine 0.63, anion gap 3, bicarb is  38   Review of Systems: As mentioned in the history of present illness. All other systems reviewed and are negative.   Hospital Course:   Brief Narrative:  58 y.o. female with medical history significant for ongoing tobacco abuse, COPD, HTN, hypothyroidism, HLD as well as depression and anxiety who was diagnosed with influenza A on 03/24/2024 and  started on prednisone  and Tamiflu  (she missed a couple doses of her Tamiflu  since being seen in the ED on 03/24/2024) admitted to the hospital on 03/29/2024 with worsening shortness of breath, cough, and new onset hypoxia with 3 L of oxygen via nasal cannula requirement     -Assessment and Plan: 1)Influenza A infection--diagnosed on 03/24/2024 --- Patient did Not get the flu vaccine --No leukocytosis - Patient missed doses of  Tamiflu  - Okay to complete Tamiflu  this admission --c/n  Solu-Medrol , bronchodilators and mucolytics as ordered - Chest x-ray from 03/24/2024 , and repeat chest x-ray from from 03/29/2024 -completed Tamiflu  -Overall respiratory status improved, but hypoxia persist - Continue incentive spirometry -- 2)Acute COPD exacerbation--- due to #1 above- =-Management as above #1 Much improved after treatment with IV Solu-Medrol , azithromycin , mucolytics and bronchodilators -Okay to discharge on prednisone  , azithromycin  and bronchodilators with mucolytic   3)Tobacco abuse--smoking cessation advised - Nicotine  patch as prescribed   4)Acute hypoxic respiratory failure--- due to #1 #2 above - VBG without evidence of CO2 retention - -Unable to wean off oxygen -Okay to discharge home on 2 L of oxygen via nasal cannula   5)Depression/anxiety --- continue Lexapro , may use lorazepam  as needed   6)HLD--continue Crestor    7) hypothyroidism--- continue levothyroxine    8) mild hypokalemia---in the setting of bronchodilator use -- Potassium replaced and normalized  Disposition: The patient is from: Home              Anticipated d/c is to: Home  Discharge Condition: Stable,  Follow UP   Follow-up Information     Jolinda Potter York, Amanda York. Schedule an appointment as soon as possible for a visit in 1 month(s).   Specialty: Family Medicine Why: Recheck to see if he still need home oxygen after about 3 to 4 weeks from now Contact information: [address] [place] [address] [phone]                Diet and Activity recommendation:  As advised  Discharge Instructions    Discharge Instructions     Call MD for:  difficulty breathing, headache or visual disturbances   Complete by: As directed    Call MD for:  persistant dizziness or light-headedness   Complete by: As directed    Call MD for:  persistant nausea and vomiting   Complete by: As directed    Call MD for:  temperature  >100.4   Complete by: As directed    Diet - low sodium heart healthy   Complete by: As directed    Discharge instructions   Complete by: As directed    1)you need oxygen at home at 2 L via nasal cannula continuously while awake and while asleep--- smoking or having open fires around oxygen can cause fire, significant injury and death  2) complete abstinence from tobacco advised--- May use over-the-counter nicotine  patch to help you quit smoking  3) please note that there has been a few changes to your medications  4) please follow-up with your primary care physician Amanda York, Amanda York, Amanda York ) in about 3 to 4 weeks for recheck and to see  if you still need oxygen   Increase activity slowly   Complete by: As directed        Discharge Medications     Allergies as of 04/01/2024       Reactions   Alendronate     Reported internal allergy. ?cause but this was the only thing started around the time she was evaluated in ER.        Medication List     STOP taking these medications    cetirizine  10 MG tablet Commonly known as: ZYRTEC    oseltamivir  75 MG capsule Commonly known as: TAMIFLU        TAKE these medications    albuterol  108 (90 Base) MCG/ACT inhaler Commonly known as: VENTOLIN  HFA Inhale 2 puffs into the lungs every 4 (four) hours as needed for wheezing or shortness of breath. What changed: when to take this   aspirin  EC 81 MG tablet Take 1 tablet (81 mg total) by mouth daily with breakfast. What changed: when to take this   azithromycin  500 MG tablet Commonly known as: ZITHROMAX  Take 1 tablet (500 mg total) by mouth daily for 3 days.   escitalopram  20 MG tablet Commonly known as: LEXAPRO  Take 1 tablet (20 mg total) by mouth daily.   guaiFENesin  600 MG 12 hr tablet Commonly known as: Mucinex  Take 1 tablet (600 mg total) by mouth 2 (two) times daily.   ipratropium-albuterol  0.5-2.5 (3) MG/3ML Soln Commonly known as: DUONEB Take 3 mLs by nebulization  every 6 (six) hours as needed.   levothyroxine  25 MCG tablet Commonly known as: SYNTHROID  Take 1 tablet (25 mcg total) by mouth daily before breakfast.   lisinopril  10 MG tablet Commonly known as: ZESTRIL  Take 1 tablet (10 mg total) by mouth daily.   LORazepam  0.5 MG tablet Commonly known as: ATIVAN  Take 1 tablet 1 hour before scheduled scan. May repeat dose 1 time before scan if needed   nicotine  21 mg/24hr patch Commonly known as: NICODERM CQ  - dosed in mg/24 hours Place 1 patch (21 mg total) onto the skin daily. Start taking on: April 02, 2024   predniSONE  20 MG tablet Commonly known as: DELTASONE  Take 2 tablets (40 mg total) by mouth daily with breakfast for 5 days. What changed:  medication strength how much to take   rosuvastatin  10 MG tablet Commonly known as: Crestor  Take 1 tablet (10 mg total) by mouth daily.   Trelegy Ellipta  200-62.5-25 MCG/ACT Aepb Generic drug: Fluticasone -Umeclidin-Vilant Inhale 1 puff into the lungs daily. What changed: additional instructions   Vitamin D  (Ergocalciferol ) 1.25 MG (50000 UNIT) Caps capsule Commonly known as: DRISDOL  Take 1 capsule (50,000 Units total) by mouth every 7 (seven) days.               Durable Medical Equipment  (From admission, onward)           Start     Ordered   04/01/24 1155  For home use only DME oxygen  Once       Comments: SATURATION QUALIFICATIONS: (This note is used to comply with regulatory documentation for home oxygen)   Patient Saturations on Room Air at Rest = 87 %   Patient Saturations on Room Air while Ambulating = 83 %   Patient Saturations on 2 Liters of oxygen while Ambulating = 92 %    Patient needs continuous O2 at 2 L/min continuously via nasal cannula with humidifier, with gaseous portability and conserving device     Dx--COPD with hypoxic respiratory  failure  Question Answer Comment  Length of Need Lifetime   Mode or (Route) Nasal cannula   Liters per Minute 2    Frequency Continuous (stationary and portable oxygen unit needed)   Oxygen conserving device Yes   Oxygen delivery system: Gas   Oxygen delivery system: Concentrator   Oxygen delivery system: Portable concentrator (POC)      04/01/24 1155           Major procedures and Radiology Reports - PLEASE review detailed and final reports for all details, in brief -   DG Chest Port 1 View Result Date: 03/29/2024 CLINICAL DATA:  Shortness of breath.  Influenza infection. EXAM: PORTABLE CHEST 1 VIEW COMPARISON:  03/24/2024 FINDINGS: Rotated film. The cardiopericardial silhouette is within normal limits for size. Interstitial markings are diffusely coarsened with chronic features. Hyperexpansion suggests emphysema. No acute bony abnormality. IMPRESSION: Chronic interstitial coarsening without acute cardiopulmonary findings. Electronically Signed   By: Camellia Candle York.D.   On: 03/29/2024 10:28   DG Chest Portable 1 View Result Date: 03/24/2024 CLINICAL DATA:  Shortness of breath. EXAM: PORTABLE CHEST 1 VIEW COMPARISON:  Chest radiograph dated 02/19/2021. FINDINGS: Background of emphysema. No focal consolidation, pleural effusion, pneumothorax. The cardiac silhouette is within normal limits. No acute osseous pathology. IMPRESSION: No active disease. Electronically Signed   By: Vanetta Chou York.D.   On: 03/24/2024 18:48   Micro Results   Recent Results (from the past 240 hours)  Resp panel by RT-PCR (RSV, Flu A&B, Covid) Anterior Nasal Swab     Status: Abnormal   Collection Time: 03/24/24  5:37 PM   Specimen: Anterior Nasal Swab  Result Value Ref Range Status   SARS Coronavirus 2 by RT PCR NEGATIVE NEGATIVE Final    Comment: (NOTE) SARS-CoV-2 target nucleic acids are NOT DETECTED.  The SARS-CoV-2 RNA is generally detectable in upper respiratory specimens during the acute phase of infection. The lowest concentration of SARS-CoV-2 viral copies this assay can detect is 138 copies/mL. A negative  result does not preclude SARS-Cov-2 infection and should not be used as the sole basis for treatment or other patient management decisions. A negative result may occur with  improper specimen collection/handling, submission of specimen other than nasopharyngeal swab, presence of viral mutation(s) within the areas targeted by this assay, and inadequate number of viral copies(<138 copies/mL). A negative result must be combined with clinical observations, patient history, and epidemiological information. The expected result is Negative.  Fact Sheet for Patients:  bloggercourse.com  Fact Sheet for Healthcare Providers:  seriousbroker.it  This test is no t yet approved or cleared by the [place]  FDA and  has been authorized for detection and/or diagnosis of SARS-CoV-2 by FDA under an Emergency Use Authorization (EUA). This EUA will remain  in effect (meaning this test can be used) for the duration of the COVID-19 declaration under Section 564(b)(1) of the Act, 21 U.S.C.section 360bbb-3(b)(1), unless the authorization is terminated  or revoked sooner.       Influenza A by PCR POSITIVE (A) NEGATIVE Final   Influenza B by PCR NEGATIVE NEGATIVE Final    Comment: (NOTE) The Xpert Xpress SARS-CoV-2/FLU/RSV plus assay is intended as an aid in the diagnosis of influenza from Nasopharyngeal swab specimens and should not be used as a sole basis for treatment. Nasal washings and aspirates are unacceptable for Xpert Xpress SARS-CoV-2/FLU/RSV testing.  Fact Sheet for Patients: bloggercourse.com  Fact Sheet for Healthcare Providers: seriousbroker.it  This test is not yet approved or cleared  by the [place]  FDA and has been authorized for detection and/or diagnosis of SARS-CoV-2 by FDA under an Emergency Use Authorization (EUA). This EUA will remain in effect (meaning this test can be  used) for the duration of the COVID-19 declaration under Section 564(b)(1) of the Act, 21 U.S.C. section 360bbb-3(b)(1), unless the authorization is terminated or revoked.     Resp Syncytial Virus by PCR NEGATIVE NEGATIVE Final    Comment: (NOTE) Fact Sheet for Patients: bloggercourse.com  Fact Sheet for Healthcare Providers: seriousbroker.it  This test is not yet approved or cleared by the [place]  FDA and has been authorized for detection and/or diagnosis of SARS-CoV-2 by FDA under an Emergency Use Authorization (EUA). This EUA will remain in effect (meaning this test can be used) for the duration of the COVID-19 declaration under Section 564(b)(1) of the Act, 21 U.S.C. section 360bbb-3(b)(1), unless the authorization is terminated or revoked.  Performed at [facility], [address] [place], [place] [address]    Today   Subjective    Amanda York today has no new complaints No fever  Or chills   No Nausea, Vomiting or Diarrhea Patient's daughter Amanda York at bedside, - Questions answered    -- Cough and dyspnea has improved, unable to wean off oxygen at this time  Patient has been seen and examined prior to discharge   Objective   Blood pressure (!) 143/88, pulse 77, temperature 97.9 F (36.6 C), temperature source Oral, resp. rate 19, height 5' 1 (1.549 York), weight 63.5 kg, SpO2 92%.   Intake/Output Summary (Last 24 hours) at 04/01/2024 1345 Last data filed at 04/01/2024 0600 Gross per 24 hour  Intake 360 ml  Output --  Net 360 ml   Exam Gen:- Awake Alert, no acute distress, no conversational dyspnea HEENT:- [place].AT, No sclera icterus Nose- [place] 2L/min Neck-Supple Neck,No JVD,.  Lungs-improving air movement, no significant wheezing CV- S1, S2 normal, regular Abd-  +ve B.Sounds, Abd Soft, No tenderness,    Extremity/Skin:- No  edema,   good pulses Psych-affect is appropriate, oriented x3 Neuro-no new  focal deficits, no tremors    Data Review   CBC w Diff:  Lab Results  Component Value Date   WBC 6.1 03/29/2024   HGB 15.2 (H) 03/29/2024   HGB 15.6 02/27/2024   HCT 46.4 (H) 03/29/2024   HCT 47.8 (H) 02/27/2024   PLT 262 03/29/2024   PLT 286 02/27/2024   LYMPHOPCT 28 03/29/2024   MONOPCT 7 03/29/2024   EOSPCT 0 03/29/2024   BASOPCT 1 03/29/2024   CMP:  Lab Results  Component Value Date   NA 137 03/31/2024   NA 134 02/27/2024   K 4.5 03/31/2024   CL 99 03/31/2024   CO2 33 (H) 03/31/2024   BUN 8 03/31/2024   BUN 4 (L) 02/27/2024   CREATININE 0.51 03/31/2024   PROT 6.5 02/27/2024   ALBUMIN 3.9 03/31/2024   ALBUMIN 4.5 02/27/2024   BILITOT 0.7 02/27/2024   ALKPHOS 68 02/27/2024   AST 19 02/27/2024   ALT 13 02/27/2024  . Total Discharge time is about 33 minutes  Rendall Carwin York.D on 04/01/2024 at 1:45 PM  Go to [email or web address] -  for contact info  [facility] - Office  [phone]   "

## 2024-04-02 ENCOUNTER — Telehealth: Payer: Self-pay

## 2024-04-02 NOTE — Transitions of Care (Post Inpatient/ED Visit) (Signed)
" ° °  04/02/2024  Name: Amanda York MRN: [id number] DOB: [birth year]  Today's TOC FU Call Status: Today's TOC FU Call Status:: Unsuccessful Call (1st Attempt) Unsuccessful Call (1st Attempt) Date: 04/02/24  Attempted to reach the patient regarding the most recent Inpatient/ED visit.  Follow Up Plan: Additional outreach attempts will be made to reach the patient to complete the Transitions of Care (Post Inpatient/ED visit) call.   Alan Ee, RN, BSN, CEN Population Health- Transition of Care Team.  [facility] [phone]  "

## 2024-04-22 LAB — COLOGUARD: COLOGUARD: POSITIVE — AB

## 2024-04-23 ENCOUNTER — Ambulatory Visit: Payer: Self-pay | Admitting: Family Medicine

## 2024-04-23 DIAGNOSIS — R195 Other fecal abnormalities: Secondary | ICD-10-CM

## 2024-04-25 ENCOUNTER — Telehealth: Payer: Self-pay | Admitting: Family Medicine

## 2024-04-25 NOTE — Telephone Encounter (Signed)
 I spoke with patient. She will wait to see how the weather is next week and see if any openings come up. She is ok to schedule with DR Ahmed. Copied from CRM [id number]. Topic: Referral - Question >> Apr 25, 2024  1:10 PM Victoria B wrote: Reason for CRM: Patient called in states Raynaldo Plough had to cancel her appt for Jan 27 and can't see her now until feb 25 , so she wants to know can she get another referral earlier some place else

## 2024-04-28 ENCOUNTER — Ambulatory Visit: Admitting: Gastroenterology

## 2024-05-01 ENCOUNTER — Ambulatory Visit: Admitting: Gastroenterology

## 2024-05-01 ENCOUNTER — Encounter: Payer: Self-pay | Admitting: Gastroenterology

## 2024-05-01 ENCOUNTER — Encounter: Payer: Self-pay | Admitting: *Deleted

## 2024-05-01 ENCOUNTER — Other Ambulatory Visit: Payer: Self-pay | Admitting: *Deleted

## 2024-05-01 VITALS — BP 148/92 | HR 75 | Temp 98.6°F | Ht 61.0 in | Wt 132.4 lb

## 2024-05-01 DIAGNOSIS — R195 Other fecal abnormalities: Secondary | ICD-10-CM | POA: Diagnosis not present

## 2024-05-01 MED ORDER — PEG 3350-KCL-NA BICARB-NACL 420 G PO SOLR: 4000.0000 mL | Freq: Once | ORAL | 0 refills | Status: AC

## 2024-05-01 NOTE — Patient Instructions (Signed)
We are arranging a colonoscopy with Dr. Gala Romney in the near future.  Further recommendations to follow!  It was a pleasure to see you today. I want to create trusting relationships with patients and provide genuine, compassionate, and quality care. I truly value your feedback, so please be on the lookout for a survey regarding your visit with me today. I appreciate your time in completing this!    Annitta Needs, PhD, ANP-BC [facility]

## 2024-05-01 NOTE — Progress Notes (Signed)
 "   Gastroenterology Office Note    Referring Provider: [name], DO Primary Care Physician:  [name], DO  Primary GI: Dr. Shaaron    Chief Complaint   Chief Complaint  Patient presents with   New Patient (Initial Visit)    Pt here because she tested POS on cologuard     History of Present Illness   Amanda York is a 59 y.o. female presenting today at the request of Gottschalk, Ashly M, DO due to positive Cologuard test recently. No prior colonoscopy. No FH colon cancer or polyps.  No abdominal pain, N/V, changes in bowel habits, constipation, diarrhea, overt GI bleeding, GERD, dysphagia, unexplained weight loss, lack of appetite, unexplained weight gain.   Flu in Dec 2025 and was sent home on 2 liters nasal cannula for nighttime use if needed but has not required.       Past Medical History:  Diagnosis Date   Arthritis    Atypical chest pain 10/20/2014   Chest pain 10/19/2014   COPD (chronic obstructive pulmonary disease) (HCC)    Hypercholesterolemia    Hypertension    Hypothyroidism    Tobacco abuse     Past Surgical History:  Procedure Laterality Date   none      Current Outpatient Medications  Medication Sig Dispense Refill   albuterol  (VENTOLIN  HFA) 108 (90 Base) MCG/ACT inhaler Inhale 2 puffs into the lungs every 4 (four) hours as needed for wheezing or shortness of breath. 8 g 4   aspirin  EC 81 MG tablet Take 1 tablet (81 mg total) by mouth daily with breakfast. 30 tablet 11   escitalopram  (LEXAPRO ) 20 MG tablet Take 1 tablet (20 mg total) by mouth daily. 90 tablet 3   Fluticasone -Umeclidin-Vilant (TRELEGY ELLIPTA ) 200-62.5-25 MCG/ACT AEPB Inhale 1 puff into the lungs daily. 180 each 3   guaiFENesin  (MUCINEX ) 600 MG 12 hr tablet Take 1 tablet (600 mg total) by mouth 2 (two) times daily. 20 tablet 0   ipratropium-albuterol  (DUONEB) 0.5-2.5 (3) MG/3ML SOLN Take 3 mLs by nebulization every 6 (six) hours as needed. 360 mL 3    levothyroxine  (SYNTHROID ) 25 MCG tablet Take 1 tablet (25 mcg total) by mouth daily before breakfast. 90 tablet 3   lisinopril  (ZESTRIL ) 10 MG tablet Take 1 tablet (10 mg total) by mouth daily. 90 tablet 3   nicotine  (NICODERM CQ  - DOSED IN MG/24 HOURS) 21 mg/24hr patch Place 1 patch (21 mg total) onto the skin daily. 28 patch 0   rosuvastatin  (CRESTOR ) 10 MG tablet Take 1 tablet (10 mg total) by mouth daily. 90 tablet 3   Vitamin D , Ergocalciferol , (DRISDOL ) 1.25 MG (50000 UNIT) CAPS capsule Take 1 capsule (50,000 Units total) by mouth every 7 (seven) days. 12 capsule 3   No current facility-administered medications for this visit.    Allergies as of 05/01/2024 - Review Complete 05/01/2024  Allergen Reaction Noted   Alendronate   01/09/2022    Family History  Problem Relation Age of Onset   CAD Father        Died at 54 of [place]   Coronary artery disease Father    COPD Sister    Breast cancer Maternal Grandmother    CAD Paternal Grandfather        Details unclaer   CAD Paternal Uncle        Died at 74 of [place]   Colon polyps Neg Hx    Colon cancer Neg Hx  Social History   Socioeconomic History   Marital status: Married    Spouse name: Not on file   Number of children: Not on file   Years of education: Not on file   Highest education level: Not on file  Occupational History   Not on file  Tobacco Use   Smoking status: Every Day    Current packs/day: 1.00    Average packs/day: 1 pack/day for 41.0 years (41.0 ttl pk-yrs)    Types: Cigarettes    Passive exposure: Current   Smokeless tobacco: Never   Tobacco comments:    Since age 54  Vaping Use   Vaping status: Never Used  Substance and Sexual Activity   Alcohol use: No    Alcohol/week: 0.0 standard drinks of alcohol   Drug use: No   Sexual activity: Yes  Other Topics Concern   Not on file  Social History Narrative   Not on file   Social Drivers of Health   Tobacco Use: High Risk (05/01/2024)   Patient History     Smoking Tobacco Use: Every Day    Smokeless Tobacco Use: Never    Passive Exposure: Current  Financial Resource Strain: Low Risk (03/26/2023)   Overall Financial Resource Strain (CARDIA)    Difficulty of Paying Living Expenses: Not hard at all  Food Insecurity: No Food Insecurity (03/29/2024)   Epic    Worried About Radiation Protection Practitioner of Food in the Last Year: Never true    Ran Out of Food in the Last Year: Never true  Transportation Needs: No Transportation Needs (03/29/2024)   Epic    Lack of Transportation (Medical): No    Lack of Transportation (Non-Medical): No  Physical Activity: Inactive (03/26/2023)   Exercise Vital Sign    Days of Exercise per Week: 0 days    Minutes of Exercise per Session: 0 min  Stress: No Stress Concern Present (03/26/2023)   [employer] of Occupational Health - Occupational Stress Questionnaire    Feeling of Stress : Not at all  Social Connections: Socially Isolated (03/26/2023)   Social Connection and Isolation Panel    Frequency of Communication with Friends and Family: Never    Frequency of Social Gatherings with Friends and Family: Never    Attends Religious Services: Never    Database Administrator or Organizations: No    Attends Banker Meetings: Never    Marital Status: Married  Catering Manager Violence: Not At Risk (03/29/2024)   Epic    Fear of Current or Ex-Partner: No    Emotionally Abused: No    Physically Abused: No    Sexually Abused: No  Depression (PHQ2-9): Low Risk (02/27/2024)   Depression (PHQ2-9)    PHQ-2 Score: 0  Alcohol Screen: Low Risk (03/26/2023)   Alcohol Screen    Last Alcohol Screening Score (AUDIT): 0  Housing: Low Risk (03/29/2024)   Epic    Unable to Pay for Housing in the Last Year: No    Number of Times Moved in the Last Year: 0    Homeless in the Last Year: No  Utilities: Not At Risk (03/29/2024)   Epic    Threatened with loss of utilities: No  Health Literacy: Not on file     Review  of Systems   Gen: Denies any fever, chills, fatigue, weight loss, lack of appetite.  CV: Denies chest pain, heart palpitations, peripheral edema, syncope.  Resp: Denies shortness of breath at rest or with exertion. Denies wheezing or  cough.  GI: Denies dysphagia or odynophagia. Denies jaundice, hematemesis, fecal incontinence. GU : Denies urinary burning, urinary frequency, urinary hesitancy MS: Denies joint pain, muscle weakness, cramps, or limitation of movement.  Derm: Denies rash, itching, dry skin Psych: Denies depression, anxiety, memory loss, and confusion Heme: Denies bruising, bleeding, and enlarged lymph nodes.   Physical Exam   BP (!) 148/92   Pulse 75   Temp 98.6 F (37 C)   Ht 5' 1 (1.549 m)   Wt 132 lb 6.4 oz (60.1 kg)   BMI 25.02 kg/m  General:   Alert and oriented. Pleasant and cooperative. Well-nourished and well-developed.  Head:  Normocephalic and atraumatic. Eyes:  Without icterus Ears:  Normal auditory acuity. Lungs:  Clear to auscultation bilaterally.  Heart:  S1, S2 present without murmurs appreciated.  Abdomen:  +BS, soft, non-tender and non-distended. No HSM noted. No guarding or rebound. No masses appreciated.  Rectal:  Deferred  Msk:  Symmetrical without gross deformities. Normal posture. Extremities:  Without edema. Neurologic:  Alert and  oriented x4;  grossly normal neurologically. Skin:  Intact without significant lesions or rashes. Psych:  Alert and cooperative. Normal mood and affect.   Assessment/Plan:    Amanda York is a 59 y.o. female presenting today with positive Cologuard recently but no lower or upper GI signs/symptoms of concern. No prior colonoscopy, and she has no FH colon cancer or polyps.   Will arrange colonoscopy in the near future with Dr. Shaaron. Discussed risks and benefits with stated understanding.   Further recommendations to follow.    Therisa MICAEL Stager, PhD, ANP-BC [facility]    "

## 2024-05-01 NOTE — H&P (View-Only) (Signed)
 "   Gastroenterology Office Note    Referring Provider: [name], DO Primary Care Physician:  [name], DO  Primary GI: Dr. Shaaron    Chief Complaint   Chief Complaint  Patient presents with   New Patient (Initial Visit)    Pt here because she tested POS on cologuard     History of Present Illness   Amanda York is a 59 y.o. female presenting today at the request of Gottschalk, Ashly M, DO due to positive Cologuard test recently. No prior colonoscopy. No FH colon cancer or polyps.  No abdominal pain, N/V, changes in bowel habits, constipation, diarrhea, overt GI bleeding, GERD, dysphagia, unexplained weight loss, lack of appetite, unexplained weight gain.   Flu in Dec 2025 and was sent home on 2 liters nasal cannula for nighttime use if needed but has not required.       Past Medical History:  Diagnosis Date   Arthritis    Atypical chest pain 10/20/2014   Chest pain 10/19/2014   COPD (chronic obstructive pulmonary disease) (HCC)    Hypercholesterolemia    Hypertension    Hypothyroidism    Tobacco abuse     Past Surgical History:  Procedure Laterality Date   none      Current Outpatient Medications  Medication Sig Dispense Refill   albuterol  (VENTOLIN  HFA) 108 (90 Base) MCG/ACT inhaler Inhale 2 puffs into the lungs every 4 (four) hours as needed for wheezing or shortness of breath. 8 g 4   aspirin  EC 81 MG tablet Take 1 tablet (81 mg total) by mouth daily with breakfast. 30 tablet 11   escitalopram  (LEXAPRO ) 20 MG tablet Take 1 tablet (20 mg total) by mouth daily. 90 tablet 3   Fluticasone -Umeclidin-Vilant (TRELEGY ELLIPTA ) 200-62.5-25 MCG/ACT AEPB Inhale 1 puff into the lungs daily. 180 each 3   guaiFENesin  (MUCINEX ) 600 MG 12 hr tablet Take 1 tablet (600 mg total) by mouth 2 (two) times daily. 20 tablet 0   ipratropium-albuterol  (DUONEB) 0.5-2.5 (3) MG/3ML SOLN Take 3 mLs by nebulization every 6 (six) hours as needed. 360 mL 3    levothyroxine  (SYNTHROID ) 25 MCG tablet Take 1 tablet (25 mcg total) by mouth daily before breakfast. 90 tablet 3   lisinopril  (ZESTRIL ) 10 MG tablet Take 1 tablet (10 mg total) by mouth daily. 90 tablet 3   nicotine  (NICODERM CQ  - DOSED IN MG/24 HOURS) 21 mg/24hr patch Place 1 patch (21 mg total) onto the skin daily. 28 patch 0   rosuvastatin  (CRESTOR ) 10 MG tablet Take 1 tablet (10 mg total) by mouth daily. 90 tablet 3   Vitamin D , Ergocalciferol , (DRISDOL ) 1.25 MG (50000 UNIT) CAPS capsule Take 1 capsule (50,000 Units total) by mouth every 7 (seven) days. 12 capsule 3   No current facility-administered medications for this visit.    Allergies as of 05/01/2024 - Review Complete 05/01/2024  Allergen Reaction Noted   Alendronate   01/09/2022    Family History  Problem Relation Age of Onset   CAD Father        Died at 54 of [place]   Coronary artery disease Father    COPD Sister    Breast cancer Maternal Grandmother    CAD Paternal Grandfather        Details unclaer   CAD Paternal Uncle        Died at 74 of [place]   Colon polyps Neg Hx    Colon cancer Neg Hx  Social History   Socioeconomic History   Marital status: Married    Spouse name: Not on file   Number of children: Not on file   Years of education: Not on file   Highest education level: Not on file  Occupational History   Not on file  Tobacco Use   Smoking status: Every Day    Current packs/day: 1.00    Average packs/day: 1 pack/day for 41.0 years (41.0 ttl pk-yrs)    Types: Cigarettes    Passive exposure: Current   Smokeless tobacco: Never   Tobacco comments:    Since age 54  Vaping Use   Vaping status: Never Used  Substance and Sexual Activity   Alcohol use: No    Alcohol/week: 0.0 standard drinks of alcohol   Drug use: No   Sexual activity: Yes  Other Topics Concern   Not on file  Social History Narrative   Not on file   Social Drivers of Health   Tobacco Use: High Risk (05/01/2024)   Patient History     Smoking Tobacco Use: Every Day    Smokeless Tobacco Use: Never    Passive Exposure: Current  Financial Resource Strain: Low Risk (03/26/2023)   Overall Financial Resource Strain (CARDIA)    Difficulty of Paying Living Expenses: Not hard at all  Food Insecurity: No Food Insecurity (03/29/2024)   Epic    Worried About Radiation Protection Practitioner of Food in the Last Year: Never true    Ran Out of Food in the Last Year: Never true  Transportation Needs: No Transportation Needs (03/29/2024)   Epic    Lack of Transportation (Medical): No    Lack of Transportation (Non-Medical): No  Physical Activity: Inactive (03/26/2023)   Exercise Vital Sign    Days of Exercise per Week: 0 days    Minutes of Exercise per Session: 0 min  Stress: No Stress Concern Present (03/26/2023)   [employer] of Occupational Health - Occupational Stress Questionnaire    Feeling of Stress : Not at all  Social Connections: Socially Isolated (03/26/2023)   Social Connection and Isolation Panel    Frequency of Communication with Friends and Family: Never    Frequency of Social Gatherings with Friends and Family: Never    Attends Religious Services: Never    Database Administrator or Organizations: No    Attends Banker Meetings: Never    Marital Status: Married  Catering Manager Violence: Not At Risk (03/29/2024)   Epic    Fear of Current or Ex-Partner: No    Emotionally Abused: No    Physically Abused: No    Sexually Abused: No  Depression (PHQ2-9): Low Risk (02/27/2024)   Depression (PHQ2-9)    PHQ-2 Score: 0  Alcohol Screen: Low Risk (03/26/2023)   Alcohol Screen    Last Alcohol Screening Score (AUDIT): 0  Housing: Low Risk (03/29/2024)   Epic    Unable to Pay for Housing in the Last Year: No    Number of Times Moved in the Last Year: 0    Homeless in the Last Year: No  Utilities: Not At Risk (03/29/2024)   Epic    Threatened with loss of utilities: No  Health Literacy: Not on file     Review  of Systems   Gen: Denies any fever, chills, fatigue, weight loss, lack of appetite.  CV: Denies chest pain, heart palpitations, peripheral edema, syncope.  Resp: Denies shortness of breath at rest or with exertion. Denies wheezing or  cough.  GI: Denies dysphagia or odynophagia. Denies jaundice, hematemesis, fecal incontinence. GU : Denies urinary burning, urinary frequency, urinary hesitancy MS: Denies joint pain, muscle weakness, cramps, or limitation of movement.  Derm: Denies rash, itching, dry skin Psych: Denies depression, anxiety, memory loss, and confusion Heme: Denies bruising, bleeding, and enlarged lymph nodes.   Physical Exam   BP (!) 148/92   Pulse 75   Temp 98.6 F (37 C)   Ht 5' 1 (1.549 m)   Wt 132 lb 6.4 oz (60.1 kg)   BMI 25.02 kg/m  General:   Alert and oriented. Pleasant and cooperative. Well-nourished and well-developed.  Head:  Normocephalic and atraumatic. Eyes:  Without icterus Ears:  Normal auditory acuity. Lungs:  Clear to auscultation bilaterally.  Heart:  S1, S2 present without murmurs appreciated.  Abdomen:  +BS, soft, non-tender and non-distended. No HSM noted. No guarding or rebound. No masses appreciated.  Rectal:  Deferred  Msk:  Symmetrical without gross deformities. Normal posture. Extremities:  Without edema. Neurologic:  Alert and  oriented x4;  grossly normal neurologically. Skin:  Intact without significant lesions or rashes. Psych:  Alert and cooperative. Normal mood and affect.   Assessment/Plan:    Amanda York is a 59 y.o. female presenting today with positive Cologuard recently but no lower or upper GI signs/symptoms of concern. No prior colonoscopy, and she has no FH colon cancer or polyps.   Will arrange colonoscopy in the near future with Dr. Shaaron. Discussed risks and benefits with stated understanding.   Further recommendations to follow.    Therisa MICAEL Stager, PhD, ANP-BC [facility]    "

## 2024-05-05 ENCOUNTER — Encounter (HOSPITAL_COMMUNITY)
Admission: RE | Admit: 2024-05-05 | Discharge: 2024-05-05 | Disposition: A | Source: Ambulatory Visit | Attending: Internal Medicine

## 2024-05-06 ENCOUNTER — Encounter (HOSPITAL_COMMUNITY): Payer: Self-pay

## 2024-05-07 ENCOUNTER — Encounter (HOSPITAL_COMMUNITY): Payer: Self-pay | Admitting: Internal Medicine

## 2024-05-07 ENCOUNTER — Ambulatory Visit (HOSPITAL_COMMUNITY)
Admission: RE | Admit: 2024-05-07 | Discharge: 2024-05-07 | Disposition: A | Discharge status: Home / Self Care | Source: Home / Self Care | Attending: Internal Medicine | Admitting: Internal Medicine

## 2024-05-07 ENCOUNTER — Other Ambulatory Visit: Payer: Self-pay

## 2024-05-07 ENCOUNTER — Ambulatory Visit (HOSPITAL_COMMUNITY): Admitting: Anesthesiology

## 2024-05-07 ENCOUNTER — Encounter (HOSPITAL_COMMUNITY)
Admission: RE | Disposition: A | Discharge status: Home / Self Care | Payer: Self-pay | Source: Home / Self Care | Attending: Internal Medicine

## 2024-05-07 MED ORDER — LACTATED RINGERS IV SOLN: INTRAVENOUS | Status: DC | PRN

## 2024-05-07 MED ORDER — LIDOCAINE HCL (CARDIAC) PF 100 MG/5ML IV SOSY
PREFILLED_SYRINGE | INTRAVENOUS | Status: DC | PRN
  Administered 2024-05-07: 30 mg via INTRAVENOUS

## 2024-05-07 MED ORDER — PROPOFOL 10 MG/ML IV BOLUS
INTRAVENOUS | Status: DC | PRN
  Administered 2024-05-07: 100 mg via INTRAVENOUS
  Administered 2024-05-07: 150 ug/kg/min via INTRAVENOUS

## 2024-05-07 NOTE — Interval H&P Note (Signed)
 History and Physical Interval Note:  05/07/2024 12:14 PM  Amanda York  has presented today for surgery, with the diagnosis of positive cologuard.  The various methods of treatment have been discussed with the patient and family. After consideration of risks, benefits and other options for treatment, the patient has consented to  Procedures with comments: COLONOSCOPY (N/A) - 1:00pm, asa 3 as a surgical intervention.  The patient's history has been reviewed, patient examined, no change in status, stable for surgery.  I have reviewed the patient's chart and labs.  Questions were answered to the patient's satisfaction.     Lamar Hollingshead  Patient seen and examined.  Agree with need for diagnostic colonoscopy given positive Cologuard.  The risks, benefits, limitations, alternatives and imponderables have been reviewed with the patient. Questions have been answered. All parties are agreeable.  No change.

## 2024-05-07 NOTE — Op Note (Signed)
 [facility] Patient Name: Amanda York Procedure Date: 05/07/2024 11:21 AM MRN: [id number] Date of Birth: [birth year] Attending MD: Lamar Ozell Hollingshead , MD, [id number] CSN: [id number] Age: 59 Admit Type: Outpatient Procedure:                Colonoscopy Indications:              Positive Cologuard test Providers:                Lamar Ozell Hollingshead, MD, Madelin Hunter, RN, Jon RONAL Collet RN, RN, Bascom Blush Referring MD:              Medicines:                Propofol  per Anesthesia Complications:            No immediate complications. Estimated Blood Loss:     Estimated blood loss was minimal. Procedure:                Pre-Anesthesia Assessment:                           - Prior to the procedure, a History and Physical                            was performed, and patient medications and                            allergies were reviewed. The patient's tolerance of                            previous anesthesia was also reviewed. The risks                            and benefits of the procedure and the sedation                            options and risks were discussed with the patient.                            All questions were answered, and informed consent                            was obtained. Prior Anticoagulants: The patient has                            taken no anticoagulant or antiplatelet agents. ASA                            Grade Assessment: III - A patient with severe                            systemic disease. After reviewing the risks and  benefits, the patient was deemed in satisfactory                            condition to undergo the procedure.                           After obtaining informed consent, the colonoscope                            was passed under direct vision. Throughout the                            procedure, the patient's blood pressure, pulse, and                             oxygen saturations were monitored continuously. The                            [id number] ([id number]) Colon was introduced through                            the anus and advanced to the the cecum, identified                            by appendiceal orifice and ileocecal valve. The                            colonoscopy was performed without difficulty. The                            patient tolerated the procedure well. The quality                            of the bowel preparation was adequate. The                            ileocecal valve, appendiceal orifice, and rectum                            were photographed. The colonoscopy was performed                            without difficulty. The patient tolerated the                            procedure well. The quality of the bowel                            preparation was adequate. The entire colon was well                            visualized. Scope In: 12:24:32 PM Scope Out: 12:42:29 PM Scope Withdrawal Time: 0 hours 12 minutes 44 seconds  Total Procedure Duration: 0 hours 17 minutes 57 seconds  Findings:      The perianal and digital rectal examinations were normal.      Multiple large-mouthed and medium-mouthed diverticula were found in the       sigmoid colon and descending colon.      Two sessile polyps were found in the recto-sigmoid colon and ascending       colon. The polyps were 4 to 6 mm in size. These polyps were removed with       a cold snare. Resection and retrieval were complete. Estimated blood       loss was minimal. (5) diminutive polyps in the rectosigmoid that were       ablated with tip hot snare cautery.      The exam was otherwise without abnormality on direct and retroflexion       views. Impression:               - Diverticulosis in the sigmoid colon and in the                            descending colon.                           - Two 4 to 6 mm polyps at the recto-sigmoid colon                             and in the ascending colon, removed with a cold                            snare. Resected and retrieved. (5) diminutive                            rectosigmoid polyps ablated as described above                           - The examination was otherwise normal on direct                            and retroflexion views. Moderate Sedation:      Moderate (conscious) sedation was personally administered by an       anesthesia professional. The following parameters were monitored: oxygen       saturation, heart rate, blood pressure, respiratory rate, EKG, adequacy       of pulmonary ventilation, and response to care. Recommendation:           - Patient has a contact number available for                            emergencies. The signs and symptoms of potential                            delayed complications were discussed with the                            patient. Return to normal activities tomorrow.  Written discharge instructions were provided to the                            patient.                           - Advance diet as tolerated.                           - Continue present medications.                           - Repeat colonoscopy date to be determined after                            pending pathology results are reviewed for                            surveillance.                           - Return to GI office (date not yet determined). Procedure Code(s):        --- Professional ---                           [phone], Colonoscopy, flexible; with removal of                            tumor(s), polyp(s), or other lesion(s) by snare                            technique Diagnosis Code(s):        --- Professional ---                           D12.7, Benign neoplasm of rectosigmoid junction                           D12.2, Benign neoplasm of ascending colon                           R19.5, Other fecal abnormalities                            K57.30, Diverticulosis of large intestine without                            perforation or abscess without bleeding CPT copyright 2022 [employer]. All rights reserved. The codes documented in this report are preliminary and upon coder review may  be revised to meet current compliance requirements. Lamar HERO. Zakee Deerman, MD Lamar Ozell Hollingshead, MD 05/07/2024 1:18:04 PM This report has been signed electronically. Number of Addenda: 0

## 2024-05-07 NOTE — Anesthesia Preprocedure Evaluation (Signed)
"                                    Anesthesia Evaluation  Patient identified by MRN, date of birth, ID band Patient awake    Reviewed: Allergy & Precautions, H&P , NPO status , Patient's Chart, lab work & pertinent test results, reviewed documented beta blocker date and time   Airway Mallampati: II  TM Distance: >3 FB Neck ROM: full    Dental no notable dental hx.    Pulmonary COPD, Current Smoker and Patient abstained from smoking.   Pulmonary exam normal breath sounds clear to auscultation       Cardiovascular Exercise Tolerance: Good hypertension,  Rhythm:regular Rate:Normal     Neuro/Psych  PSYCHIATRIC DISORDERS Anxiety Depression    negative neurological ROS     GI/Hepatic negative GI ROS, Neg liver ROS,,,  Endo/Other  Hypothyroidism    Renal/GU negative Renal ROS  negative genitourinary   Musculoskeletal   Abdominal   Peds  Hematology negative hematology ROS (+)   Anesthesia Other Findings   Reproductive/Obstetrics negative OB ROS                              Anesthesia Physical Anesthesia Plan  ASA: 3  Anesthesia Plan: MAC   Post-op Pain Management:    Induction:   PONV Risk Score and Plan: Propofol  infusion  Airway Management Planned:   Additional Equipment:   Intra-op Plan:   Post-operative Plan:   Informed Consent: I have reviewed the patients History and Physical, chart, labs and discussed the procedure including the risks, benefits and alternatives for the proposed anesthesia with the patient or authorized representative who has indicated his/her understanding and acceptance.     Dental Advisory Given  Plan Discussed with: CRNA  Anesthesia Plan Comments:         Anesthesia Quick Evaluation  "

## 2024-05-07 NOTE — Transfer of Care (Signed)
 Immediate Anesthesia Transfer of Care Note  Patient: Amanda York  Procedure(s) Performed: COLONOSCOPY  Patient Location: Short Stay  Anesthesia Type:MAC  Level of Consciousness: drowsy, patient cooperative, and responds to stimulation  Airway & Oxygen Therapy: Patient Spontanous Breathing  Post-op Assessment: Report given to RN and Post -op Vital signs reviewed and stable  Post vital signs: Reviewed and stable  Last Vitals:  Vitals Value Taken Time  BP 96/63 05/07/24 12:46  Temp 36.5 C 05/07/24 12:46  Pulse 80 05/07/24 12:46  Resp 18 05/07/24 12:46  SpO2 100 % 05/07/24 12:46    Last Pain:  Vitals:   05/07/24 1246  TempSrc: Oral  PainSc: 0-No pain      Patients Stated Pain Goal: 8 (05/07/24 1246)  Complications: No notable events documented.

## 2024-05-07 NOTE — Discharge Instructions (Addendum)
" °  Colonoscopy Discharge Instructions  Read the instructions outlined below and refer to this sheet in the next few weeks. These discharge instructions provide you with general information on caring for yourself after you leave the hospital. Your doctor may also give you specific instructions. While your treatment has been planned according to the most current medical practices available, unavoidable complications occasionally occur. If you have any problems or questions after discharge, call Dr. Shaaron at [phone]. ACTIVITY You may resume your regular activity, but move at a slower pace for the next 24 hours.  Take frequent rest periods for the next 24 hours.  Walking will help get rid of the air and reduce the bloated feeling in your belly (abdomen).  No driving for 24 hours (because of the medicine (anesthesia) used during the test).   Do not sign any important legal documents or operate any machinery for 24 hours (because of the anesthesia used during the test).  NUTRITION Drink plenty of fluids.  You may resume your normal diet as instructed by your doctor.  Begin with a light meal and progress to your normal diet. Heavy or fried foods are harder to digest and may make you feel sick to your stomach (nauseated).  Avoid alcoholic beverages for 24 hours or as instructed.  MEDICATIONS You may resume your normal medications unless your doctor tells you otherwise.  WHAT YOU CAN EXPECT TODAY Some feelings of bloating in the abdomen.  Passage of more gas than usual.  Spotting of blood in your stool or on the toilet paper.  IF YOU HAD POLYPS REMOVED DURING THE COLONOSCOPY: No aspirin  products for 7 days or as instructed.  No alcohol for 7 days or as instructed.  Eat a soft diet for the next 24 hours.  FINDING OUT THE RESULTS OF YOUR TEST Not all test results are available during your visit. If your test results are not back during the visit, make an appointment with your caregiver to find out the  results. Do not assume everything is normal if you have not heard from your caregiver or the medical facility. It is important for you to follow up on all of your test results.  SEEK IMMEDIATE MEDICAL ATTENTION IF: You have more than a spotting of blood in your stool.  Your belly is swollen (abdominal distention).  You are nauseated or vomiting.  You have a temperature over 101.  You have abdominal pain or discomfort that is severe or gets worse throughout the day.      2 polyps found and removed 5 tiny polyps in the rectum ablated with heat.  Diverticulosis present.  Further recommendations to follow pending review of pathology report  At patient request, I called Miguel at [phone]-call rolled to voicemail.  I left a message.  "

## 2024-05-08 ENCOUNTER — Ambulatory Visit: Payer: Self-pay | Admitting: Internal Medicine

## 2024-05-08 ENCOUNTER — Encounter (HOSPITAL_COMMUNITY): Payer: Self-pay | Admitting: Internal Medicine

## 2024-05-08 LAB — SURGICAL PATHOLOGY

## 2024-05-08 NOTE — Anesthesia Postprocedure Evaluation (Signed)
"   Anesthesia Post Note  Patient: Amanda York  Procedure(s) Performed: COLONOSCOPY  Patient location during evaluation: Phase II Anesthesia Type: MAC Level of consciousness: awake Pain management: pain level controlled Vital Signs Assessment: post-procedure vital signs reviewed and stable Respiratory status: spontaneous breathing and respiratory function stable Cardiovascular status: blood pressure returned to baseline and stable Postop Assessment: no headache and no apparent nausea or vomiting Anesthetic complications: no Comments: Late entry   No notable events documented.   Last Vitals:  Vitals:   05/07/24 1127 05/07/24 1246  BP: (!) 148/96 96/63  Pulse: 83 80  Resp: 17 18  Temp: 36.9 C 36.5 C  SpO2: 98% 100%    Last Pain:  Vitals:   05/08/24 1225  TempSrc:   PainSc: 0-No pain                 Amanda York      "

## 2024-05-28 ENCOUNTER — Ambulatory Visit: Admitting: Gastroenterology

## 2024-06-03 ENCOUNTER — Other Ambulatory Visit

## 2024-08-29 ENCOUNTER — Ambulatory Visit: Admitting: Family Medicine
